# Patient Record
Sex: Male | Born: 1965 | Race: White | Hispanic: No | Marital: Single | Smoking: Current every day smoker
Health system: Southern US, Community
[De-identification: ages and names within clinical notes are randomized; demographics above are authoritative.]

## PROBLEM LIST (undated history)

## (undated) DIAGNOSIS — I6529 Occlusion and stenosis of unspecified carotid artery: Secondary | ICD-10-CM

## (undated) DIAGNOSIS — K219 Gastro-esophageal reflux disease without esophagitis: Secondary | ICD-10-CM

## (undated) DIAGNOSIS — I639 Cerebral infarction, unspecified: Secondary | ICD-10-CM

## (undated) DIAGNOSIS — N200 Calculus of kidney: Secondary | ICD-10-CM

## (undated) DIAGNOSIS — Z720 Tobacco use: Secondary | ICD-10-CM

## (undated) DIAGNOSIS — I10 Essential (primary) hypertension: Secondary | ICD-10-CM

## (undated) DIAGNOSIS — Z87442 Personal history of urinary calculi: Secondary | ICD-10-CM

## (undated) HISTORY — PX: UMBILICAL HERNIA REPAIR: SHX196

## (undated) HISTORY — DX: Cerebral infarction, unspecified: I63.9

## (undated) HISTORY — DX: Calculus of kidney: N20.0

## (undated) HISTORY — DX: Tobacco use: Z72.0

## (undated) HISTORY — DX: Occlusion and stenosis of unspecified carotid artery: I65.29

---

## 1998-09-14 ENCOUNTER — Observation Stay (HOSPITAL_COMMUNITY): Admission: RE | Admit: 1998-09-14 | Discharge: 1998-09-15 | Payer: Self-pay | Admitting: *Deleted

## 2013-07-06 ENCOUNTER — Other Ambulatory Visit: Payer: Self-pay | Admitting: Internal Medicine

## 2013-07-06 DIAGNOSIS — R1031 Right lower quadrant pain: Secondary | ICD-10-CM

## 2013-07-08 ENCOUNTER — Ambulatory Visit
Admission: RE | Admit: 2013-07-08 | Discharge: 2013-07-08 | Disposition: A | Discharge status: Home / Self Care | Payer: PRIVATE HEALTH INSURANCE | Source: Ambulatory Visit | Attending: Internal Medicine | Admitting: Internal Medicine

## 2013-07-08 DIAGNOSIS — R1031 Right lower quadrant pain: Secondary | ICD-10-CM

## 2013-07-08 MED ORDER — IOHEXOL 300 MG/ML  SOLN
100.0000 mL | Freq: Once | INTRAMUSCULAR | Status: AC | PRN
Start: 2013-07-08 — End: 2013-07-08
  Administered 2013-07-08: 100 mL via INTRAVENOUS

## 2013-07-10 ENCOUNTER — Encounter: Payer: Self-pay | Admitting: Internal Medicine

## 2013-07-28 ENCOUNTER — Other Ambulatory Visit: Payer: Self-pay | Admitting: Urology

## 2013-08-21 NOTE — Patient Instructions (Signed)
Landis Gandydward D Fedorko  08/21/2013   Your procedure is scheduled on: 09/07/2013  1200noon-328pm   [facility]  Follow the Signs to Short Stay Center at   1000     am  Call this number if you have problems the morning of surgery: [phone]   Remember:   Do not eat food or drink liquids after midnight.   Take these medicines the morning of surgery with A SIP OF WATER:    Do not wear jewelry,  Do not wear lotions, powders, or perfumes.  . Men may shave face and neck.  Do not bring valuables to the hospital.  Contacts, dentures or bridgework may not be worn into surgery.  Leave suitcase in the car. After surgery it may be brought to your room.  For patients admitted to the hospital, checkout time is 11:00 AM the day of  discharge.      [facility] - Preparing for Surgery Before surgery, you can play an important role.  Because skin is not sterile, your skin needs to be as free of germs as possible.  You can reduce the number of germs on your skin by washing with CHG (chlorahexidine gluconate) soap before surgery.  CHG is an antiseptic cleaner which kills germs and bonds with the skin to continue killing germs even after washing. Please DO NOT use if you have an allergy to CHG or antibacterial soaps.  If your skin becomes reddened/irritated stop using the CHG and inform your nurse when you arrive at Short Stay. Do not shave (including legs and underarms) for at least 48 hours prior to the first CHG shower.  You may shave your face/neck. Please follow these instructions carefully:  1.  Shower with CHG Soap the night before surgery and the  morning of Surgery.  2.  If you choose to wash your hair, wash your hair first as usual with your  normal  shampoo.  3.  After you shampoo, rinse your hair and body thoroughly to remove the  shampoo.                           4.  Use CHG as you would any other liquid soap.  You can apply chg directly  to the skin and wash   Gently with a scrungie or clean washcloth.  5.  Apply the CHG Soap to your body ONLY FROM THE NECK DOWN.   Do not use on face/ open                           Wound or open sores. Avoid contact with eyes, ears mouth and genitals (private parts).                       Wash face,  Genitals (private parts) with your normal soap.             6.  Wash thoroughly, paying special attention to the area where your surgery  will be performed.  7.  Thoroughly rinse your body with warm water from the neck down.  8.  DO NOT shower/wash with your normal soap after using and rinsing off  the CHG Soap.                9.  Pat yourself dry with a clean towel.  10.  Wear clean pajamas.            11.  Place clean sheets on your bed the night of your first shower and do not  sleep with pets. Day of Surgery : Do not apply any lotions/deodorants the morning of surgery.  Please wear clean clothes to the hospital/surgery center.  FAILURE TO FOLLOW THESE INSTRUCTIONS MAY RESULT IN THE CANCELLATION OF YOUR SURGERY PATIENT SIGNATURE_________________________________  NURSE SIGNATURE__________________________________  ________________________________________________________________________

## 2013-08-24 ENCOUNTER — Ambulatory Visit (INDEPENDENT_AMBULATORY_CARE_PROVIDER_SITE_OTHER): Payer: PRIVATE HEALTH INSURANCE | Admitting: Internal Medicine

## 2013-08-24 ENCOUNTER — Other Ambulatory Visit: Payer: Self-pay

## 2013-08-24 ENCOUNTER — Encounter (HOSPITAL_COMMUNITY)
Admission: RE | Admit: 2013-08-24 | Discharge: 2013-08-24 | Disposition: A | Discharge status: Home / Self Care | Payer: PRIVATE HEALTH INSURANCE | Source: Ambulatory Visit | Attending: Urology | Admitting: Urology

## 2013-08-24 ENCOUNTER — Encounter (HOSPITAL_COMMUNITY): Payer: Self-pay

## 2013-08-24 ENCOUNTER — Encounter (INDEPENDENT_AMBULATORY_CARE_PROVIDER_SITE_OTHER): Payer: Self-pay

## 2013-08-24 ENCOUNTER — Encounter: Payer: Self-pay | Admitting: Internal Medicine

## 2013-08-24 ENCOUNTER — Encounter (HOSPITAL_COMMUNITY): Payer: Self-pay | Admitting: Pharmacy Technician

## 2013-08-24 VITALS — BP 150/100 | HR 100 | Ht 64.0 in | Wt 159.1 lb

## 2013-08-24 DIAGNOSIS — K648 Other hemorrhoids: Secondary | ICD-10-CM

## 2013-08-24 DIAGNOSIS — R030 Elevated blood-pressure reading, without diagnosis of hypertension: Secondary | ICD-10-CM

## 2013-08-24 DIAGNOSIS — Z01818 Encounter for other preprocedural examination: Secondary | ICD-10-CM | POA: Insufficient documentation

## 2013-08-24 DIAGNOSIS — IMO0001 Reserved for inherently not codable concepts without codable children: Secondary | ICD-10-CM

## 2013-08-24 DIAGNOSIS — N200 Calculus of kidney: Secondary | ICD-10-CM

## 2013-08-24 DIAGNOSIS — R1031 Right lower quadrant pain: Secondary | ICD-10-CM

## 2013-08-24 DIAGNOSIS — Z01812 Encounter for preprocedural laboratory examination: Secondary | ICD-10-CM | POA: Insufficient documentation

## 2013-08-24 HISTORY — DX: Gastro-esophageal reflux disease without esophagitis: K21.9

## 2013-08-24 LAB — CBC
HCT: 49.8 % (ref 39.0–52.0)
Hemoglobin: 17.4 g/dL — ABNORMAL HIGH (ref 13.0–17.0)
MCH: 33.7 pg (ref 26.0–34.0)
MCHC: 34.9 g/dL (ref 30.0–36.0)
MCV: 96.5 fL (ref 78.0–100.0)
PLATELETS: 223 10*3/uL (ref 150–400)
RBC: 5.16 MIL/uL (ref 4.22–5.81)
RDW: 12.1 % (ref 11.5–15.5)
WBC: 12.1 10*3/uL — ABNORMAL HIGH (ref 4.0–10.5)

## 2013-08-24 LAB — BASIC METABOLIC PANEL
BUN: 13 mg/dL (ref 6–23)
CO2: 23 mEq/L (ref 19–32)
Calcium: 9.2 mg/dL (ref 8.4–10.5)
Chloride: 107 mEq/L (ref 96–112)
Creatinine, Ser: 0.93 mg/dL (ref 0.50–1.35)
Glucose, Bld: 121 mg/dL — ABNORMAL HIGH (ref 70–99)
POTASSIUM: 4.3 meq/L (ref 3.7–5.3)
Sodium: 143 mEq/L (ref 137–147)

## 2013-08-24 MED ORDER — HYDROCORTISONE ACETATE 25 MG RE SUPP: 25.0000 mg | Freq: Every day | RECTAL | Status: DC

## 2013-08-24 NOTE — Patient Instructions (Addendum)
You have been given a separate informational sheet regarding your tobacco use, the importance of quitting and local resources to help you quit.  Please pick up the hemorrhoid suppository prescription at your pharmacy.  Call to make an appointment to see me (I wold like to see you in August) after you have your kidney stone surgery.  I think you should have the hemorrhoids banded and you might need a colonoscopy if banding does not stop the bleeding.  Your blood pressure was high today - could be from pain. Please follow-up with your regular doctor about this.  I appreciate the opportunity to care for you. Iva Booparl E. Gessner, MD, [facility]  Hemorrhoids  Hemorrhoids are swollen veins around the rectum or anus. There are two types of hemorrhoids:  Internal hemorrhoids. These occur in the veins just inside the rectum. They may poke through to the outside and become irritated and painful.  External hemorrhoids. These occur in the veins outside the anus and can be felt as a painful swelling or hard lump near the anus. CAUSES  Pregnancy.  Obesity.  Constipation or diarrhea.  Straining to have a bowel movement.  Sitting for long periods on the toilet.  Heavy lifting or other activity that caused you to strain.  Anal intercourse. SYMPTOMS  Pain.  Anal itching or irritation.  Rectal bleeding.  Fecal leakage.  Anal swelling.  One or more lumps around the anus.  DIAGNOSIS  Your caregiver may be able to diagnose hemorrhoids by visual examination. Other examinations or tests that may be performed include:  Examination of the rectal area with a gloved hand (digital rectal exam).  Examination of anal canal using a small tube (scope).  A blood test if you have lost a significant amount of blood.  A test to look inside the colon (sigmoidoscopy or colonoscopy). TREATMENT  Most hemorrhoids can be treated at home. However, if symptoms do not seem to be getting better or if you have a lot of rectal  bleeding, your caregiver may perform a procedure to help make the hemorrhoids get smaller or remove them completely. Possible treatments include:  Placing a rubber band at the base of the hemorrhoid to cut off the circulation (rubber band ligation).  Injecting a chemical to shrink the hemorrhoid (sclerotherapy).  Using a tool to burn the hemorrhoid (infrared light therapy).  Surgically removing the hemorrhoid (hemorrhoidectomy).  Stapling the hemorrhoid to block blood flow to the tissue (hemorrhoid stapling).  HOME CARE INSTRUCTIONS  Eat foods with fiber, such as whole grains, beans, nuts, fruits, and vegetables. Ask your doctor about taking products with added fiber in them (fiber supplements).  Increase fluid intake. Drink enough water and fluids to keep your urine clear or pale yellow.  Exercise regularly.  Go to the bathroom when you have the urge to have a bowel movement. Do not wait.  Avoid straining to have bowel movements.  Keep the anal area dry and clean. Use wet toilet paper or moist towelettes after a bowel movement.  Medicated creams and suppositories may be used or applied as directed.  Only take over-the-counter or prescription medicines as directed by your caregiver.  Take warm sitz baths for 15-20 minutes, 3-4 times a day to ease pain and discomfort.  Place ice packs on the hemorrhoids if they are tender and swollen. Using ice packs between sitz baths may be helpful.  Put ice in a plastic bag.  Place a towel between your skin and the bag.  Leave the ice  on for 15-20 minutes, 3-4 times a day.  Do not use a donut-shaped pillow or sit on the toilet for long periods. This increases blood pooling and pain.  SEEK MEDICAL CARE IF:  You have increasing pain and swelling that is not controlled by treatment or medicine.  You have uncontrolled bleeding.  You have difficulty or you are unable to have a bowel movement.  You have pain or inflammation outside the area of the  hemorrhoids. MAKE SURE YOU:  Understand these instructions.  Will watch your condition.  Will get help right away if you are not doing well or get worse. Document Released: 02/17/2000 Document Revised: 02/06/2012 Document Reviewed: 12/25/2011  [facility] Patient Information 2015 [employer] [place]. This information is not intended to replace advice given to you by your health care Donavin Audino. Make sure you discuss any questions you have with your health care Jersee Winiarski.

## 2013-08-24 NOTE — Progress Notes (Signed)
CBC results faxed via EPIC to Dr Berneice HeinrichManny,

## 2013-08-24 NOTE — Progress Notes (Addendum)
At beginning of preop appointment blood pressure was 172/120.  At end of preop visit blood pressure was 170/105.  Patient states when he has been to doctors before blood pressure is elevated.  " No one has ever put me on any blood pressure medication. ".  Patient states blood pressure has been 180's /130s.   Patient does not have a PCP.   Blood pressure after venipuncture was 170/105.  After patient was placed in a room with the lights off and quiet- blood pressure was 181/108 in right arm and 174/112 in left arm.  Patient was instructed to let Dr Leone PayorGessner 's office be aware of elevated blood pressure readings and to go to the Emergency Room if develops severe headache or has any dizziness or lightheadedness or any change in present status.

## 2013-08-24 NOTE — Progress Notes (Signed)
Subjective:    Patient ID: [name] [name], male    DOB: 11/11/1965, 48 y.o.   MRN: [id number]  HPI The patient is a long history of right lower quadrant pain. It's been getting worse. He is a staghorn calculus of the right kidney. He is due for a procedure to treat that. He also has nausea vomiting and diarrhea with this at times. He has some chronic intermittent rectal bleeding and he is protruding hemorrhoids. 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  He saw his primary care physician in may, he was describing more diarrhea at that time and he is now. He will get the spells of febrile right lower quadrant pain, him times feverish and chills as well. I reviewed Dr. Alphonsus SiasHolwerda's note.  No Known Allergies Outpatient Prescriptions Prior to Visit  Medication Sig Dispense Refill  . ibuprofen (ADVIL,MOTRIN) 200 MG tablet Take 400 mg by mouth every 6 (six) hours as needed (Pain).      Marland Kitchen. oxyCODONE-acetaminophen (PERCOCET/ROXICET) 5-325 MG per tablet Take 1 tablet by mouth every 4 (four) hours as needed for severe pain (Pain).      Marland Kitchen. tetrahydrozoline 0.05 % ophthalmic solution Place 1 drop into both eyes as needed (Eye allergies).       No facility-administered medications prior to visit.   Past Medical History  Diagnosis Date  . Tobacco abuse   . Nephrolithiasis   . GERD (gastroesophageal reflux disease)    Past Surgical History  Procedure Laterality Date  . Umbilical hernia repair     History   Social History  . Marital Status: Single         Number of Children: 0   Occupational History  . rigging    Social History Main Topics  . Smoking status: Current Every Day Smoker -- 1.00 packs/day for 25 years    Types: Cigarettes  . Smokeless tobacco: Never Used  . Alcohol Use: Yes     Comment: 12 mixed drinks per week   . 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 Prostate cancer Father 372  . Lung cancer Father   . Nephrolithiasis Father   . Ovarian cancer Mother   . CVA Paternal Grandmother     in her 5950's  . Cancer Paternal Grandmother     eye  . Diabetes Maternal Grandmother   . Alcoholism Maternal Uncle     x 3        Review of Systems As per history of present illness. All other review of systems are negative.    Objective:   Physical Exam General:  Well-developed, well-nourished and in no acute distress ENT:   Mouth and posterior pharynx free of lesions.  Neck:   supple w/o thyromegaly or mass.  Lungs: Clear to auscultation bilaterally. Heart:  S1S2, no rubs, murmurs, gallops. Abdomen:  soft, non-tender, no hepatosplenomegaly, hernia, or mass and BS+.  Rectal: Normal anoderm, no mass, normal resting tone, voluntary squeeze. Simulate defecation appropriate. Lymph:  no cervical or supraclavicular adenopathy. Extremities:   no edema Skin   no rash. Neuro:  A&O x 3.  Psych:  appropriate  mood and  Affect.   Anoscopy was performed with the patient in the left lateral decubitus position and revealed Grade 2 internal hemorrhoids all 3 positions. Data Reviewed: PCP note, labs, CT in EMR    Assessment & Plan:   1. Hemorrhoids, internal, with bleeding and Grade 2 prolapse   I believe these are source of rectal bleeding. Byproduct of his heavy lifting on his job.  I have recommended Anusol-HC suppositories for the time being.  Subsequent hemorrhoidal ligation after he has to staghorn calculus procedure.  I've explained that if the bleeding does not resolve with treatment of the hemorrhoids and a colonoscopy will be needed. He understands the importance of followup I've asked him to followup in August. He wants to call to schedule because he travels for work recurrently.   2. RLQ abdominal  pain   3. Staghorn calculus   I think the right lower quadrant pain  may be related to his staghorn calculus. Await procedure for that and followup. May need colonoscopy depending upon response to Tx of stone.  4. Elevated blood pressure   Recheck was improved. He will follow with primary care.    I appreciate the opportunity to care for this patient.Cc:. Dr. Lianne Morisheo [name], Alysia PennaHOLWERDA, SCOTT, MD

## 2013-09-06 MED ORDER — GENTAMICIN SULFATE 40 MG/ML IJ SOLN
360.0000 mg | INTRAVENOUS | Status: AC
  Administered 2013-09-07: 360 mg via INTRAVENOUS
  Filled 2013-09-06: qty 9

## 2013-09-07 ENCOUNTER — Inpatient Hospital Stay (HOSPITAL_COMMUNITY)
Admission: RE | Admit: 2013-09-07 | Discharge: 2013-09-10 | DRG: 661 | Disposition: A | Discharge status: Home / Self Care | Payer: PRIVATE HEALTH INSURANCE | Source: Ambulatory Visit | Attending: Urology | Admitting: Urology

## 2013-09-07 ENCOUNTER — Encounter (HOSPITAL_COMMUNITY): Payer: PRIVATE HEALTH INSURANCE | Admitting: Anesthesiology

## 2013-09-07 ENCOUNTER — Inpatient Hospital Stay (HOSPITAL_COMMUNITY): Payer: PRIVATE HEALTH INSURANCE

## 2013-09-07 ENCOUNTER — Inpatient Hospital Stay (HOSPITAL_COMMUNITY): Payer: PRIVATE HEALTH INSURANCE | Admitting: Anesthesiology

## 2013-09-07 ENCOUNTER — Encounter (HOSPITAL_COMMUNITY)
Admission: RE | Disposition: A | Discharge status: Home / Self Care | Payer: Self-pay | Source: Ambulatory Visit | Attending: Urology

## 2013-09-07 ENCOUNTER — Encounter (HOSPITAL_COMMUNITY): Payer: Self-pay | Admitting: *Deleted

## 2013-09-07 DIAGNOSIS — Z87442 Personal history of urinary calculi: Secondary | ICD-10-CM

## 2013-09-07 DIAGNOSIS — Z833 Family history of diabetes mellitus: Secondary | ICD-10-CM

## 2013-09-07 DIAGNOSIS — Z823 Family history of stroke: Secondary | ICD-10-CM

## 2013-09-07 DIAGNOSIS — Z01812 Encounter for preprocedural laboratory examination: Secondary | ICD-10-CM

## 2013-09-07 DIAGNOSIS — F17200 Nicotine dependence, unspecified, uncomplicated: Secondary | ICD-10-CM | POA: Diagnosis present

## 2013-09-07 DIAGNOSIS — I10 Essential (primary) hypertension: Secondary | ICD-10-CM | POA: Diagnosis present

## 2013-09-07 DIAGNOSIS — Z8042 Family history of malignant neoplasm of prostate: Secondary | ICD-10-CM

## 2013-09-07 DIAGNOSIS — N200 Calculus of kidney: Principal | ICD-10-CM | POA: Diagnosis present

## 2013-09-07 DIAGNOSIS — K219 Gastro-esophageal reflux disease without esophagitis: Secondary | ICD-10-CM | POA: Diagnosis present

## 2013-09-07 DIAGNOSIS — Z801 Family history of malignant neoplasm of trachea, bronchus and lung: Secondary | ICD-10-CM

## 2013-09-07 HISTORY — PX: HOLMIUM LASER APPLICATION: SHX5852

## 2013-09-07 HISTORY — PX: CYSTOSCOPY W/ URETERAL STENT PLACEMENT: SHX1429

## 2013-09-07 HISTORY — PX: NEPHROLITHOTOMY: SHX5134

## 2013-09-07 LAB — TYPE AND SCREEN
ABO/RH(D): O POS
ANTIBODY SCREEN: NEGATIVE

## 2013-09-07 LAB — ABO/RH: ABO/RH(D): O POS

## 2013-09-07 LAB — HEMOGLOBIN AND HEMATOCRIT, BLOOD
HCT: 46.2 % (ref 39.0–52.0)
Hemoglobin: 15.9 g/dL (ref 13.0–17.0)

## 2013-09-07 SURGERY — NEPHROLITHOTOMY PERCUTANEOUS
Anesthesia: General | Laterality: Right

## 2013-09-07 MED ORDER — ACETAMINOPHEN 500 MG PO TABS
1000.0000 mg | ORAL_TABLET | Freq: Three times a day (TID) | ORAL | Status: DC
  Administered 2013-09-07: 1000 mg via ORAL
  Filled 2013-09-07: qty 2

## 2013-09-07 MED ORDER — LORAZEPAM 2 MG/ML IJ SOLN
1.0000 mg | Freq: Four times a day (QID) | INTRAMUSCULAR | Status: DC | PRN
  Administered 2013-09-08: 1 mg via INTRAVENOUS
  Filled 2013-09-07: qty 1

## 2013-09-07 MED ORDER — MIDAZOLAM HCL 5 MG/5ML IJ SOLN
INTRAMUSCULAR | Status: DC | PRN
  Administered 2013-09-07: 2 mg via INTRAVENOUS

## 2013-09-07 MED ORDER — METOPROLOL TARTRATE 1 MG/ML IV SOLN
INTRAVENOUS | Status: AC
  Filled 2013-09-07: qty 5

## 2013-09-07 MED ORDER — KCL IN DEXTROSE-NACL 20-5-0.45 MEQ/L-%-% IV SOLN
INTRAVENOUS | Status: AC
  Filled 2013-09-07: qty 1000

## 2013-09-07 MED ORDER — PROPOFOL 10 MG/ML IV BOLUS
INTRAVENOUS | Status: DC | PRN
  Administered 2013-09-07: 200 mg via INTRAVENOUS

## 2013-09-07 MED ORDER — HYDROMORPHONE HCL PF 1 MG/ML IJ SOLN
INTRAMUSCULAR | Status: AC
  Administered 2013-09-08: 1 mg via INTRAVENOUS
  Filled 2013-09-07: qty 1

## 2013-09-07 MED ORDER — PNEUMOCOCCAL VAC POLYVALENT 25 MCG/0.5ML IJ INJ
0.5000 mL | INJECTION | INTRAMUSCULAR | Status: DC
  Filled 2013-09-07 (×2): qty 0.5

## 2013-09-07 MED ORDER — NEOSTIGMINE METHYLSULFATE 10 MG/10ML IV SOLN
INTRAVENOUS | Status: DC | PRN
  Administered 2013-09-07: 4 mg via INTRAVENOUS

## 2013-09-07 MED ORDER — GLYCOPYRROLATE 0.2 MG/ML IJ SOLN
INTRAMUSCULAR | Status: DC | PRN
  Administered 2013-09-07: 0.6 mg via INTRAVENOUS

## 2013-09-07 MED ORDER — FENTANYL CITRATE 0.05 MG/ML IJ SOLN
INTRAMUSCULAR | Status: AC
  Filled 2013-09-07: qty 2

## 2013-09-07 MED ORDER — GLYCOPYRROLATE 0.2 MG/ML IJ SOLN
INTRAMUSCULAR | Status: AC
  Filled 2013-09-07: qty 3

## 2013-09-07 MED ORDER — IOHEXOL 300 MG/ML  SOLN
INTRAMUSCULAR | Status: DC | PRN
  Administered 2013-09-07: 16 mL
  Administered 2013-09-07: 28 mL

## 2013-09-07 MED ORDER — PROMETHAZINE HCL 25 MG/ML IJ SOLN: 6.2500 mg | INTRAMUSCULAR | Status: DC | PRN

## 2013-09-07 MED ORDER — SODIUM CHLORIDE 0.9 % IJ SOLN
INTRAMUSCULAR | Status: AC
  Filled 2013-09-07: qty 10

## 2013-09-07 MED ORDER — KCL IN DEXTROSE-NACL 20-5-0.45 MEQ/L-%-% IV SOLN
INTRAVENOUS | Status: DC
  Administered 2013-09-07 – 2013-09-08 (×3): via INTRAVENOUS
  Administered 2013-09-09: 75 mL/h via INTRAVENOUS
  Administered 2013-09-09 – 2013-09-10 (×2): via INTRAVENOUS
  Filled 2013-09-07 (×7): qty 1000

## 2013-09-07 MED ORDER — LABETALOL HCL 5 MG/ML IV SOLN
INTRAVENOUS | Status: DC | PRN
  Administered 2013-09-07 (×2): 5 mg via INTRAVENOUS

## 2013-09-07 MED ORDER — NEOSTIGMINE METHYLSULFATE 10 MG/10ML IV SOLN
INTRAVENOUS | Status: AC
  Filled 2013-09-07: qty 1

## 2013-09-07 MED ORDER — LABETALOL HCL 5 MG/ML IV SOLN
INTRAVENOUS | Status: AC
  Filled 2013-09-07: qty 4

## 2013-09-07 MED ORDER — FENTANYL CITRATE 0.05 MG/ML IJ SOLN
INTRAMUSCULAR | Status: DC | PRN
  Administered 2013-09-07: 100 ug via INTRAVENOUS
  Administered 2013-09-07 (×5): 50 ug via INTRAVENOUS

## 2013-09-07 MED ORDER — DOCUSATE SODIUM 100 MG PO CAPS
100.0000 mg | ORAL_CAPSULE | Freq: Two times a day (BID) | ORAL | Status: DC
Start: 2013-09-07 — End: 2013-09-10
  Administered 2013-09-07 – 2013-09-10 (×6): 100 mg via ORAL
  Filled 2013-09-07 (×7): qty 1

## 2013-09-07 MED ORDER — LIDOCAINE HCL (CARDIAC) 20 MG/ML IV SOLN
INTRAVENOUS | Status: DC | PRN
  Administered 2013-09-07: 100 mg via INTRAVENOUS

## 2013-09-07 MED ORDER — HYDROMORPHONE HCL PF 2 MG/ML IJ SOLN
INTRAMUSCULAR | Status: AC
  Filled 2013-09-07: qty 1

## 2013-09-07 MED ORDER — EPHEDRINE SULFATE 50 MG/ML IJ SOLN
INTRAMUSCULAR | Status: AC
  Filled 2013-09-07: qty 1

## 2013-09-07 MED ORDER — HYDROMORPHONE HCL PF 1 MG/ML IJ SOLN
INTRAMUSCULAR | Status: DC | PRN
  Administered 2013-09-07 (×2): 1 mg via INTRAVENOUS

## 2013-09-07 MED ORDER — HYDROMORPHONE HCL PF 1 MG/ML IJ SOLN
0.2500 mg | INTRAMUSCULAR | Status: DC | PRN
  Administered 2013-09-07 (×4): 0.5 mg via INTRAVENOUS

## 2013-09-07 MED ORDER — ONDANSETRON HCL 4 MG/2ML IJ SOLN: 4.0000 mg | INTRAMUSCULAR | Status: DC | PRN

## 2013-09-07 MED ORDER — HYDROMORPHONE HCL PF 1 MG/ML IJ SOLN
0.5000 mg | INTRAMUSCULAR | Status: DC | PRN
  Administered 2013-09-07 – 2013-09-10 (×20): 1 mg via INTRAVENOUS
  Filled 2013-09-07 (×21): qty 1

## 2013-09-07 MED ORDER — PHENYLEPHRINE 40 MCG/ML (10ML) SYRINGE FOR IV PUSH (FOR BLOOD PRESSURE SUPPORT)
PREFILLED_SYRINGE | INTRAVENOUS | Status: AC
  Filled 2013-09-07: qty 10

## 2013-09-07 MED ORDER — ONDANSETRON HCL 4 MG/2ML IJ SOLN
INTRAMUSCULAR | Status: DC | PRN
  Administered 2013-09-07: 4 mg via INTRAVENOUS

## 2013-09-07 MED ORDER — OXYCODONE HCL 5 MG PO TABS
5.0000 mg | ORAL_TABLET | ORAL | Status: DC | PRN
Start: 2013-09-07 — End: 2013-09-08
  Administered 2013-09-07: 5 mg via ORAL
  Filled 2013-09-07: qty 1

## 2013-09-07 MED ORDER — DEXAMETHASONE SODIUM PHOSPHATE 10 MG/ML IJ SOLN
INTRAMUSCULAR | Status: AC
  Filled 2013-09-07: qty 1

## 2013-09-07 MED ORDER — ADULT MULTIVITAMIN W/MINERALS CH
1.0000 | ORAL_TABLET | Freq: Every day | ORAL | Status: DC
  Administered 2013-09-07 – 2013-09-10 (×4): 1 via ORAL
  Filled 2013-09-07 (×5): qty 1

## 2013-09-07 MED ORDER — SODIUM CHLORIDE 0.9 % IR SOLN
Status: DC | PRN
  Administered 2013-09-07: 6000 mL
  Administered 2013-09-07 (×2): 3000 mL
  Administered 2013-09-07: 6000 mL
  Administered 2013-09-07: 3000 mL

## 2013-09-07 MED ORDER — VITAMIN B-1 100 MG PO TABS
100.0000 mg | ORAL_TABLET | Freq: Every day | ORAL | Status: DC
  Administered 2013-09-07 – 2013-09-10 (×4): 100 mg via ORAL
  Filled 2013-09-07 (×5): qty 1

## 2013-09-07 MED ORDER — DEXAMETHASONE SODIUM PHOSPHATE 10 MG/ML IJ SOLN
INTRAMUSCULAR | Status: DC | PRN
  Administered 2013-09-07: 10 mg via INTRAVENOUS

## 2013-09-07 MED ORDER — FOLIC ACID 1 MG PO TABS
1.0000 mg | ORAL_TABLET | Freq: Every day | ORAL | Status: DC
  Administered 2013-09-07 – 2013-09-10 (×4): 1 mg via ORAL
  Filled 2013-09-07 (×5): qty 1

## 2013-09-07 MED ORDER — THIAMINE HCL 100 MG/ML IJ SOLN
100.0000 mg | Freq: Every day | INTRAMUSCULAR | Status: DC
  Filled 2013-09-07 (×4): qty 1

## 2013-09-07 MED ORDER — METOPROLOL TARTRATE 25 MG PO TABS
25.0000 mg | ORAL_TABLET | Freq: Two times a day (BID) | ORAL | Status: DC
  Administered 2013-09-07 – 2013-09-10 (×6): 25 mg via ORAL
  Filled 2013-09-07 (×9): qty 1

## 2013-09-07 MED ORDER — LORAZEPAM 1 MG PO TABS: 1.0000 mg | ORAL_TABLET | Freq: Four times a day (QID) | ORAL | Status: DC | PRN

## 2013-09-07 MED ORDER — PHENYLEPHRINE HCL 10 MG/ML IJ SOLN
INTRAMUSCULAR | Status: DC | PRN
  Administered 2013-09-07 (×2): 40 ug via INTRAVENOUS

## 2013-09-07 MED ORDER — MIDAZOLAM HCL 2 MG/2ML IJ SOLN
INTRAMUSCULAR | Status: AC
  Filled 2013-09-07: qty 2

## 2013-09-07 MED ORDER — METOPROLOL TARTRATE 1 MG/ML IV SOLN
2.5000 mg | INTRAVENOUS | Status: AC | PRN
  Administered 2013-09-07 (×2): 2.5 mg via INTRAVENOUS

## 2013-09-07 MED ORDER — ROCURONIUM BROMIDE 100 MG/10ML IV SOLN
INTRAVENOUS | Status: DC | PRN
  Administered 2013-09-07 (×2): 10 mg via INTRAVENOUS
  Administered 2013-09-07: 50 mg via INTRAVENOUS
  Administered 2013-09-07 (×2): 10 mg via INTRAVENOUS

## 2013-09-07 MED ORDER — SENNA 8.6 MG PO TABS
1.0000 | ORAL_TABLET | Freq: Two times a day (BID) | ORAL | Status: DC
  Administered 2013-09-07 – 2013-09-10 (×6): 8.6 mg via ORAL
  Filled 2013-09-07 (×6): qty 1

## 2013-09-07 MED ORDER — FENTANYL CITRATE 0.05 MG/ML IJ SOLN
INTRAMUSCULAR | Status: AC
  Filled 2013-09-07: qty 5

## 2013-09-07 MED ORDER — HYDROMORPHONE HCL PF 1 MG/ML IJ SOLN
INTRAMUSCULAR | Status: AC
  Filled 2013-09-07: qty 1

## 2013-09-07 MED ORDER — ATROPINE SULFATE 0.4 MG/ML IJ SOLN
INTRAMUSCULAR | Status: AC
  Filled 2013-09-07: qty 1

## 2013-09-07 MED ORDER — PROPOFOL 10 MG/ML IV BOLUS
INTRAVENOUS | Status: AC
  Filled 2013-09-07: qty 20

## 2013-09-07 MED ORDER — LACTATED RINGERS IV SOLN
INTRAVENOUS | Status: DC
  Administered 2013-09-07: 13:00:00 via INTRAVENOUS
  Administered 2013-09-07: 1000 mL via INTRAVENOUS

## 2013-09-07 SURGICAL SUPPLY — 66 items
APL SKNCLS STERI-STRIP NONHPOA (GAUZE/BANDAGES/DRESSINGS) ×4
BAG URINE DRAINAGE (UROLOGICAL SUPPLIES) ×8 IMPLANT
BASKET ZERO TIP NITINOL 2.4FR (BASKET) ×4 IMPLANT
BENZOIN TINCTURE PRP APPL 2/3 (GAUZE/BANDAGES/DRESSINGS) ×8 IMPLANT
BLADE SURG 15 STRL LF DISP TIS (BLADE) ×2 IMPLANT
BLADE SURG 15 STRL SS (BLADE) ×4
BSKT STON RTRVL ZERO TP 2.4FR (BASKET) ×2
CATCHER STONE W/TUBE ADAPTER (UROLOGICAL SUPPLIES) ×2 IMPLANT
CATH BEACON 5.038 65CM KMP-01 (CATHETERS) ×4 IMPLANT
CATH FOLEY 2W COUNCIL 20FR 5CC (CATHETERS) IMPLANT
CATH FOLEY 2WAY SLVR  5CC 16FR (CATHETERS) ×2
CATH FOLEY 2WAY SLVR 5CC 16FR (CATHETERS) ×2 IMPLANT
CATH INTERMIT  6FR 70CM (CATHETERS) ×4 IMPLANT
CATH MULTI PURPOSE 16FR DRAIN (STENTS) ×2 IMPLANT
CATH ROBINSON RED A/P 20FR (CATHETERS) IMPLANT
CATH X-FORCE N30 NEPHROSTOMY (TUBING) ×4 IMPLANT
CHLORAPREP W/TINT 26ML (MISCELLANEOUS) ×6 IMPLANT
COVER SURGICAL LIGHT HANDLE (MISCELLANEOUS) ×4 IMPLANT
DRAPE C-ARM 42X120 X-RAY (DRAPES) ×4 IMPLANT
DRAPE CAMERA CLOSED 9X96 (DRAPES) ×8 IMPLANT
DRAPE LG THREE QUARTER DISP (DRAPES) ×6 IMPLANT
DRAPE LINGEMAN PERC (DRAPES) ×4 IMPLANT
DRAPE SURG IRRIG POUCH 19X23 (DRAPES) ×4 IMPLANT
DRSG PAD ABDOMINAL 8X10 ST (GAUZE/BANDAGES/DRESSINGS) ×8 IMPLANT
DRSG TEGADERM 8X12 (GAUZE/BANDAGES/DRESSINGS) ×6 IMPLANT
FIBER LASER FLEXIVA 1000 (UROLOGICAL SUPPLIES) IMPLANT
FIBER LASER FLEXIVA 365 (UROLOGICAL SUPPLIES) ×2 IMPLANT
FIBER LASER FLEXIVA 550 (UROLOGICAL SUPPLIES) IMPLANT
GAUZE SPONGE 4X4 12PLY STRL (GAUZE/BANDAGES/DRESSINGS) ×3 IMPLANT
GLOVE BIOGEL M STRL SZ7.5 (GLOVE) ×12 IMPLANT
GOWN STRL REUS W/TWL XL LVL3 (GOWN DISPOSABLE) ×14 IMPLANT
GUIDEWIRE AMPLAZ .035X145 (WIRE) ×8 IMPLANT
GUIDEWIRE ANG ZIPWIRE 038X150 (WIRE) ×6 IMPLANT
GUIDEWIRE STR DUAL SENSOR (WIRE) ×4 IMPLANT
IV NS IRRIG 3000ML ARTHROMATIC (IV SOLUTION) ×8 IMPLANT
KIT BASIN OR (CUSTOM PROCEDURE TRAY) ×4 IMPLANT
MANIFOLD NEPTUNE II (INSTRUMENTS) ×4 IMPLANT
NDL TROCAR 18X15 ECHO (NEEDLE) IMPLANT
NDL TROCAR 18X20 (NEEDLE) IMPLANT
NEEDLE TROCAR 18X15 ECHO (NEEDLE) ×4 IMPLANT
NEEDLE TROCAR 18X20 (NEEDLE) ×4 IMPLANT
NS IRRIG 1000ML POUR BTL (IV SOLUTION) ×4 IMPLANT
PACK BASIC VI WITH GOWN DISP (CUSTOM PROCEDURE TRAY) ×4 IMPLANT
PACK CYSTO (CUSTOM PROCEDURE TRAY) ×4 IMPLANT
PAD ABD 8X10 STRL (GAUZE/BANDAGES/DRESSINGS) ×2 IMPLANT
PROBE LITHOCLAST ULTRA 3.8X403 (UROLOGICAL SUPPLIES) ×2 IMPLANT
PROBE PNEUMATIC 1.0MMX570MM (UROLOGICAL SUPPLIES) ×4 IMPLANT
SET IRRIG Y TYPE TUR BLADDER L (SET/KITS/TRAYS/PACK) ×4 IMPLANT
SET WARMING FLUID IRRIGATION (MISCELLANEOUS) IMPLANT
SHEATH PEELAWAY SET 9 (SHEATH) ×4 IMPLANT
SPONGE GAUZE 4X4 12PLY (GAUZE/BANDAGES/DRESSINGS) ×2 IMPLANT
SPONGE LAP 4X18 X RAY DECT (DISPOSABLE) ×4 IMPLANT
STENT CONTOUR 6FRX24X.038 (STENTS) ×2 IMPLANT
STONE CATCHER W/TUBE ADAPTER (UROLOGICAL SUPPLIES) ×4 IMPLANT
STOPCOCK 4 WAY LG BORE MALE ST (IV SETS) ×2 IMPLANT
SUT SILK 1 TIES 10/18 (SUTURE) ×2 IMPLANT
SUT SILK 2 0 30  PSL (SUTURE) ×4
SUT SILK 2 0 30 PSL (SUTURE) ×2 IMPLANT
SYRINGE 12CC LL (MISCELLANEOUS) ×4 IMPLANT
SYRINGE 20CC LL (MISCELLANEOUS) ×8 IMPLANT
SYRINGE 60CC LL (MISCELLANEOUS) ×2 IMPLANT
TOWEL OR 17X26 10 PK STRL BLUE (TOWEL DISPOSABLE) ×4 IMPLANT
TUBE CONNECTING VINYL 14FR 30C (MISCELLANEOUS) ×2 IMPLANT
TUBING CONNECTING 10 (TUBING) ×9 IMPLANT
TUBING CONNECTING 10' (TUBING) ×3
WATER STERILE IRR 1500ML POUR (IV SOLUTION) ×4 IMPLANT

## 2013-09-07 NOTE — Transfer of Care (Signed)
Immediate Anesthesia Transfer of Care Note  Patient: [name] [name]  Procedure(s) Performed: Procedure(s) (LRB): FIRST STAGE RIGHT PERCUTANEOUS NEPHROLITHOTOMY  WITH ACCESS,  (Right) HOLMIUM LASER APPLICATION (Right) CYSTOSCOPY WITH RETROGRADE PYELOGRAM/URETERAL STENT PLACEMENT (Bilateral)  Patient Location: PACU  Anesthesia Type: General  Level of Consciousness: sedated, patient cooperative and responds to stimulation  Airway & Oxygen Therapy: Patient Spontanous Breathing and Patient connected to face mask oxgen  Post-op Assessment: Report given to PACU RN and Post -op Vital signs reviewed and stable  Post vital signs: Reviewed and stable  Complications: No apparent anesthesia complications

## 2013-09-07 NOTE — OR Nursing (Signed)
Sacral dressing not applicable to this case. It was removed before start of surgery.

## 2013-09-07 NOTE — Progress Notes (Signed)
Dr Denenny notified of patient's elevated BP 

## 2013-09-07 NOTE — Brief Op Note (Signed)
09/07/2013  3:42 PM  PATIENT:  Landis GandyEdward D Edds  48 y.o. male  PRE-OPERATIVE DIAGNOSIS:  RIGHT GREATER THAN LEFT KIDNEY STONES  POST-OPERATIVE DIAGNOSIS:  bilateral kidney stones  PROCEDURE:  Procedure(s): FIRST STAGE RIGHT PERCUTANEOUS NEPHROLITHOTOMY  WITH ACCESS,  (Right) HOLMIUM LASER APPLICATION (Right) CYSTOSCOPY WITH RETROGRADE PYELOGRAM/URETERAL STENT PLACEMENT (Bilateral)  SURGEON:  Surgeon(s) and Role:    * Sebastian Acheheodore Angelette Ganus, MD - Primary  PHYSICIAN ASSISTANT:   ASSISTANTS: none   ANESTHESIA:   general  EBL:  Total I/O In: 1000 [I.V.:1000] Out: 500 [Blood:500]  BLOOD ADMINISTERED:none  DRAINS: Rt nephrostomy to drain, Rt nephroureteral stent (capped), Foley to drain   LOCAL MEDICATIONS USED:  NONE  SPECIMEN:  Source of Specimen:  Rt Renal Stones  DISPOSITION OF SPECIMEN:  [facility] for compositional Analysis  COUNTS:  YES  TOURNIQUET:  * No tourniquets in log *  DICTATION: .Other Dictation: Dictation Number [id number]  PLAN OF CARE: Admit to inpatient   PATIENT DISPOSITION:  PACU - hemodynamically stable.   Delay start of Pharmacological VTE agent (>24hrs) due to surgical blood loss or risk of bleeding: yes

## 2013-09-07 NOTE — H&P (Signed)
[name] [name] is an 48 y.o. male.    Chief Complaint: Pre-Op Right First Stage Percutaneous Nephrostolithotomy, Bilateral Stent Placement  HPI:   1 - Recurrent Nephrolithiasis -  Pre 2015 ureteroscopy x 2 06/2013 - CT Rt 2.5cm lower pole parital staghorn stone with some mild intrarenal hydro and 6mm left sided renal stone.  2 - Medical Stone Disease -  Eval 2015: BMP, PTH, Urate - normal; Composition - pending; 24 Hr Urines - pending  PMH sig for severe HTN (SBP 200's no adrenal masses on imaging, now established with PCP). No CV disease. No strong blood thinners. Most recent UCX negative.   Past Medical History  Diagnosis Date  . Tobacco abuse   . Nephrolithiasis   . GERD (gastroesophageal reflux disease)     Past Surgical History  Procedure Laterality Date  . Umbilical hernia repair      Family History  Problem Relation Age of Onset  . Prostate cancer Father 2072  . Lung cancer Father   . Nephrolithiasis Father   . Ovarian cancer Mother   . CVA Paternal Grandmother     in her 2250's  . Cancer Paternal Grandmother     eye  . Diabetes Maternal Grandmother   . Alcoholism Maternal Uncle     x 3   Social History:  reports that he has been smoking Cigarettes.  He has a 25 pack-year smoking history. He has never used smokeless tobacco. He reports that he drinks alcohol. He reports that he uses illicit drugs (Marijuana).  Allergies: No Known Allergies  No prescriptions prior to admission    No results found for this or any previous visit (from the past 48 hour(s)). No results found.  Review of Systems  Constitutional: Negative.  Negative for fever and chills.  HENT: Negative.   Eyes: Negative.   Respiratory: Negative.   Cardiovascular: Negative.   Gastrointestinal: Negative.   Genitourinary: Positive for flank pain.  Musculoskeletal: Negative.   Skin: Negative.   Neurological: Negative.   Endo/Heme/Allergies: Negative.   Psychiatric/Behavioral: Negative.      There were no vitals taken for this visit. Physical Exam  Constitutional: He is oriented to person, place, and time. He appears well-developed and well-nourished.  HENT:  Head: Normocephalic and atraumatic.  Eyes: EOM are normal. Pupils are equal, round, and reactive to light.  Neck: Normal range of motion.  Cardiovascular: Normal rate.   Respiratory: Effort normal.  GI: Soft. Bowel sounds are normal.  Genitourinary: Penis normal.  Musculoskeletal: Normal range of motion.  Neurological: He is alert and oriented to person, place, and time.  Skin: Skin is warm.  Psychiatric: He has a normal mood and affect. His behavior is normal. Judgment and thought content normal.     Assessment/Plan  1 - Recurrent Nephrolithiasis -  Pt wants to have bilateral "clean out" beginning today. I feel this is certainly reasonable. Will  proceed with staged left ureteroscopy / right percutaneous nephrolithotomy as 2 surgeries. Will focus o right 1st stage PCNL, left stent today, then right 2nd stage PCNL / left URS Wednesday.    We rediscussed percutaneous nephrostolithotomy (PCNL) in detail including need for percutaneous access which is sometimes gained by the surgeon, and other times by radiology or through existing nephrostomy tubes if present. We specifically rediscussed that often times tubes remain in after surgery until we are confident all stone has been treated. We rementioned that staged surgery is needed in over 50% of cases of very large or complex  stone. We then rediscussed general risks including bleeding, infection, damage to kidney / ureter / bladder, loss of kidney, as well as anesthetic risks and rare but serious surgical complications including DVT, PE, MI, and mortality.   2 - Medical Stone Disease -composition and 24 hr urines to follow   Pahola Dimmitt 09/07/2013, 6:14 AM

## 2013-09-07 NOTE — Anesthesia Preprocedure Evaluation (Signed)
Anesthesia Evaluation  Patient identified by MRN, date of birth, ID band Patient awake    Reviewed: Allergy & Precautions, H&P , NPO status , Patient's Chart, lab work & pertinent test results  Airway Mallampati: II TM Distance: >3 FB Neck ROM: Full    Dental no notable dental hx.    Pulmonary Current Smoker,  breath sounds clear to auscultation  Pulmonary exam normal       Cardiovascular negative cardio ROS  Rhythm:Regular Rate:Normal     Neuro/Psych negative neurological ROS  negative psych ROS   GI/Hepatic negative GI ROS, Neg liver ROS,   Endo/Other  negative endocrine ROS  Renal/GU negative Renal ROS  negative genitourinary   Musculoskeletal negative musculoskeletal ROS (+)   Abdominal   Peds negative pediatric ROS (+)  Hematology negative hematology ROS (+)   Anesthesia Other Findings   Reproductive/Obstetrics negative OB ROS                          Anesthesia Physical Anesthesia Plan  ASA: II  Anesthesia Plan: General   Post-op Pain Management:    Induction: Intravenous  Airway Management Planned: Oral ETT  Additional Equipment:   Intra-op Plan:   Post-operative Plan: Extubation in OR  Informed Consent: I have reviewed the patients History and Physical, chart, labs and discussed the procedure including the risks, benefits and alternatives for the proposed anesthesia with the patient or authorized representative who has indicated his/her understanding and acceptance.   Dental advisory given  Plan Discussed with: CRNA and Surgeon  Anesthesia Plan Comments:         Anesthesia Quick Evaluation  

## 2013-09-08 ENCOUNTER — Encounter (HOSPITAL_COMMUNITY): Payer: Self-pay | Admitting: Urology

## 2013-09-08 LAB — BASIC METABOLIC PANEL
ANION GAP: 14 (ref 5–15)
BUN: 14 mg/dL (ref 6–23)
CALCIUM: 9.2 mg/dL (ref 8.4–10.5)
CO2: 26 mEq/L (ref 19–32)
CREATININE: 1.05 mg/dL (ref 0.50–1.35)
Chloride: 94 mEq/L — ABNORMAL LOW (ref 96–112)
GFR calc non Af Amer: 82 mL/min — ABNORMAL LOW (ref >90)
Glucose, Bld: 151 mg/dL — ABNORMAL HIGH (ref 70–99)
Potassium: 5 mEq/L (ref 3.7–5.3)
Sodium: 134 mEq/L — ABNORMAL LOW (ref 137–147)

## 2013-09-08 LAB — HEMOGLOBIN AND HEMATOCRIT, BLOOD
HCT: 45.3 % (ref 39.0–52.0)
HEMOGLOBIN: 15.6 g/dL (ref 13.0–17.0)

## 2013-09-08 MED ORDER — ACETAMINOPHEN 10 MG/ML IV SOLN
1000.0000 mg | Freq: Four times a day (QID) | INTRAVENOUS | Status: AC | PRN
  Administered 2013-09-08: 1000 mg via INTRAVENOUS
  Filled 2013-09-08: qty 100

## 2013-09-08 MED ORDER — GENTAMICIN SULFATE 40 MG/ML IJ SOLN
360.0000 mg | INTRAVENOUS | Status: AC
  Administered 2013-09-09: 360 mg via INTRAVENOUS
  Filled 2013-09-08: qty 9

## 2013-09-08 MED ORDER — OXYCODONE HCL 5 MG PO TABS
5.0000 mg | ORAL_TABLET | ORAL | Status: DC | PRN
  Administered 2013-09-08 – 2013-09-09 (×4): 10 mg via ORAL
  Administered 2013-09-10: 5 mg via ORAL
  Filled 2013-09-08 (×6): qty 2

## 2013-09-08 NOTE — Progress Notes (Signed)
ANTIBIOTIC CONSULT NOTE - INITIAL  Pharmacy Consult for Gentamicin Indication: Pre-op dosing  No Known Allergies  Patient Measurements: Height: 5\' 5"  (165.1 cm) Weight: 159 lb 2 oz (72.179 kg) IBW/kg (Calculated) : 61.5 Adjusted weight:   Vital Signs: Temp: 98 F (36.7 C) (07/07 1331) Temp src: Oral (07/07 1331) BP: 141/99 mmHg (07/07 1331) Pulse Rate: 91 (07/07 1331) Intake/Output from previous day: 07/06 0701 - 07/07 0700 In: 3343.8 [P.O.:240; I.V.:3003.8; IV Piggyback:100] Out: 2510 [Urine:2010; Blood:500]  Labs:  Recent Labs  09/07/13 1557 09/08/13 0428  HGB 15.9 15.6  CREATININE  --  1.05   Estimated Creatinine Clearance: 74.8 ml/min (by C-G formula based on Cr of 1.05).   Assessment: 89 yoM admitted 7/6 with recurrent nephrolithiasis s/p first stage of stone removal procedure on 7/6.  Noted plans for second stage procedure on 7/8 and pharmacy is consulted to dose gentamicin for pre-op antibiotic.  Tmax: afebrile  WBCs: (none recent)  Renal: SCr 1.05, CrCl ~ 75 ml/min  Goal of Therapy:  Gentamicin trough level <2 mcg/ml  Plan:   Gentamicin 360mg  IV once, prior to procedure on 7/8.  Lynann Beaverhristine Bellagrace Sylvan PharmD, BCPS Pager [phone] 09/08/2013 3:26 PM

## 2013-09-08 NOTE — Progress Notes (Signed)
1 Day Post-Op  Subjective:  1 - Recurrent Nephrolithiasis - s/p 1st stage stone procedure 7/6 with left ureteral stent, right 1st stage percuteneous nephrostolithtoomy at which time approx 70% right sided stone addressed.   Today [name] [name]'s most bothered by sensation of bladder fullness / foley discomfort. Hgb and Cr stable. No fevers. NO nausea / emesis.    Objective: Vital signs in last 24 hours: Temp:  [97.6 F (36.4 C)-98.4 F (36.9 C)] 98 F (36.7 C) (07/07 0539) Pulse Rate:  [69-101] 77 (07/07 0539) Resp:  [10-24] 18 (07/07 0539) BP: (145-178)/(95-115) 177/106 mmHg (07/07 0539) SpO2:  [98 %-100 %] 100 % (07/07 0539) Weight:  [72.179 kg (159 lb 2 oz)] 72.179 kg (159 lb 2 oz) (07/06 1023) Last BM Date: 09/07/13  Intake/Output from previous day: 07/06 0701 - 07/07 0700 In: 3343.8 [P.O.:240; I.V.:3003.8; IV Piggyback:100] Out: 2510 [Urine:2010; Blood:500] Intake/Output this shift: Total I/O In: -  Out: 500 [Urine:500]  General appearance: alert, cooperative and appears stated age Head: Normocephalic, without obvious abnormality, atraumatic Nose: Nares normal. Septum midline. Mucosa normal. No drainage or sinus tenderness. Throat: lips, mucosa, and tongue normal; teeth and gums normal Neck: supple, symmetrical, trachea midline Back: symmetric, no curvature. ROM normal. No CVA tenderness. Resp: non-labored Chest wall: no tenderness Cardio: Nl rate GI: soft, non-tender; bowel sounds normal; no masses,  no organomegaly Male genitalia: normal, foley removed Extremities: extremities normal, atraumatic, no cyanosis or edema Pulses: 2+ and symmetric Skin: Skin color, texture, turgor normal. No rashes or lesions Lymph nodes: Cervical, supraclavicular, and axillary nodes normal. Neurologic: Grossly normal Incision/Wound: Rt flank with nephrostomy tueb with light pink drainage. No large hematoma / ecchymoses.   Lab Results:   Recent Labs  09/07/13 1557 09/08/13 0428  HGB  15.9 15.6  HCT 46.2 45.3   BMET  Recent Labs  09/08/13 0428  NA 134*  K 5.0  CL 94*  CO2 26  GLUCOSE 151*  BUN 14  CREATININE 1.05  CALCIUM 9.2   PT/INR No results found for this basename: LABPROT, INR,  in the last 72 hours ABG No results found for this basename: PHART, PCO2, PO2, HCO3,  in the last 72 hours  Studies/Results: Dg Abd 1 View  09/07/2013   CLINICAL DATA:  Kidney stone.  EXAM: INTRAOPERATIVE RIGHT PERCUTANEOUS NEPHROLITHOTOMY  FLUOROSCOPY TIME:  12 min and 19 seconds.  FINDINGS: C-ARM FILMS DOCUMENT PLACEMENT OF A PIGTAIL CATHETER IN A LOWER POLE COLLECTING SYSTEM ON THE RIGHT. A DOUBLE-J STENT WAS PLACED FROM THE COLLECTING SYSTEM INTO THE BLADDER.  IMPRESSION: Right percutaneous nephrolithotomy with ureteral stent placement.   Electronically Signed   By: [name] [name] M.D.   On: 09/07/2013 16:05   Dg Retrograde Pyelogram  09/07/2013   CLINICAL DATA:  Kidney stone  EXAM: INTRAOPERATIVE BILATERAL RETROGRADE UROGRAPHY  TECHNIQUE: Images were obtained with the C-arm fluoroscopic device intraoperatively and submitted for interpretation post-operatively. Please see the procedural report for the amount of contrast and the fluoroscopy time utilized.  COMPARISON:  None.  FINDINGS: Images demonstrate cannulation of the ureteral orifices and contrast for pyelography bilaterally. A left double-J ureteral stent has been placed. There is narrowing at the right ureteropelvic junction.  IMPRESSION: See above.   Electronically Signed   By: [name] [name] M.D.   On: 09/07/2013 12:58   Dg C-arm 1-60 Min-no Report  09/07/2013   CLINICAL DATA:  Kidney stone.  EXAM: INTRAOPERATIVE RIGHT PERCUTANEOUS NEPHROLITHOTOMY  FLUOROSCOPY TIME:  12 min and 19 seconds.  FINDINGS:  C-ARM FILMS DOCUMENT PLACEMENT OF A PIGTAIL CATHETER IN A LOWER POLE COLLECTING SYSTEM ON THE RIGHT. A DOUBLE-J STENT WAS PLACED FROM THE COLLECTING SYSTEM INTO THE BLADDER.  IMPRESSION: Right percutaneous nephrolithotomy with ureteral  stent placement.   Electronically Signed   By: [name] [name] M.D.   On: 09/07/2013 16:05    Anti-infectives: Anti-infectives   Start     Dose/Rate Route Frequency Ordered Stop   09/07/13 0600  gentamicin (GARAMYCIN) 360 mg in dextrose 5 % 100 mL IVPB     360 mg 109 mL/hr over 60 Minutes Intravenous 30 min pre-op 09/06/13 1459 09/07/13 1225      Assessment/Plan:  1 - Recurrent Nephrolithiasis - s/p 1st stage procedure yesterday. Plan for 2nd stage procedure tomorrow with 2nd stage right PCNL and left ureteroscopy / stone manipulation. NPO p MN. Consent.  [name] [name] LLCMANNY, Brad [name] 09/08/2013

## 2013-09-08 NOTE — Progress Notes (Addendum)
Pt c/o extreme pain 10/10 despite 1mg  Dilaudid q2 hours. Pt hypertensive at this time. This RN paged on call urologist. Dr. Margarita GrizzleWoodruff gave verbal orders, RN entered into computer. Will continue to monitor pt closely. [name] [name], Anesha Hackert I

## 2013-09-08 NOTE — Op Note (Signed)
[name]:  [name] [name], [name] [name]                ACCOUNT NO.:  [id number]  MEDICAL RECORD NO.:  [id number]  LOCATION:  1404                         [facility]  PHYSICIAN:  [name] Acheheodore Levi Klaiber, MD     DATE OF BIRTH:  [birth year]  DATE OF PROCEDURE:  09/07/2013 DATE OF DISCHARGE:                              OPERATIVE REPORT   PREOPERATIVE DIAGNOSIS:  Right partial staghorn kidney stone, left lower pole kidney stone.  PROCEDURE: 1. Cystoscopy with bilateral pyelogram interpretation. 2. Insertion of bilateral ureteral stents. 3. Right first stage percutaneous nephrostolithotomy stone greater     than 2 cm. 4. Percutaneous access to the right kidney with dilation of tract. 5. Right antegrade nephrostogram interpretation. 6. Insertion of right nephrostomy tube, 16-French.  ESTIMATED BLOOD LOSS:  500 mL.  COMPLICATIONS:  None.  SPECIMEN:  Right renal stone fragments for compositional analysis.  FINDINGS: 1. Lower pole filling defect, relatively bifid kidney with minimal     renal pelvis on right retrograde pyelogram. 2. Unremarkable left retrograde pyelogram. 3. Placement of left 6 x 24 Contour stent. 4. Removal of approximately 75% of right renal stone burden. 5. Unremarkable final right antegrade nephrostogram with no     extravasation of contrast noted.  SURGEON:  [name] Acheheodore Junior Kenedy, MD.  INDICATION:  [name] [name] is a pleasant 48 year old gentleman with a history of nephrolithiasis, found on workup of abdominal pain to have right greater than left nephrolithiasis, the right partial staghorn kidney stone.  Options were discussed for management including unilateral versus bilateral definitive management, and he wished to proceed with bilateral staged intervention of the right percutaneous nephrostolithotomy, left ureteroscopic stone manipulation, with first staged repair today addressing this predominantly right-sided stone and presenting as left sided.  Informed consent was obtained and  placed in medical record.  PROCEDURE IN DETAIL:  Patient being [name] Hammandward [name] verified, procedure being right first stage left stent placement was confirmed.  Procedure was carried out.  Time-out was performed.  Intravenous antibiotics were administered.  General endotracheal anesthesia was introduced.  The patient was initially placed into a low lithotomy position.  Sterile field was created by prepping and draping the patient's penis, perineum, and proximal thighs using iodine x3.  Next, cystourethroscopy was performed using a 20-French rigid cystoscope with 12-degree offset lens. Inspection of the anterior and posterior urethra was unremarkable. Inspection of bladder revealed no diverticula, calcifications, papular lesions.  The left ureter was cannulated with a 6-French catheter and left retrograde pyelogram was obtained.  Left retrograde pyelogram demonstrates a single left ureter, single system left kidney.  No filling defects or narrowing were noted.  A 0.038 Glidewire was advanced at the level of the upper pole, over which a new 6 x 24 Contour stent was placed, good proximal and distal deployments were noted.  Attention was then directed to the right side.  The right ureteral orifice was cannulated with a 6-French end-hole catheter and right retrograde pyelogram was obtained.  Right retrograde pyelogram demonstrates a single right ureter, single system right kidney.  There was a large filling defect in the lower pole with contrast persistent, consistent with a known partial staghorn stone.  The kidney was somewhat bifid in appearance  with stone situated in the lower pole.  There was minimal hydronephrosis.  There was very minimal renal pelvis.  Sensor wire was advanced at the level of lower pole, and the open-ended catheter was advanced to the level of renal pelvis, adding as an externalized nephroureteral catheter.  A 16-French Foley catheter was placed per urethra, straight  drain in the nephroureteral catheter was fashioned to this and iodinated contrast primed, and extension tubing was connected to this.  Patient then was placed into complete prone position employing prone view, axillary rolls, chest rolls, padding of his knees and ankles, compression devices, approximately 10 degrees of table flexion to maximize space in his 12th rib and iliac crest. Sterile field was created by prepping and draping the patient's right flank using chlorhexidine gluconate.  Next, using simultaneous retrograde filling of the kidney via the externalized nephroureteral catheter and fluoroscopy, suitable lower pole calyx was identified for percutaneous access using bull's eye technique at 15 degrees of center, as well pole calyx was entered using 18-gauge Chiba needle, was verified by efflux of clear-appearing urine.  The 0.038 Glidewire was advanced and curled at the level of the lower pole and a KMP catheter was advanced in this location.  Multiple angulations of the KMP catheter and Glidewire were used to try to gain through-and-through access down the ureter, however, this was not successful and this was felt to be due to likely stone impaction in the lower pole.  As such, a Super Stiff wire was curled in the level of lower pole, and a decision made to dilate over this.  Percutaneous drape was applied.  The 30-French NephroMax balloon dilation apparatus was carefully placed across this lower pole calyx, inflated to pressure of 16 atmospheres, held for 90 seconds and the sheath was advanced to this location.  Next, rigid nephroscopy was performed.  Rigid nephroscopy revealed excellent sheath placement entering the posterior aspect of the inferior most bifid calyx.  There was some free-floating stone fragments in this location which were grasped and set aside.  However, tracking towards the lower pole infundibulum, the tip of very large stone was seen.  Next,  LithoClast ultrasound pneumatic energy was applied _ for approximately 2 hours ablating the vast majority of it.  Next, using a flexible nephroscopy, the area of the infundibulum and renal pelvis curvature was seen and a ZIPwire was advanced down the level of the ureter and exchanged for a Super Stiff wire via the KMP catheter, thus establishing through-and-through access from the access tract to the level of the urinary bladder.  Additional flexible nephroscopy was performed, and holmium laser energy was applied to the stone using settings of 0.5 joules and 10 Hz fragmenting additional stone.  At this point, the patient was then in prone position approximately 3 hours as stage approach was clearly planned.  I feel the safest way to proceed would be to conclude the procedure today, preparation for next stage day after tomorrow.  The KMP catheter was left in place acting as an externalized nephroureteral stent.  The 0.038 ZIPwire was curled in the level of lower pole over which a new 16-French nephrostomy tube was placed and coiled, and final antegrade nephrostogram was obtained.  Final right antegrade nephrostogram revealed excellent placement of the nephrostomy tube in lower pole calyx.  Free flow of contrast down the level of the ureter without excessive extravasation and absolutely no medial extravasation.  The tubes were fashioned at the level of the skin and mattress stitches applied  for hemostasis.  The nephroureteral stent was capped.  The nephrostomy tube was connected to straight drain.  Procedure was terminated.  The patient tolerated the procedure well.  There were no immediate periprocedural complications. The patient was taken to the postanesthesia care in stable condition.          ______________________________ [name] Ache, MD     TM/MEDQ  D:  09/07/2013  T:  09/08/2013  Job:  [id number]

## 2013-09-09 ENCOUNTER — Inpatient Hospital Stay (HOSPITAL_COMMUNITY): Admit: 2013-09-09 | Payer: PRIVATE HEALTH INSURANCE | Admitting: Urology

## 2013-09-09 ENCOUNTER — Encounter (HOSPITAL_COMMUNITY): Payer: PRIVATE HEALTH INSURANCE | Admitting: Registered Nurse

## 2013-09-09 ENCOUNTER — Inpatient Hospital Stay (HOSPITAL_COMMUNITY): Payer: PRIVATE HEALTH INSURANCE | Admitting: Registered Nurse

## 2013-09-09 ENCOUNTER — Encounter (HOSPITAL_COMMUNITY)
Admission: RE | Disposition: A | Discharge status: Home / Self Care | Payer: Self-pay | Source: Ambulatory Visit | Attending: Urology

## 2013-09-09 ENCOUNTER — Inpatient Hospital Stay (HOSPITAL_COMMUNITY): Payer: PRIVATE HEALTH INSURANCE

## 2013-09-09 ENCOUNTER — Encounter (HOSPITAL_COMMUNITY): Payer: Self-pay | Admitting: Certified Registered Nurse Anesthetist

## 2013-09-09 HISTORY — PX: HOLMIUM LASER APPLICATION: SHX5852

## 2013-09-09 HISTORY — PX: CYSTOSCOPY WITH RETROGRADE PYELOGRAM, URETEROSCOPY AND STENT PLACEMENT: SHX5789

## 2013-09-09 HISTORY — PX: NEPHROLITHOTOMY: SHX5134

## 2013-09-09 SURGERY — NEPHROLITHOTOMY PERCUTANEOUS SECOND LOOK
Anesthesia: General | Laterality: Right

## 2013-09-09 MED ORDER — HYDROMORPHONE HCL PF 1 MG/ML IJ SOLN
0.2500 mg | INTRAMUSCULAR | Status: DC | PRN
  Administered 2013-09-09: 0.5 mg via INTRAVENOUS

## 2013-09-09 MED ORDER — DEXAMETHASONE SODIUM PHOSPHATE 10 MG/ML IJ SOLN
INTRAMUSCULAR | Status: DC | PRN
  Administered 2013-09-09: 10 mg via INTRAVENOUS

## 2013-09-09 MED ORDER — HYDROMORPHONE HCL PF 1 MG/ML IJ SOLN
INTRAMUSCULAR | Status: AC
  Filled 2013-09-09: qty 1

## 2013-09-09 MED ORDER — PROPOFOL 10 MG/ML IV BOLUS
INTRAVENOUS | Status: AC
  Filled 2013-09-09: qty 20

## 2013-09-09 MED ORDER — 0.9 % SODIUM CHLORIDE (POUR BTL) OPTIME
TOPICAL | Status: DC | PRN
  Administered 2013-09-09: 1000 mL

## 2013-09-09 MED ORDER — SODIUM CHLORIDE 0.9 % IR SOLN
Status: DC | PRN
  Administered 2013-09-09: 6000 mL

## 2013-09-09 MED ORDER — LACTATED RINGERS IV SOLN: INTRAVENOUS | Status: DC

## 2013-09-09 MED ORDER — LIDOCAINE HCL (CARDIAC) 20 MG/ML IV SOLN
INTRAVENOUS | Status: DC | PRN
  Administered 2013-09-09: 80 mg via INTRAVENOUS

## 2013-09-09 MED ORDER — LIDOCAINE HCL (CARDIAC) 20 MG/ML IV SOLN
INTRAVENOUS | Status: AC
  Filled 2013-09-09: qty 5

## 2013-09-09 MED ORDER — FENTANYL CITRATE 0.05 MG/ML IJ SOLN
INTRAMUSCULAR | Status: AC
  Filled 2013-09-09: qty 2

## 2013-09-09 MED ORDER — ONDANSETRON HCL 4 MG/2ML IJ SOLN
INTRAMUSCULAR | Status: AC
  Filled 2013-09-09: qty 2

## 2013-09-09 MED ORDER — DEXAMETHASONE SODIUM PHOSPHATE 10 MG/ML IJ SOLN
INTRAMUSCULAR | Status: AC
  Filled 2013-09-09: qty 1

## 2013-09-09 MED ORDER — SODIUM CHLORIDE 0.9 % IV SOLN
INTRAVENOUS | Status: DC | PRN
  Administered 2013-09-09: 16:00:00 via INTRAVENOUS

## 2013-09-09 MED ORDER — PHENYLEPHRINE 40 MCG/ML (10ML) SYRINGE FOR IV PUSH (FOR BLOOD PRESSURE SUPPORT)
PREFILLED_SYRINGE | INTRAVENOUS | Status: AC
  Filled 2013-09-09: qty 20

## 2013-09-09 MED ORDER — SODIUM CHLORIDE 0.9 % IJ SOLN
INTRAMUSCULAR | Status: AC
  Filled 2013-09-09: qty 10

## 2013-09-09 MED ORDER — SODIUM CHLORIDE 0.9 % IR SOLN
Status: DC | PRN
  Administered 2013-09-09: 2000 mL

## 2013-09-09 MED ORDER — PROPOFOL 10 MG/ML IV BOLUS
INTRAVENOUS | Status: DC | PRN
  Administered 2013-09-09: 200 mg via INTRAVENOUS

## 2013-09-09 MED ORDER — FENTANYL CITRATE 0.05 MG/ML IJ SOLN
INTRAMUSCULAR | Status: DC | PRN
  Administered 2013-09-09 (×7): 50 ug via INTRAVENOUS

## 2013-09-09 MED ORDER — LACTATED RINGERS IV SOLN
INTRAVENOUS | Status: DC | PRN
  Administered 2013-09-09 (×2): via INTRAVENOUS

## 2013-09-09 MED ORDER — PHENYLEPHRINE 40 MCG/ML (10ML) SYRINGE FOR IV PUSH (FOR BLOOD PRESSURE SUPPORT)
PREFILLED_SYRINGE | INTRAVENOUS | Status: AC
  Filled 2013-09-09: qty 10

## 2013-09-09 MED ORDER — GENTAMICIN SULFATE 40 MG/ML IJ SOLN: 360.0000 mg | INTRAVENOUS | Status: DC

## 2013-09-09 MED ORDER — IOHEXOL 300 MG/ML  SOLN
INTRAMUSCULAR | Status: DC | PRN
  Administered 2013-09-09: 10 mL

## 2013-09-09 MED ORDER — PHENYLEPHRINE HCL 10 MG/ML IJ SOLN
INTRAMUSCULAR | Status: DC | PRN
  Administered 2013-09-09 (×3): 80 ug via INTRAVENOUS
  Administered 2013-09-09: 120 ug via INTRAVENOUS
  Administered 2013-09-09 (×2): 80 ug via INTRAVENOUS
  Administered 2013-09-09: 40 ug via INTRAVENOUS
  Administered 2013-09-09 (×2): 80 ug via INTRAVENOUS

## 2013-09-09 MED ORDER — MIDAZOLAM HCL 5 MG/5ML IJ SOLN
INTRAMUSCULAR | Status: DC | PRN
  Administered 2013-09-09: 2 mg via INTRAVENOUS

## 2013-09-09 MED ORDER — FENTANYL CITRATE 0.05 MG/ML IJ SOLN
INTRAMUSCULAR | Status: AC
  Filled 2013-09-09: qty 5

## 2013-09-09 MED ORDER — MIDAZOLAM HCL 2 MG/2ML IJ SOLN
INTRAMUSCULAR | Status: AC
  Filled 2013-09-09: qty 2

## 2013-09-09 MED ORDER — SUCCINYLCHOLINE CHLORIDE 20 MG/ML IJ SOLN
INTRAMUSCULAR | Status: DC | PRN
  Administered 2013-09-09: 100 mg via INTRAVENOUS

## 2013-09-09 SURGICAL SUPPLY — 52 items
APL SKNCLS STERI-STRIP NONHPOA (GAUZE/BANDAGES/DRESSINGS) ×2
BAG URINE DRAINAGE (UROLOGICAL SUPPLIES) ×4 IMPLANT
BASKET ZERO TIP NITINOL 2.4FR (BASKET) ×4 IMPLANT
BENZOIN TINCTURE PRP APPL 2/3 (GAUZE/BANDAGES/DRESSINGS) ×10 IMPLANT
BLADE SURG 15 STRL LF DISP TIS (BLADE) ×2 IMPLANT
BLADE SURG 15 STRL SS (BLADE)
BSKT STON RTRVL ZERO TP 2.4FR (BASKET) ×2
CATCHER STONE W/TUBE ADAPTER (UROLOGICAL SUPPLIES) ×2 IMPLANT
CATH FOLEY 2W COUNCIL 20FR 5CC (CATHETERS) IMPLANT
CATH FOLEY 2WAY SLVR  5CC 16FR (CATHETERS) ×2
CATH FOLEY 2WAY SLVR 5CC 16FR (CATHETERS) IMPLANT
CATH ROBINSON RED A/P 20FR (CATHETERS) IMPLANT
CATH X-FORCE N30 NEPHROSTOMY (TUBING) ×2 IMPLANT
COVER SURGICAL LIGHT HANDLE (MISCELLANEOUS) ×2 IMPLANT
DRAPE C-ARM 42X120 X-RAY (DRAPES) ×4 IMPLANT
DRAPE CAMERA CLOSED 9X96 (DRAPES) ×4 IMPLANT
DRAPE LINGEMAN PERC (DRAPES) ×4 IMPLANT
DRAPE SURG IRRIG POUCH 19X23 (DRAPES) ×2 IMPLANT
DRSG PAD ABDOMINAL 8X10 ST (GAUZE/BANDAGES/DRESSINGS) ×4 IMPLANT
DRSG TEGADERM 8X12 (GAUZE/BANDAGES/DRESSINGS) ×6 IMPLANT
FIBER LASER FLEXIVA 1000 (UROLOGICAL SUPPLIES) IMPLANT
FIBER LASER FLEXIVA 200 (UROLOGICAL SUPPLIES) ×2 IMPLANT
FIBER LASER FLEXIVA 550 (UROLOGICAL SUPPLIES) IMPLANT
GAUZE SPONGE 4X4 12PLY STRL (GAUZE/BANDAGES/DRESSINGS) ×4 IMPLANT
GOWN STRL REUS W/TWL XL LVL3 (GOWN DISPOSABLE) ×6 IMPLANT
GUIDEWIRE ANG ZIPWIRE 038X150 (WIRE) ×2 IMPLANT
GUIDEWIRE STR DUAL SENSOR (WIRE) ×2 IMPLANT
KIT BASIN OR (CUSTOM PROCEDURE TRAY) ×4 IMPLANT
MANIFOLD NEPTUNE II (INSTRUMENTS) ×4 IMPLANT
NS IRRIG 1000ML POUR BTL (IV SOLUTION) ×2 IMPLANT
PACK BASIC VI WITH GOWN DISP (CUSTOM PROCEDURE TRAY) ×2 IMPLANT
PACK CYSTO (CUSTOM PROCEDURE TRAY) ×2 IMPLANT
PROBE LITHOCLAST ULTRA 3.8X403 (UROLOGICAL SUPPLIES) ×2 IMPLANT
PROBE PNEUMATIC 1.0MMX570MM (UROLOGICAL SUPPLIES) ×2 IMPLANT
SET IRRIG Y TYPE TUR BLADDER L (SET/KITS/TRAYS/PACK) ×4 IMPLANT
SET WARMING FLUID IRRIGATION (MISCELLANEOUS) ×2 IMPLANT
SHEATH ACCESS URETERAL 38CM (SHEATH) ×2 IMPLANT
SPONGE GAUZE 4X4 12PLY (GAUZE/BANDAGES/DRESSINGS) ×1 IMPLANT
SPONGE LAP 4X18 X RAY DECT (DISPOSABLE) ×4 IMPLANT
STENT CONTOUR 6FRX24X.038 (STENTS) ×4 IMPLANT
STONE CATCHER W/TUBE ADAPTER (UROLOGICAL SUPPLIES) IMPLANT
SURGIFLO W/THROMBIN 8M KIT (HEMOSTASIS) ×2 IMPLANT
SUT SILK 2 0 30  PSL (SUTURE)
SUT SILK 2 0 30 PSL (SUTURE) ×2 IMPLANT
SUT VIC AB 3-0 SH 27 (SUTURE) ×4
SUT VIC AB 3-0 SH 27X BRD (SUTURE) IMPLANT
SYRINGE 12CC LL (MISCELLANEOUS) ×2 IMPLANT
SYRINGE 20CC LL (MISCELLANEOUS) ×6 IMPLANT
TOWEL OR NON WOVEN STRL DISP B (DISPOSABLE) ×4 IMPLANT
TRAY FOLEY CATH 14FRSI W/METER (CATHETERS) ×2 IMPLANT
TUBING CONNECTING 10 (TUBING) ×7 IMPLANT
TUBING CONNECTING 10' (TUBING) ×1

## 2013-09-09 NOTE — Progress Notes (Signed)
2 Days Post-Op   Subjective:  1 - Recurrent Nephrolithiasis - s/p 1st stage stone procedure 7/6 with left ureteral stent, right 1st stage percuteneous nephrostolithtoomy at which time approx 70% right sided stone addressed.   Today [name] [name]'s most bothered by sensation of bladder fullness / foley discomfort. Hgb and Cr stable. No fevers. NO nausea / emesis.   Objective: Vital signs in last 24 hours: Temp:  [97.8 F (36.6 C)-98.4 F (36.9 C)] 97.9 F (36.6 C) (07/08 0555) Pulse Rate:  [84-91] 90 (07/08 0555) Resp:  [16-18] 18 (07/08 0555) BP: (123-160)/(90-104) 160/90 mmHg (07/08 0555) SpO2:  [94 %-97 %] 94 % (07/08 0555) Last BM Date: 09/07/13  Intake/Output from previous day: 07/07 0701 - 07/08 0700 In: 2595 [P.O.:1020; I.V.:1575] Out: 4350 [Urine:4350] Intake/Output this shift: Total I/O In: -  Out: 175 [Urine:175]  General appearance: alert, cooperative and appears stated age Head: Normocephalic, without obvious abnormality, atraumatic Nose: Nares normal. Septum midline. Mucosa normal. No drainage or sinus tenderness. Throat: lips, mucosa, and tongue normal; teeth and gums normal Neck: supple, symmetrical, trachea midline Back: symmetric, no curvature. ROM normal. No CVA tenderness. Resp: non-labored Chest wall: no tenderness Cardio: regular rate and rhythm, S1, S2 normal, no murmur, click, rub or gallop GI: soft, non-tender; bowel sounds normal; no masses,  no organomegaly Male genitalia: normal Extremities: extremities normal, atraumatic, no cyanosis or edema Pulses: 2+ and symmetric Skin: Skin color, texture, turgor normal. No rashes or lesions Neurologic: Grossly normal Incision/Wound: Rt neph tube c/d/i with light pink urine. No large flank ecchymoses.  Lab Results:   Recent Labs  09/07/13 1557 09/08/13 0428  HGB 15.9 15.6  HCT 46.2 45.3   BMET  Recent Labs  09/08/13 0428  NA 134*  K 5.0  CL 94*  CO2 26  GLUCOSE 151*  BUN 14  CREATININE 1.05   CALCIUM 9.2   PT/INR No results found for this basename: LABPROT, INR,  in the last 72 hours ABG No results found for this basename: PHART, PCO2, PO2, HCO3,  in the last 72 hours  Studies/Results: Dg Abd 1 View  09/07/2013   CLINICAL DATA:  Kidney stone.  EXAM: INTRAOPERATIVE RIGHT PERCUTANEOUS NEPHROLITHOTOMY  FLUOROSCOPY TIME:  12 min and 19 seconds.  FINDINGS: C-ARM FILMS DOCUMENT PLACEMENT OF A PIGTAIL CATHETER IN A LOWER POLE COLLECTING SYSTEM ON THE RIGHT. A DOUBLE-J STENT WAS PLACED FROM THE COLLECTING SYSTEM INTO THE BLADDER.  IMPRESSION: Right percutaneous nephrolithotomy with ureteral stent placement.   Electronically Signed   By: Davonna BellingJohn  Curnes M.D.   On: 09/07/2013 16:05   Dg Retrograde Pyelogram  09/07/2013   CLINICAL DATA:  Kidney stone  EXAM: INTRAOPERATIVE BILATERAL RETROGRADE UROGRAPHY  TECHNIQUE: Images were obtained with the C-arm fluoroscopic device intraoperatively and submitted for interpretation post-operatively. Please see the procedural report for the amount of contrast and the fluoroscopy time utilized.  COMPARISON:  None.  FINDINGS: Images demonstrate cannulation of the ureteral orifices and contrast for pyelography bilaterally. A left double-J ureteral stent has been placed. There is narrowing at the right ureteropelvic junction.  IMPRESSION: See above.   Electronically Signed   By: Maryclare BeanArt  Hoss M.D.   On: 09/07/2013 12:58   Dg C-arm 1-60 Min-no Report  09/07/2013   CLINICAL DATA:  Kidney stone.  EXAM: INTRAOPERATIVE RIGHT PERCUTANEOUS NEPHROLITHOTOMY  FLUOROSCOPY TIME:  12 min and 19 seconds.  FINDINGS: C-ARM FILMS DOCUMENT PLACEMENT OF A PIGTAIL CATHETER IN A LOWER POLE COLLECTING SYSTEM ON THE RIGHT. A DOUBLE-J STENT WAS  PLACED FROM THE COLLECTING SYSTEM INTO THE BLADDER.  IMPRESSION: Right percutaneous nephrolithotomy with ureteral stent placement.   Electronically Signed   By: Davonna BellingJohn  Curnes M.D.   On: 09/07/2013 16:05    Anti-infectives: Anti-infectives   Start      Dose/Rate Route Frequency Ordered Stop   09/09/13 0600  gentamicin (GARAMYCIN) 360 mg in dextrose 5 % 100 mL IVPB     360 mg 218 mL/hr over 30 Minutes Intravenous 60 min pre-op 09/08/13 1528     09/07/13 0600  gentamicin (GARAMYCIN) 360 mg in dextrose 5 % 100 mL IVPB     360 mg 109 mL/hr over 60 Minutes Intravenous 30 min pre-op 09/06/13 1459 09/07/13 1225      Assessment/Plan:  1 - Recurrent Nephrolithiasis - s/p 1st stage procedure 7/6. Plan for 2nd stage procedure today as planned with 2nd stage right PCNL and left ureteroscopy / stone manipulation. Peri-op ABX ordered. Risks / benefits as before and outlined again with patient today.  [facility] Spive[facility] StationMANNY, [name] [name] 09/09/2013

## 2013-09-09 NOTE — Progress Notes (Signed)
Patient back from OR-PACU, alert/awake and oriented foley inplace draining pinkish clear yellow urine. Patient walked to the bathroom per patient request for bowel movement but with no result. No distress noted. Will continue to assess patient.

## 2013-09-09 NOTE — Brief Op Note (Signed)
09/07/2013 - 09/09/2013  5:47 PM  PATIENT:  [name] [name]  48 y.o. male  PRE-OPERATIVE DIAGNOSIS:  RIGHT GREATER THAN LEFT KIDNEY STONES  POST-OPERATIVE DIAGNOSIS:  RIGHT GREATER THAN LEFT KIDNEY STONES  PROCEDURE:  Procedure(s): RIGHT 2ND STAGE NEPHROLITHOTOMY PERCUTANEOUS (Right) HOLMIUM LASER APPLICATION (Right) CYSTOSCOPY WITH RETROGRADE PYELOGRAM, DIAGNOSTIC URETEROSCOPY AND STENT CHANGE (Bilateral)  SURGEON:  Surgeon(s) and Role:    * Sebastian Acheheodore Zakery Normington, MD - Primary  PHYSICIAN ASSISTANT:   ASSISTANTS: none   ANESTHESIA:   general  EBL:  Total I/O In: 2600 [I.V.:2600] Out: 695 [Urine:695]  BLOOD ADMINISTERED:none  DRAINS: foley catheter to straight drain   LOCAL MEDICATIONS USED:  NONE  SPECIMEN:  Source of Specimen:  residual Rt renal stones  DISPOSITION OF SPECIMEN:  [facility] for compositional analysis  COUNTS:  YES  TOURNIQUET:  * No tourniquets in log *  DICTATION: .Other Dictation: Dictation Number [id number]  PLAN OF CARE: Admit to inpatient   PATIENT DISPOSITION:  PACU - hemodynamically stable.   Delay start of Pharmacological VTE agent (>24hrs) due to surgical blood loss or risk of bleeding: yes

## 2013-09-09 NOTE — Progress Notes (Signed)
Gentamycin is ordered 60 min prior to procedure.  It is on my MAR for 0600 but pt not having sx until noon and [facility]  I have talked to Alvino Chapelllen in pharmacy who states she will change it so that it will not be on my MAR and so it will be communicated to Pre-op that it is their responsibility to give.

## 2013-09-09 NOTE — Anesthesia Postprocedure Evaluation (Signed)
  Anesthesia Post-op Note  Patient: [name] [name]  Procedure(s) Performed: Procedure(s) (LRB): RIGHT 2ND STAGE NEPHROLITHOTOMY PERCUTANEOUS (Right) HOLMIUM LASER APPLICATION (Right) CYSTOSCOPY WITH RETROGRADE PYELOGRAM, DIAGNOSTIC URETEROSCOPY AND STENT CHANGE (Bilateral)  Patient Location: PACU  Anesthesia Type: General  Level of Consciousness: awake and alert   Airway and Oxygen Therapy: Patient Spontanous Breathing  Post-op Pain: mild  Post-op Assessment: Post-op Vital signs reviewed, Patient's Cardiovascular Status Stable, Respiratory Function Stable, Patent Airway and No signs of Nausea or vomiting  Last Vitals:  Filed Vitals:   09/09/13 1828  BP: 141/91  Pulse: 98  Temp: 36.4 C  Resp:     Post-op Vital Signs: stable   Complications: No apparent anesthesia complications

## 2013-09-09 NOTE — Transfer of Care (Signed)
Immediate Anesthesia Transfer of Care Note  Patient: [name] [name]  Procedure(s) Performed: Procedure(s): RIGHT 2ND STAGE NEPHROLITHOTOMY PERCUTANEOUS (Right) HOLMIUM LASER APPLICATION (Right) CYSTOSCOPY WITH RETROGRADE PYELOGRAM, DIAGNOSTIC URETEROSCOPY AND STENT CHANGE (Bilateral)  Patient Location: PACU  Anesthesia Type:General  Level of Consciousness: Patient easily awoken, sedated, comfortable, cooperative, following commands, responds to stimulation.   Airway & Oxygen Therapy: Patient spontaneously breathing, ventilating well, oxygen via simple oxygen mask.  Post-op Assessment: Report given to PACU RN, vital signs reviewed and stable, moving all extremities.   Post vital signs: Reviewed and stable.  Complications: No apparent anesthesia complications

## 2013-09-09 NOTE — Progress Notes (Signed)
PCN Dressing is saturated and is now leaking so it has been reinforced.   Has had 100cc's of lighter red urine into PCN in the last 3 hours and alomost no clots.  VSS and pain remains 6/10 before pain meds and falls asleep after pain meds.

## 2013-09-09 NOTE — Anesthesia Preprocedure Evaluation (Addendum)
Anesthesia Evaluation  Patient identified by MRN, date of birth, ID band Patient awake    Reviewed: Allergy & Precautions, H&P , NPO status , Patient's Chart, lab work & pertinent test results  Airway Mallampati: II  TM Distance: >3 FB Neck ROM: full    Dental no notable dental hx. (+) Teeth Intact, Dental Advisory Given   Pulmonary neg pulmonary ROS, Current Smoker,  breath sounds clear to auscultation  Pulmonary exam normal       Cardiovascular Exercise Tolerance: Good negative cardio ROS  Rhythm:regular Rate:Normal     Neuro/Psych negative neurological ROS  negative psych ROS   GI/Hepatic negative GI ROS, Neg liver ROS,   Endo/Other  negative endocrine ROS  Renal/GU negative Renal ROS  negative genitourinary   Musculoskeletal   Abdominal   Peds  Hematology negative hematology ROS (+)   Anesthesia Other Findings   Reproductive/Obstetrics negative OB ROS                            Anesthesia Physical Anesthesia Plan  ASA: II  Anesthesia Plan: General   Post-op Pain Management:    Induction: Intravenous  Airway Management Planned: Oral ETT  Additional Equipment:   Intra-op Plan:   Post-operative Plan: Extubation in OR  Informed Consent: I have reviewed the patients History and Physical, chart, labs and discussed the procedure including the risks, benefits and alternatives for the proposed anesthesia with the patient or authorized representative who has indicated his/her understanding and acceptance.   Dental Advisory Given  Plan Discussed with: CRNA and Surgeon  Anesthesia Plan Comments:         Anesthesia Quick Evaluation  

## 2013-09-09 NOTE — Progress Notes (Addendum)
Large, wormlike, thick clots noted in PCN tubing, clogging up tube.   Has had 250cc drainage from R PCN in past 4 hours and regular urine of 475cc's.  Dressing over RPCN at 1900 mostly saturated.  Is completely saturated now but not leaking or requiring reinforcement.   Changed just the distal  Tubing/bag DT long clot blocking drainage.  Now draining again into bag.  Unable to drain original bag DT clots and have left the bag in BR for MD to see.  Pain for the pt has been 6/10 consistently since 1900 and will go down to 3/10 (or pt falls asleep) after pain meds.  No change in pain at this time is still 6/10 and I have re-medicated him at this time.  I have spoken to Dr. Marlou PorchHerrick to ask if I need to flush PCN tubing or get a CBC in AM.  He states no need for either at this time.  Will continue to monitor. VSS

## 2013-09-10 ENCOUNTER — Encounter (HOSPITAL_COMMUNITY): Payer: Self-pay | Admitting: Urology

## 2013-09-10 LAB — BASIC METABOLIC PANEL
ANION GAP: 12 (ref 5–15)
BUN: 14 mg/dL (ref 6–23)
CALCIUM: 8.9 mg/dL (ref 8.4–10.5)
CHLORIDE: 97 meq/L (ref 96–112)
CO2: 26 mEq/L (ref 19–32)
Creatinine, Ser: 1.32 mg/dL (ref 0.50–1.35)
GFR calc non Af Amer: 62 mL/min — ABNORMAL LOW (ref >90)
GFR, EST AFRICAN AMERICAN: 72 mL/min — AB (ref >90)
Glucose, Bld: 153 mg/dL — ABNORMAL HIGH (ref 70–99)
Potassium: 4.6 mEq/L (ref 3.7–5.3)
SODIUM: 135 meq/L — AB (ref 137–147)

## 2013-09-10 LAB — HEMOGLOBIN AND HEMATOCRIT, BLOOD
HEMATOCRIT: 39.5 % (ref 39.0–52.0)
Hemoglobin: 13.6 g/dL (ref 13.0–17.0)

## 2013-09-10 MED ORDER — SULFAMETHOXAZOLE-TMP DS 800-160 MG PO TABS: 1.0000 | ORAL_TABLET | Freq: Two times a day (BID) | ORAL | Status: DC

## 2013-09-10 MED ORDER — HYDROMORPHONE HCL 2 MG PO TABS: 2.0000 mg | ORAL_TABLET | ORAL | Status: DC | PRN

## 2013-09-10 MED ORDER — SENNOSIDES-DOCUSATE SODIUM 8.6-50 MG PO TABS: 1.0000 | ORAL_TABLET | Freq: Two times a day (BID) | ORAL | Status: DC

## 2013-09-10 NOTE — Care Management Note (Signed)
    Page 1 of 1   09/10/2013     11:07:00 AM CARE MANAGEMENT NOTE 09/10/2013  Patient:  [name] [name],[name] [name]   Account Number:  [id number]  Date Initiated:  09/08/2013  Documentation initiated by:  [name] ClamMAHABIR,[name] [name]  Subjective/Objective Assessment:   48 Y/O M ADMITTED W/RECURRENT NEPHROLITHIASIS.     Action/Plan:   FROM HOME.   Anticipated DC Date:  09/10/2013   Anticipated DC Plan:  HOME/SELF CARE      DC Planning Services  CM consult      Choice offered to / List presented to:             Status of service:  Completed, signed off Medicare Important Message given?   (If response is "NO", the following Medicare IM given date fields will be blank) Date Medicare IM given:   Medicare IM given by:   Date Additional Medicare IM given:   Additional Medicare IM given by:    Discharge Disposition:  HOME/SELF CARE  Per UR Regulation:  Reviewed for med. necessity/level of care/duration of stay  If discussed at Long Length of Stay Meetings, dates discussed:    Comments:  09/09/13 [name] Chirico RN,[name]  09/08/13 [name] Wadhwa RN,[name] [name]/C NEEDS.

## 2013-09-10 NOTE — Anesthesia Postprocedure Evaluation (Signed)
  Anesthesia Post-op Note  Patient: Landis Gandydward D Schwager  Procedure(s) Performed: Procedure(s) (LRB): FIRST STAGE RIGHT PERCUTANEOUS NEPHROLITHOTOMY  WITH ACCESS,  (Right) HOLMIUM LASER APPLICATION (Right) CYSTOSCOPY WITH RETROGRADE PYELOGRAM/URETERAL STENT PLACEMENT (Bilateral)  Patient Location: PACU  Anesthesia Type: General  Level of Consciousness: awake and alert   Airway and Oxygen Therapy: Patient Spontanous Breathing  Post-op Pain: mild  Post-op Assessment: Post-op Vital signs reviewed, Patient's Cardiovascular Status Stable, Respiratory Function Stable, Patent Airway and No signs of Nausea or vomiting  Last Vitals:  Filed Vitals:   09/10/13 0522  BP: 146/108  Pulse: 84  Temp: 37 C  Resp: 20    Post-op Vital Signs: stable   Complications: No apparent anesthesia complications

## 2013-09-10 NOTE — Progress Notes (Signed)
Patient discharged home, discharge instructions given and explained to patient and he verbalized understanding, denies any distress. Incision site clean/dry/intact, dry dressing applied, no sign of infection noted. Accompanied home by family.

## 2013-09-10 NOTE — Discharge Summary (Signed)
Physician Discharge Summary  Patient ID: [name] [name] MRN: [id number] DOB/AGE: [birth year] 48 y.o.  Admit date: 09/07/2013 Discharge date: 09/10/2013  Admission Diagnoses: Rt Staghorn, Lt Renal Stones  Discharge Diagnoses:  Active Problems:   Staghorn calculus   Discharged Condition: good  Hospital Course:   1 - Recurrent Nephrolithiasis - s/p 1st stage stone procedure 7/6 with left ureteral stent, right 1st stage percuteneous nephrostolithtoomy at which time approx 70% right sided stone addressed.  2nd stage procedure 7/8 with right second stage percutaneous nephrostolithtomy, bilateral diagnostic ureteroscopy and stent change to stone free. Small left lower pole stone seen on CT not intraluminal and likely parenchymal.  By 7/9, the day of discharge, pt ambulatory, tolerating PO, voiding well, afebrile, pain controlled, and felt to be adequate for discharge without any externalized tubes.    Consults: None  Significant Diagnostic Studies: labs: Hgb >13, Cr <1.5 at discharge.  Treatments: surgery:  1st stage stone procedure 7/6 with left ureteral stent, right 1st stage percuteneous nephrostolithtoomy at which time approx 70% right sided stone addressed.  2nd stage procedure 7/8 with right second stage percutaneous nephrostolithtomy, bilateral diagnostic ureteroscopy and stent change to stone free  Discharge Exam: Blood pressure 146/108, pulse 84, temperature 98.6 F (37 C), temperature source Oral, resp. rate 20, height 5\' 5"  (1.651 m), weight 72.179 kg (159 lb 2 oz), SpO2 99.00%. General appearance: alert, cooperative and appears stated age Head: Normocephalic, without obvious abnormality, atraumatic Nose: Nares normal. Septum midline. Mucosa normal. No drainage or sinus tenderness. Throat: lips, mucosa, and tongue normal; teeth and gums normal Neck: supple, symmetrical, trachea midline Back: symmetric, no curvature. ROM normal. No CVA tenderness. Resp: non-labored on room air Chest  wall: no tenderness Cardio: Nl rate GI: soft, non-tender; bowel sounds normal; no masses,  no organomegaly Male genitalia: normal Extremities: extremities normal, atraumatic, no cyanosis or edema Pulses: 2+ and symmetric Skin: Skin color, texture, turgor normal. No rashes or lesions Lymph nodes: Cervical, supraclavicular, and axillary nodes normal. Neurologic: Grossly normal Incision/Wound: recent Rt PCNL site with dry dressing in place. No large ecchymoses / hematoma.   Disposition:      Medication List    STOP taking these medications       oxyCODONE-acetaminophen 5-325 MG per tablet  Commonly known as:  PERCOCET/ROXICET      TAKE these medications       hydrocortisone 25 MG suppository  Commonly known as:  ANUSOL-HC  Place 1 suppository (25 mg total) rectally at bedtime. For 5 nights then as needed for hemorrhoids     HYDROmorphone 2 MG tablet  Commonly known as:  DILAUDID  Take 1 tablet (2 mg total) by mouth every 4 (four) hours as needed for moderate pain or severe pain. Post-operatively     ibuprofen 200 MG tablet  Commonly known as:  ADVIL,MOTRIN  Take 400 mg by mouth every 6 (six) hours as needed (Pain).     senna-docusate 8.6-50 MG per tablet  Commonly known as:  Senokot-S  Take 1 tablet by mouth 2 (two) times daily. While taking pain meds to prevent constipation     sulfamethoxazole-trimethoprim 800-160 MG per tablet  Commonly known as:  BACTRIM DS  Take 1 tablet by mouth 2 (two) times daily. X 3 days. Begin day prior to next Urology appointment     tetrahydrozoline-zinc 0.05-0.25 % ophthalmic solution  Commonly known as:  VISINE-AC  Place 2 drops into both eyes as needed.  Follow-up Information   Follow up with Sebastian AcheMANNY, Adalyna Godbee, MD On 09/24/2013. (at 9:15 AM for MD visit and office stent removal)    Specialty:  Urology   Contact information:   [address] [place] [address] [phone]       Signed: Sebastian AcheMANNY, Lorenzo Arscott 09/10/2013,  7:46 AM

## 2013-09-10 NOTE — Discharge Instructions (Signed)
1 - You may have urinary urgency (bladder spasms) and bloody urine on / off with stent in place. This is normal. ° °2 - Call MD or go to ER for fever >102, severe pain / nausea / vomiting not relieved by medications, or acute change in medical status ° °

## 2013-09-10 NOTE — Op Note (Signed)
[name], [name]                ACCOUNT NO.:  [id number]  MEDICAL RECORD NO.:  [id number]  LOCATION:  1404                         FACILITY:  [facility]  PHYSICIAN:  Sebastian Ache, MD     DATE OF BIRTH:  [birth year]  DATE OF PROCEDURE: 09/09/2013  DATE OF DISCHARGE:                              OPERATIVE REPORT   DIAGNOSIS:  Residual right renal stone, left renal calcification.  PROCEDURE: 1. Cystoscopy, bilateral retrograde pyelogram interpretation. 2. Insertion of bilateral ureteral stents, 6 x 24, no tether. 3. Bilateral diagnostic ureteroscopy. 4. Right second-stage percutaneous nephrostolithotomy stone less than     2 cm.  SPECIMEN:  Right residual renal stone for compositional analysis.  FINDINGS: 1. No evidence of intraluminal left-sided stones likely calcification     on x-ray therefore parenchymal. 2. Residual right renal pelvis stone, total volume approximately 1.5     cm2. 3. No ureteral stones on the right or left. 4. Unremarkable bilateral retrograde pyelograms.  No excessive     extravasation of contrast on the right side following second-stage     percutaneous nephrostolithotomy.  DRAINS:  Foley catheter straight drain.  INDICATION:  [name] [name] is a very pleasant 48 year old gentleman with history of recurrent nephrolithiasis.  He was found on workup of discomfort and hematuria to have partial right staghorn kidney stone as well as a left renal lower pole calcification.  Options were discussed for management.  He wished to proceed with bilateral stage percutaneous ureteroscopic approach.  Informed consent was obtained and placed in medical record.  He underwent his first stage procedure on September 07, 2013, presents for second-stage procedure today focusing on his right residual stone, and his left-sided stones.  PROCEDURE IN DETAIL:  The patient being [name] [name] verified. Procedure being right second-stage percutaneous nephrostolithotomy, left ureteroscopic  stone manipulation was confirmed.  Procedure was carried out.  Time-out was performed.  Intravenous antibiotics administered. General endotracheal anesthesia was introduced.  The patient was placed into a modified low lithotomy position employing a bump under his right flank elevating his percutaneous site above this, placing his right leg higher than his left bringing the right arm across the left, padding with pillows and taping in place.  This allowed access to his percutaneous site as well as his penis for simultaneous antegrade and retrograde approach.  Sterile field was created by prepping and draping the patient's penis, perineum, proximal thighs using iodine x3, and his right flank was prepped using chlorhexidine gluconate.  Prepping the nephrostomy tube and KMP catheter in place, percutaneous drape was applied to right flank.  Cysto drapes applied to the genitalia. Attention was directed to the left side.  Cystoscopy was performed using a 22-French rigid cystoscope with 12-degree offset lens.  Inspection of the anterior and posterior urethra unremarkable.  Inspection of urinary bladder revealed a distal end of right nephroureteral stent in place, distal end of left ureteral stent in place.  The left distal aspect was grasped proximal at the urethral meatus.  A 0.038 Glidewire was advanced to the upper pole, and exchanged for a 6-French end-hole catheter and left retrograde pyelogram was obtained.  Left retrograde pyelogram demonstrated a single left ureter, single  system left kidney.  No filling defects or narrowing noted.  A 0.038 Glidewire was once again advanced to the level of the lower pole, set aside as a safety wire.  Next, semi-rigid ureteroscopy was performed in the entire length of left ureter using a 4.5 semi-rigid ureteroscope alongside a separate Sensor working wire.  An 8-French feeding tube in urinary bladder for pressure release.  No mucosal abnormalities  or calcifications were seen in the entire length of left ureter.  Next, the semi-rigid ureteroscope was exchanged for a 12/14 38-cm ureteral access sheath was placed under continuous fluoroscopic vision to the level of proximal ureter.  Next, flexible digital ureteroscopy was performed using 6-French digital ureteroscope.  Systematic inspection was performed of each calyx x2.  There were no intraluminal calcifications seen whatsoever.  Corroboration with CT scan was performed and I saw that the lower pole calcification in question was likely parenchymal, this having performed a sufficient left diagnostic ureteroscopy, and as such, the sheath was used.  It was felt that replacement of the stent was performed on the left.  The sheath was removed under continuous ureteroscopic vision.  No mucosal abnormalities were found.  Finally, a new 6 x 24 Contour-type stent was placed with remaining safety wire using cystoscopic and fluoroscopic guidance.  Good proximal and distal curl were noted.  Attention was then directed at the right side. Initial right antegrade nephrostogram was performed.  Right antegrade nephrostogram revealed excellent placement of nephrostomy tube in lower pole calyx with contrast freely flowing down the right ureter with no extravasation.  The nephrostomy tube was removed.  Foley catheter was placed per urethra to straight drain. Next, flexible nephroscopy was performed using a 16-French flexible cystoscope.  Systematic inspection was performed of each calyx x2. There were several small residual stone fragments less than 3-4 mm in the lower pole.  These were grasped with a ZeroTip basket and brought out.  In the area of the renal pelvis, there was a dominant larger calcification approximately 1.5 cm in diameter, that rigid technique did not allow to address last stage.  Holmium laser energy was used to fragment the stone into 2 pieces using settings of 0.5 joules and  10 hertz.  These pieces were then grasped with a ZeroTip basket and brought out in their entirety, set aside for compositional analysis.  Due to angulation entering from the lower pole, the area of the UPJ and ureter could not easily be accessed in antegrade fashion.  As such, to clear the ureter and UPJ, retrograde technique was employed.  The KMP safety catheter was exchanged for a ZIPwire acting as nephroureteral safety wire.  Foley catheter was removed.  Semi-rigid ureteroscopy was performed in the entire length of the right ureter alongside a separate Sensor working wire.  No mucosal abnormalities were encountered.  To better evaluate the UPJ, the semi-rigid ureteroscope was exchanged for a 12/14 ureteral access sheath at the level of proximal ureter.  Flexible digital ureteroscopy was performed using a 6-French digital ureteroscope.  Inspection of the UPJ revealed that is completely intact. There were no residual stone fragments.  The kidney was once again inspected using ureteroscopic technique and no perforation, residual fragments were seen.  The sheath was removed under continuous ureteroscopic vision.  Finally, a new 6 x 24 Contour-type stent was placed over the nephroureteral safety wire using antegrade approach. Good proximal and distal deployment noted.  Distal curl in urinary bladder and proximal curl in renal pelvis.  The percutaneous tract  was sealed with 5 mL SurgiFlo, closed the level of skin using interrupted Vicryl followed by dry dressing.  Foley catheter was replaced to straight drain and procedure was then terminated.  The patient tolerated the procedure well.  There were no immediate periprocedural complications.  The patient was taken to postanesthesia care unit in stable condition.         ______________________________ Sebastian Ache, MD    TM/MEDQ  D:  09/09/2013  T:  09/10/2013  Job:  [id number]

## 2014-12-18 ENCOUNTER — Other Ambulatory Visit (HOSPITAL_COMMUNITY)
Admission: AD | Admit: 2014-12-18 | Discharge: 2014-12-18 | Disposition: A | Discharge status: Home / Self Care | Payer: PRIVATE HEALTH INSURANCE | Source: Ambulatory Visit | Attending: Emergency Medicine | Admitting: Emergency Medicine

## 2014-12-18 ENCOUNTER — Ambulatory Visit (HOSPITAL_COMMUNITY)
Admission: EM | Admit: 2014-12-18 | Discharge: 2014-12-18 | Discharge status: Absent without leave | Payer: PRIVATE HEALTH INSURANCE | Attending: Emergency Medicine | Admitting: Emergency Medicine

## 2014-12-18 DIAGNOSIS — Z029 Encounter for administrative examinations, unspecified: Secondary | ICD-10-CM | POA: Insufficient documentation

## 2014-12-18 LAB — CBC
HCT: 49.1 % (ref 39.0–52.0)
Hemoglobin: 17.5 g/dL — ABNORMAL HIGH (ref 13.0–17.0)
MCH: 35 pg — ABNORMAL HIGH (ref 26.0–34.0)
MCHC: 35.6 g/dL (ref 30.0–36.0)
MCV: 98.2 fL (ref 78.0–100.0)
Platelets: 205 10*3/uL (ref 150–400)
RBC: 5 MIL/uL (ref 4.22–5.81)
RDW: 11.8 % (ref 11.5–15.5)
WBC: 9 10*3/uL (ref 4.0–10.5)

## 2014-12-18 LAB — SEDIMENTATION RATE: Sed Rate: 11 mm/hr (ref 0–16)

## 2014-12-19 LAB — RPR: RPR Ser Ql: NONREACTIVE

## 2014-12-20 LAB — ANA W/REFLEX: Anti Nuclear Antibody(ANA): NEGATIVE

## 2014-12-20 LAB — ROCKY MTN SPOTTED FVR ABS PNL(IGG+IGM)
RMSF IgG: NEGATIVE
RMSF IgM: 0.21 index (ref 0.00–0.89)

## 2015-03-06 IMAGING — RF DG C-ARM 1-60 MIN-NO REPORT
1 series · 12 of 12 positions shown · non-contrast
Comparison: none

CLINICAL DATA: Kidney stone.

EXAM:
INTRAOPERATIVE RIGHT PERCUTANEOUS NEPHROLITHOTOMY
FLUOROSCOPY TIME:  12 min and 19 seconds.

[Series 1: run · 12 of 12 slices shown]
[im 1/12]
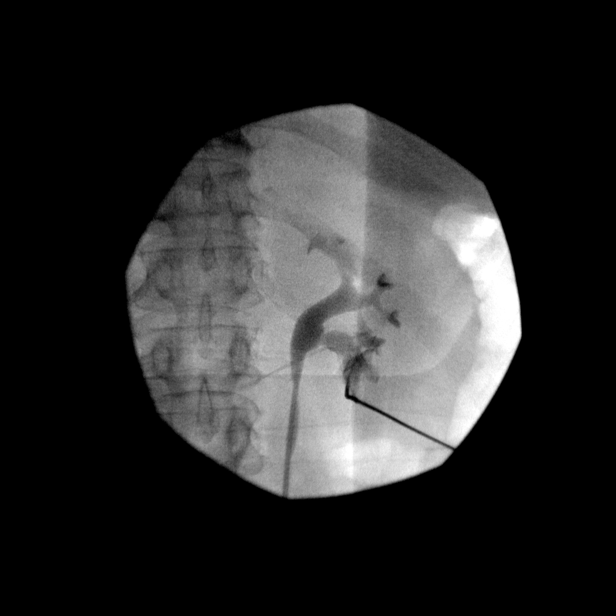
[im 2/12]
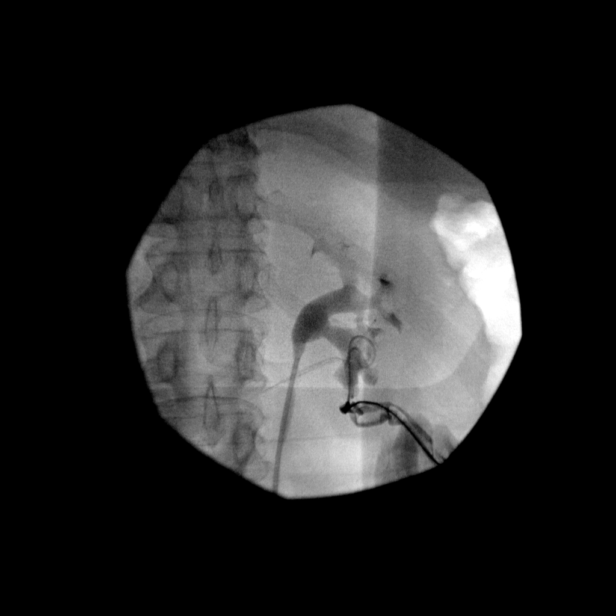
[im 3/12]
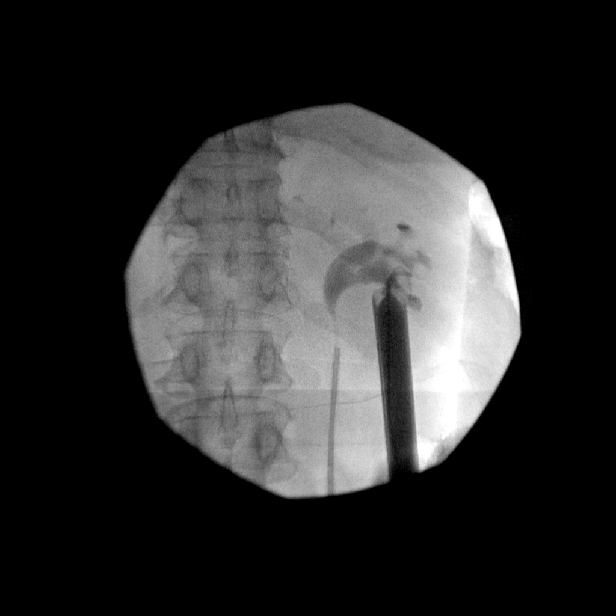
[im 4/12]
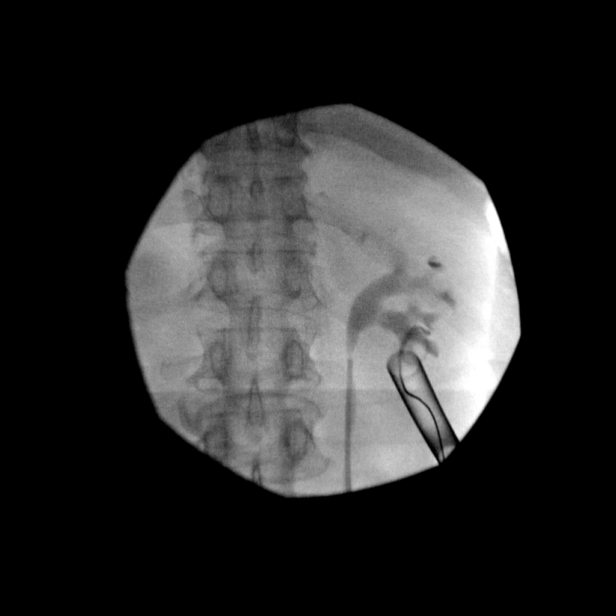
[im 5/12]
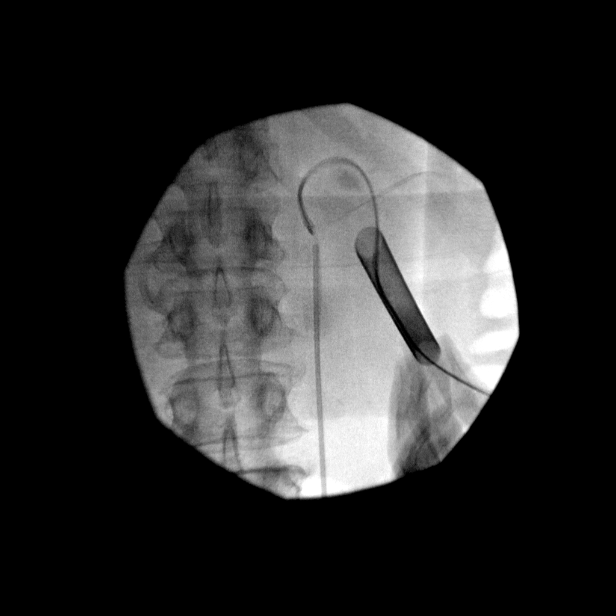
[im 6/12]
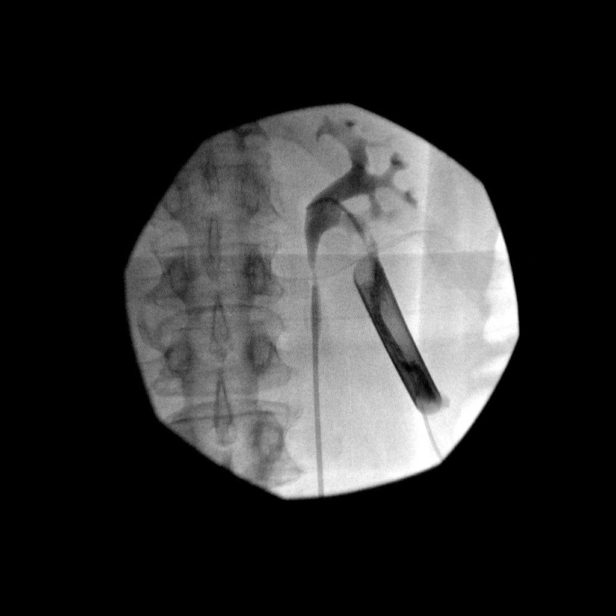
[im 7/12]
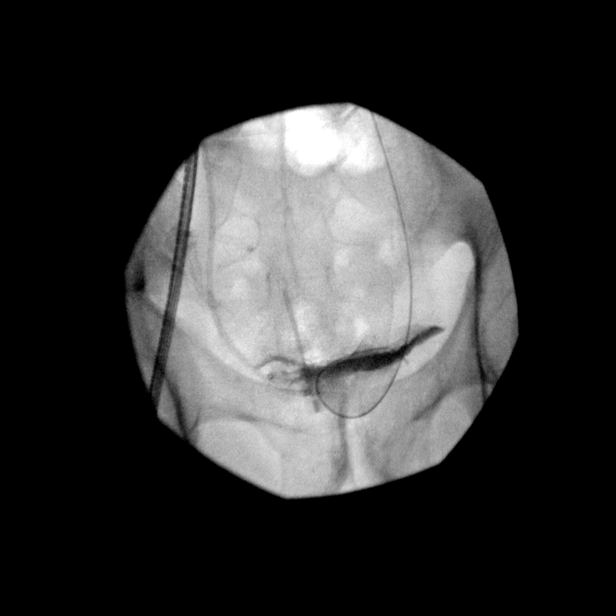
[im 8/12]
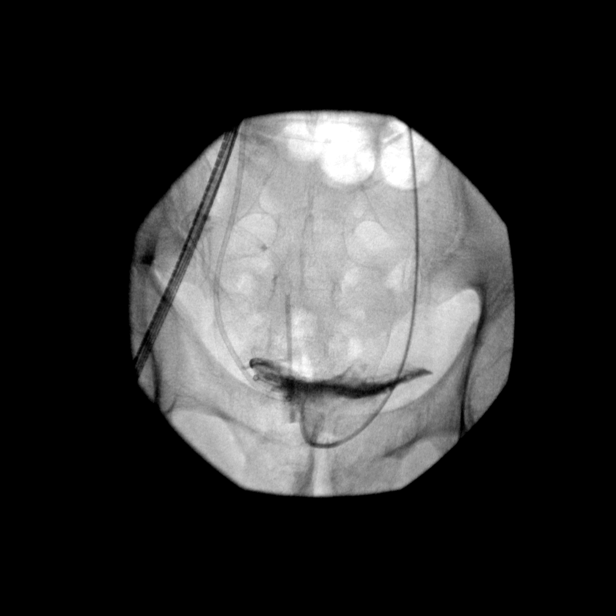
[im 9/12]
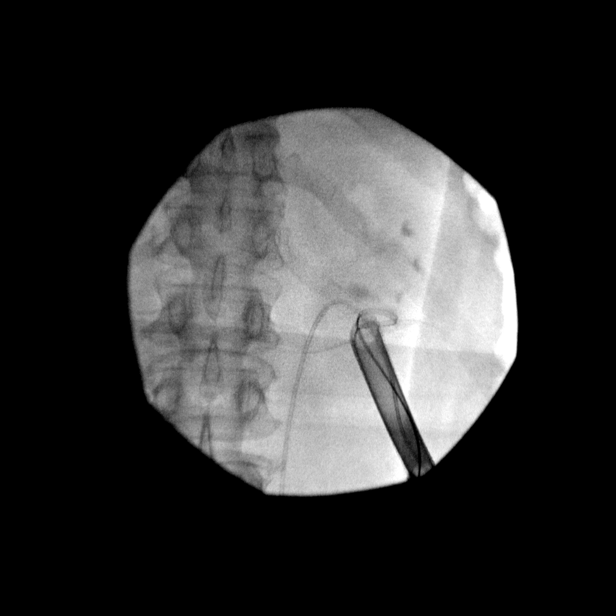
[im 10/12]
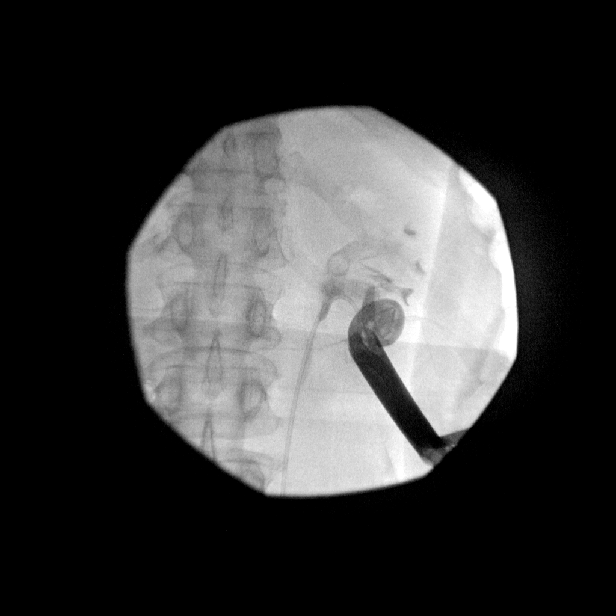
[im 11/12]
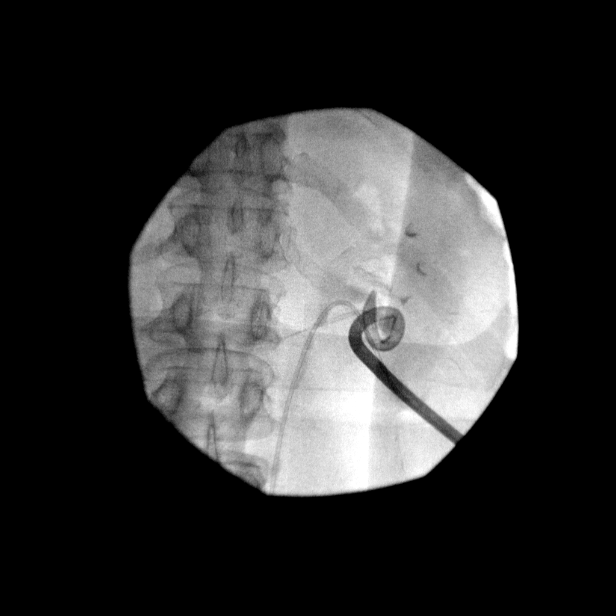
[im 12/12]
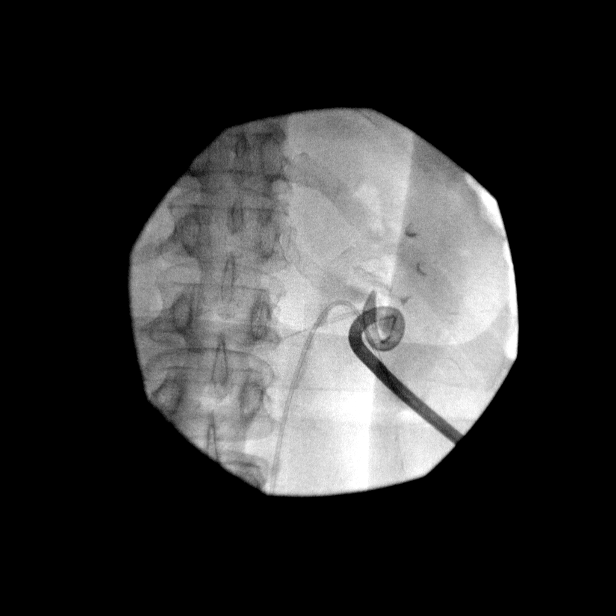

[12 of 12 positions shown; findings below may reference images not displayed]

FINDINGS: C-ARM FILMS DOCUMENT PLACEMENT OF A PIGTAIL CATHETER IN A LOWER POLE
COLLECTING SYSTEM ON THE RIGHT. A DOUBLE-J STENT WAS PLACED FROM THE
COLLECTING SYSTEM INTO THE BLADDER.
IMPRESSION: Right percutaneous nephrolithotomy with ureteral stent placement.

## 2015-03-06 IMAGING — RF DG RETROGRADE PYELOGRAM
1 series · 7 of 7 positions shown · non-contrast
Comparison: None.

CLINICAL DATA: Kidney stone

EXAM:
INTRAOPERATIVE BILATERAL RETROGRADE UROGRAPHY
TECHNIQUE: Images were obtained with the C-arm fluoroscopic device
intraoperatively and submitted for interpretation post-operatively.
Please see the procedural report for the amount of contrast and the
fluoroscopy time utilized.

[Series 1: run · 7 of 7 slices shown]
[im 1/7]
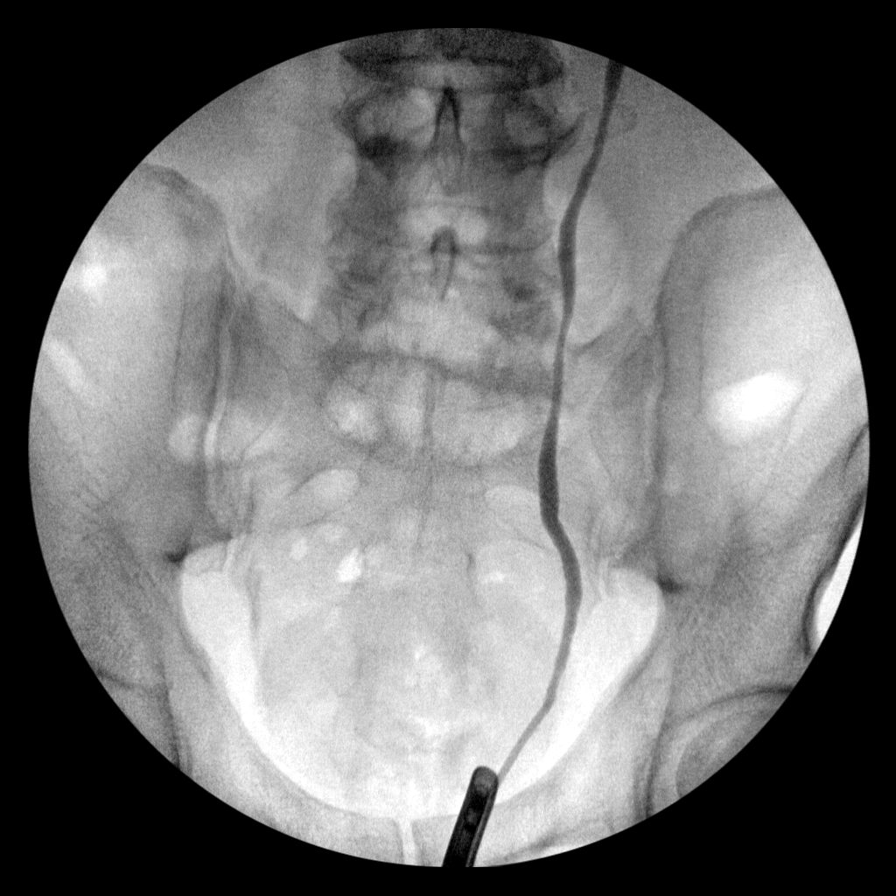
[im 2/7]
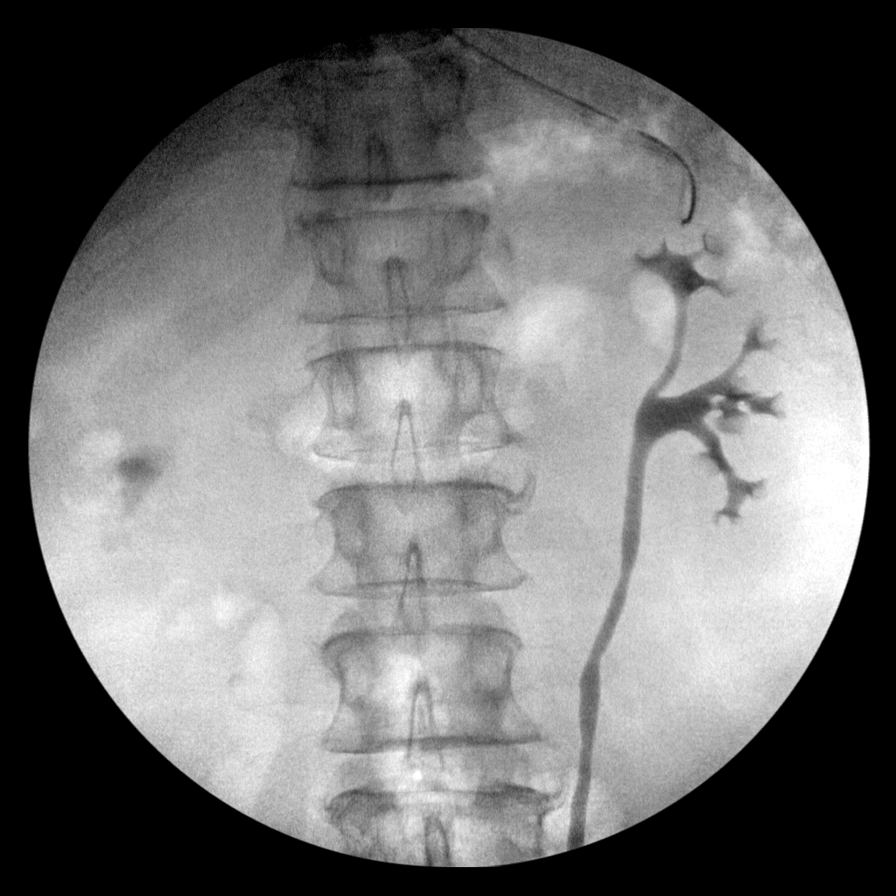
[im 3/7]
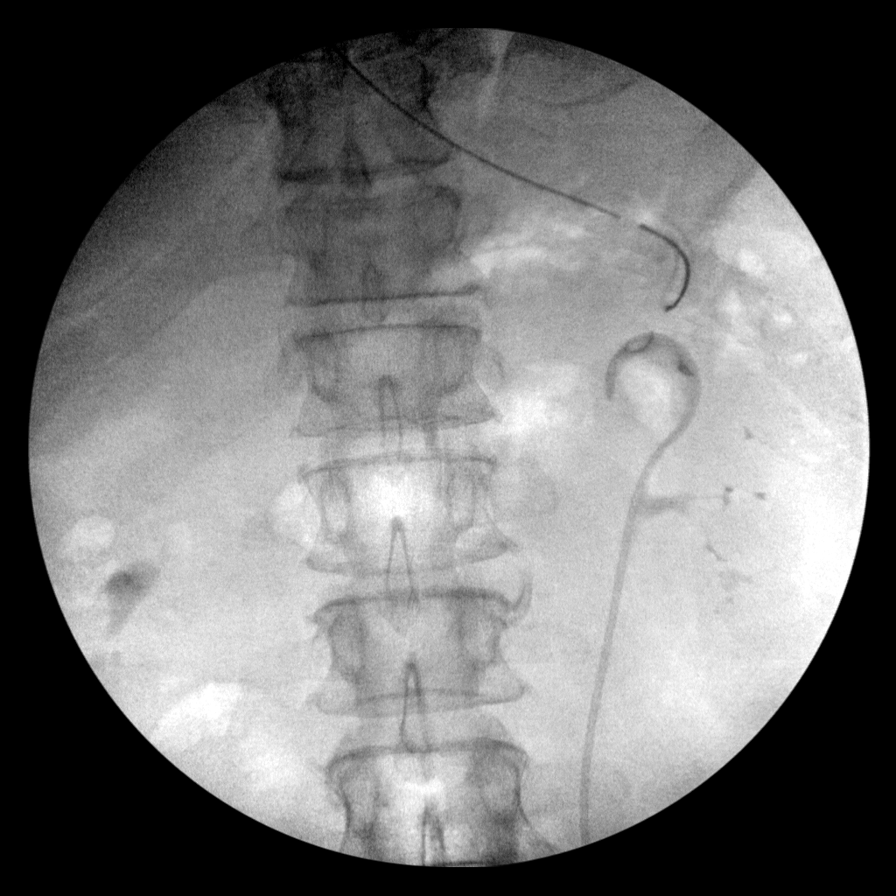
[im 4/7]
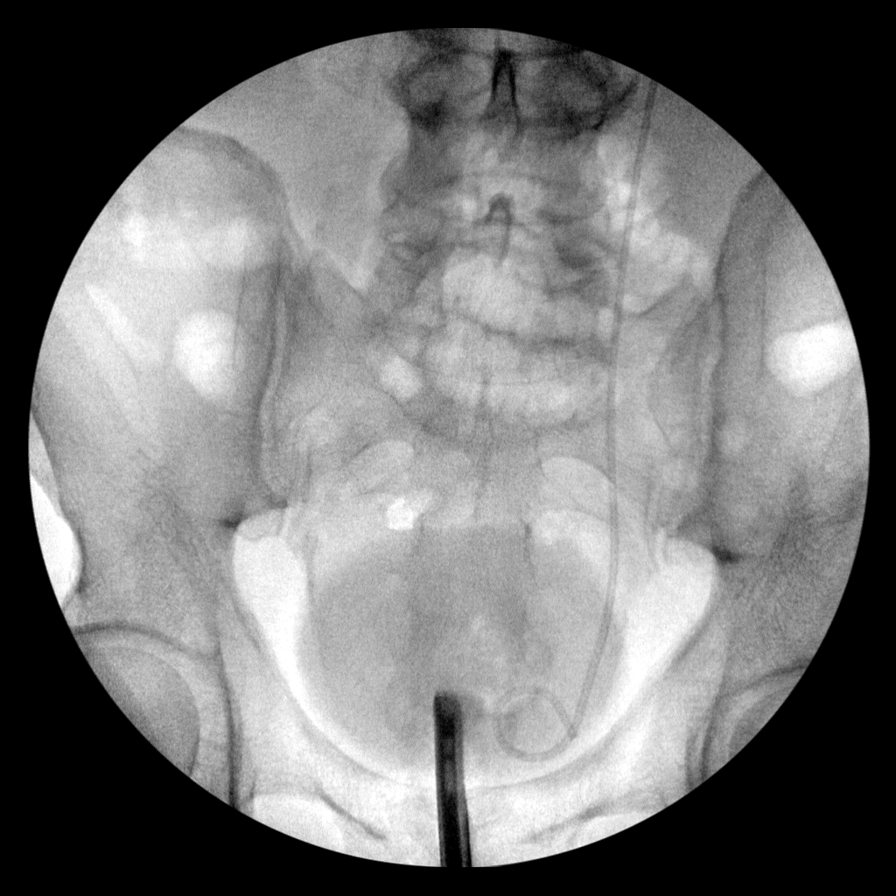
[im 5/7]
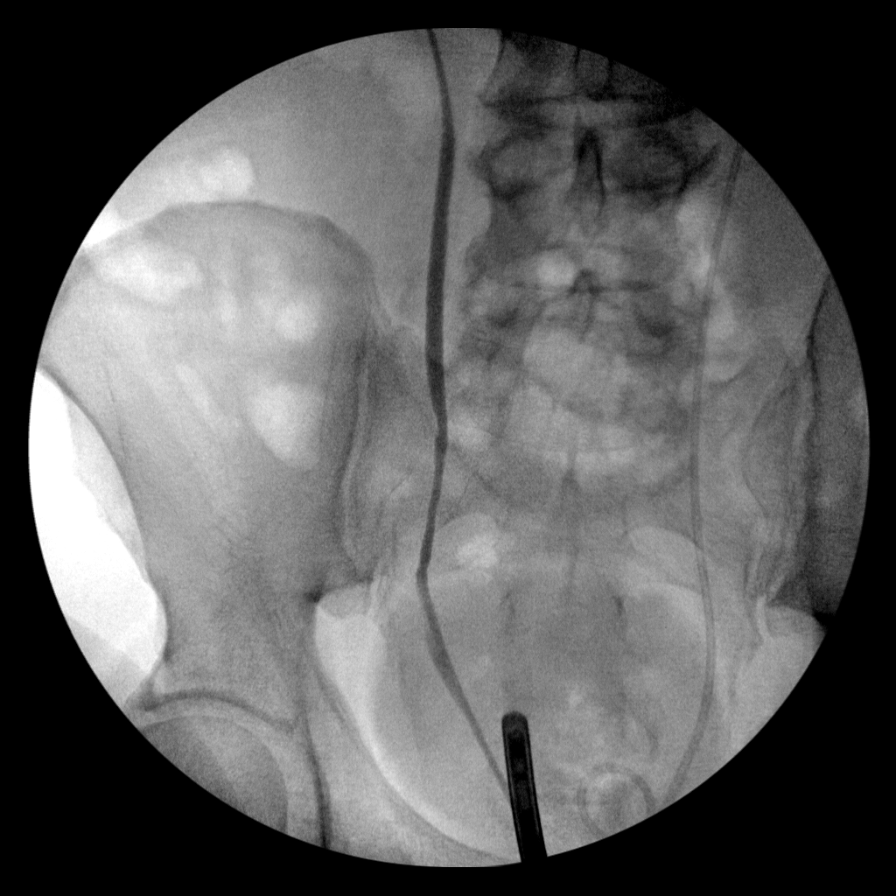
[im 6/7]
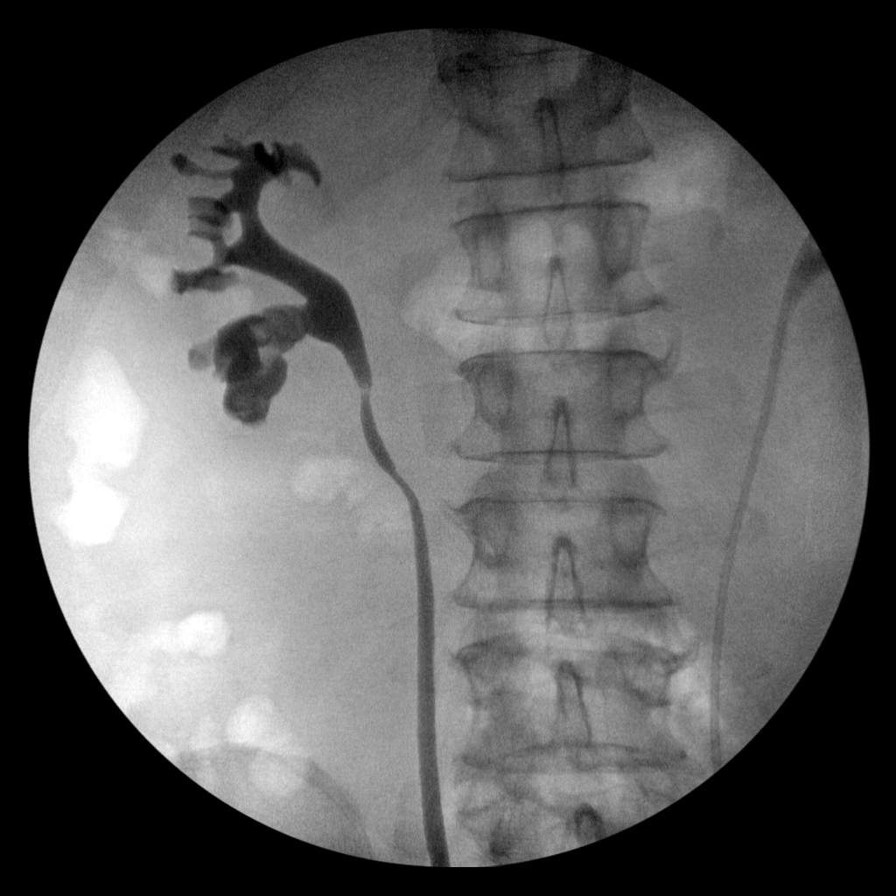
[im 7/7]
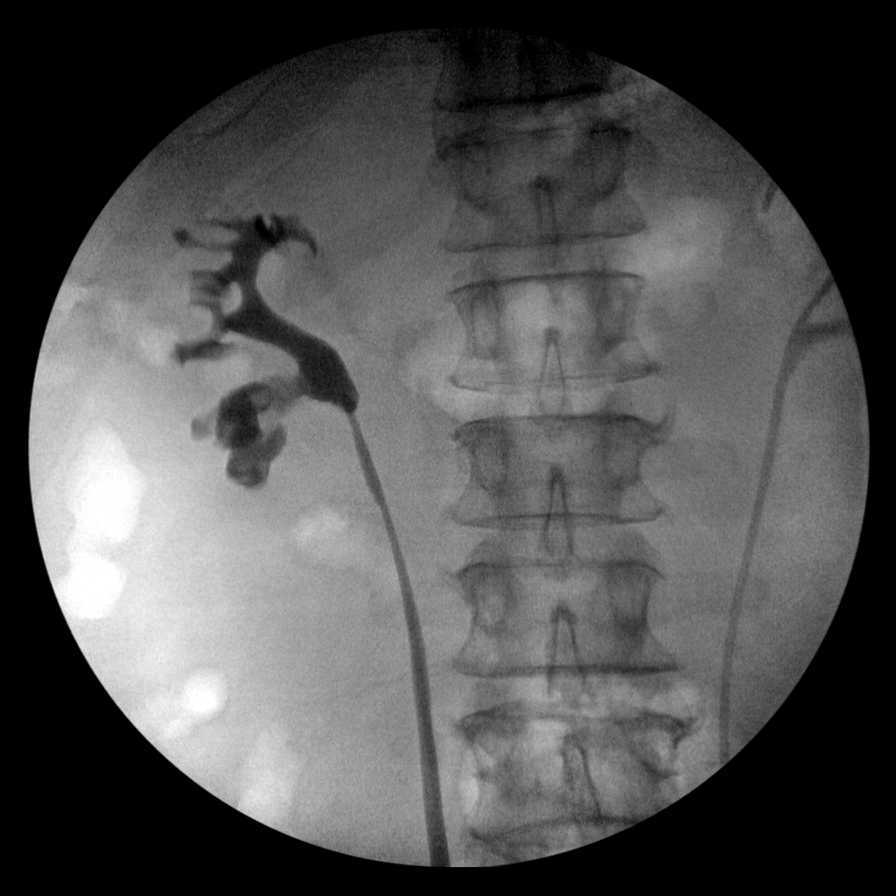

[7 of 7 positions shown; findings below may reference images not displayed]

FINDINGS: Images demonstrate cannulation of the ureteral orifices and contrast
for pyelography bilaterally. A left double-J ureteral stent has been
placed. There is narrowing at the right ureteropelvic junction.
IMPRESSION: See above.

## 2016-11-26 ENCOUNTER — Other Ambulatory Visit: Payer: Self-pay | Admitting: Internal Medicine

## 2016-11-26 ENCOUNTER — Ambulatory Visit
Admission: RE | Admit: 2016-11-26 | Discharge: 2016-11-26 | Disposition: A | Discharge status: Home / Self Care | Payer: Managed Care, Other (non HMO) | Source: Ambulatory Visit | Attending: Internal Medicine | Admitting: Internal Medicine

## 2016-11-26 DIAGNOSIS — R945 Abnormal results of liver function studies: Secondary | ICD-10-CM

## 2016-11-26 DIAGNOSIS — R7989 Other specified abnormal findings of blood chemistry: Secondary | ICD-10-CM

## 2016-11-26 DIAGNOSIS — R1011 Right upper quadrant pain: Secondary | ICD-10-CM

## 2016-11-26 DIAGNOSIS — R188 Other ascites: Secondary | ICD-10-CM

## 2016-11-26 DIAGNOSIS — I10 Essential (primary) hypertension: Secondary | ICD-10-CM

## 2016-11-26 MED ORDER — IOPAMIDOL (ISOVUE-300) INJECTION 61%
125.0000 mL | Freq: Once | INTRAVENOUS | Status: AC | PRN
  Administered 2016-11-26: 125 mL via INTRAVENOUS

## 2017-01-21 ENCOUNTER — Encounter: Payer: PRIVATE HEALTH INSURANCE | Admitting: Internal Medicine

## 2017-03-12 ENCOUNTER — Encounter: Payer: Self-pay | Admitting: Internal Medicine

## 2017-03-25 ENCOUNTER — Encounter: Payer: Self-pay | Admitting: Internal Medicine

## 2017-07-27 ENCOUNTER — Other Ambulatory Visit: Payer: Self-pay

## 2017-07-27 ENCOUNTER — Emergency Department (HOSPITAL_COMMUNITY): Payer: Managed Care, Other (non HMO)

## 2017-07-27 ENCOUNTER — Inpatient Hospital Stay (HOSPITAL_COMMUNITY)
Admission: EM | Admit: 2017-07-27 | Discharge: 2017-08-05 | DRG: 064 | Disposition: A | Discharge status: Rehabilitation Facility | Payer: Managed Care, Other (non HMO) | Attending: Neurology | Admitting: Neurology

## 2017-07-27 ENCOUNTER — Encounter (HOSPITAL_COMMUNITY): Payer: Self-pay | Admitting: Emergency Medicine

## 2017-07-27 DIAGNOSIS — R0989 Other specified symptoms and signs involving the circulatory and respiratory systems: Secondary | ICD-10-CM | POA: Diagnosis not present

## 2017-07-27 DIAGNOSIS — H02402 Unspecified ptosis of left eyelid: Secondary | ICD-10-CM | POA: Diagnosis present

## 2017-07-27 DIAGNOSIS — E663 Overweight: Secondary | ICD-10-CM | POA: Diagnosis present

## 2017-07-27 DIAGNOSIS — K5901 Slow transit constipation: Secondary | ICD-10-CM

## 2017-07-27 DIAGNOSIS — R29713 NIHSS score 13: Secondary | ICD-10-CM | POA: Diagnosis present

## 2017-07-27 DIAGNOSIS — G479 Sleep disorder, unspecified: Secondary | ICD-10-CM | POA: Diagnosis not present

## 2017-07-27 DIAGNOSIS — R402252 Coma scale, best verbal response, oriented, at arrival to emergency department: Secondary | ICD-10-CM | POA: Diagnosis present

## 2017-07-27 DIAGNOSIS — K219 Gastro-esophageal reflux disease without esophagitis: Secondary | ICD-10-CM | POA: Diagnosis not present

## 2017-07-27 DIAGNOSIS — R531 Weakness: Secondary | ICD-10-CM | POA: Diagnosis present

## 2017-07-27 DIAGNOSIS — Z823 Family history of stroke: Secondary | ICD-10-CM

## 2017-07-27 DIAGNOSIS — D72829 Elevated white blood cell count, unspecified: Secondary | ICD-10-CM | POA: Diagnosis present

## 2017-07-27 DIAGNOSIS — Z7289 Other problems related to lifestyle: Secondary | ICD-10-CM

## 2017-07-27 DIAGNOSIS — I63411 Cerebral infarction due to embolism of right middle cerebral artery: Secondary | ICD-10-CM | POA: Diagnosis not present

## 2017-07-27 DIAGNOSIS — I6529 Occlusion and stenosis of unspecified carotid artery: Secondary | ICD-10-CM | POA: Diagnosis present

## 2017-07-27 DIAGNOSIS — Z6827 Body mass index (BMI) 27.0-27.9, adult: Secondary | ICD-10-CM | POA: Diagnosis not present

## 2017-07-27 DIAGNOSIS — R29810 Facial weakness: Secondary | ICD-10-CM | POA: Diagnosis present

## 2017-07-27 DIAGNOSIS — G936 Cerebral edema: Secondary | ICD-10-CM | POA: Diagnosis present

## 2017-07-27 DIAGNOSIS — R402142 Coma scale, eyes open, spontaneous, at arrival to emergency department: Secondary | ICD-10-CM | POA: Diagnosis present

## 2017-07-27 DIAGNOSIS — G9340 Encephalopathy, unspecified: Secondary | ICD-10-CM | POA: Diagnosis not present

## 2017-07-27 DIAGNOSIS — I10 Essential (primary) hypertension: Secondary | ICD-10-CM | POA: Diagnosis present

## 2017-07-27 DIAGNOSIS — G459 Transient cerebral ischemic attack, unspecified: Secondary | ICD-10-CM

## 2017-07-27 DIAGNOSIS — F17210 Nicotine dependence, cigarettes, uncomplicated: Secondary | ICD-10-CM | POA: Diagnosis present

## 2017-07-27 DIAGNOSIS — R402362 Coma scale, best motor response, obeys commands, at arrival to emergency department: Secondary | ICD-10-CM | POA: Diagnosis present

## 2017-07-27 DIAGNOSIS — M79672 Pain in left foot: Secondary | ICD-10-CM | POA: Diagnosis present

## 2017-07-27 DIAGNOSIS — F1410 Cocaine abuse, uncomplicated: Secondary | ICD-10-CM | POA: Diagnosis present

## 2017-07-27 DIAGNOSIS — M25512 Pain in left shoulder: Secondary | ICD-10-CM | POA: Diagnosis not present

## 2017-07-27 DIAGNOSIS — I6521 Occlusion and stenosis of right carotid artery: Secondary | ICD-10-CM | POA: Diagnosis not present

## 2017-07-27 DIAGNOSIS — G8194 Hemiplegia, unspecified affecting left nondominant side: Secondary | ICD-10-CM | POA: Diagnosis present

## 2017-07-27 DIAGNOSIS — I63131 Cerebral infarction due to embolism of right carotid artery: Secondary | ICD-10-CM | POA: Diagnosis present

## 2017-07-27 DIAGNOSIS — E785 Hyperlipidemia, unspecified: Secondary | ICD-10-CM | POA: Diagnosis present

## 2017-07-27 DIAGNOSIS — D72823 Leukemoid reaction: Secondary | ICD-10-CM | POA: Diagnosis not present

## 2017-07-27 DIAGNOSIS — H5000 Unspecified esotropia: Secondary | ICD-10-CM | POA: Diagnosis present

## 2017-07-27 DIAGNOSIS — I5030 Unspecified diastolic (congestive) heart failure: Secondary | ICD-10-CM | POA: Diagnosis not present

## 2017-07-27 DIAGNOSIS — R471 Dysarthria and anarthria: Secondary | ICD-10-CM | POA: Diagnosis present

## 2017-07-27 DIAGNOSIS — F17200 Nicotine dependence, unspecified, uncomplicated: Secondary | ICD-10-CM

## 2017-07-27 DIAGNOSIS — I69354 Hemiplegia and hemiparesis following cerebral infarction affecting left non-dominant side: Secondary | ICD-10-CM | POA: Diagnosis not present

## 2017-07-27 DIAGNOSIS — G9349 Other encephalopathy: Secondary | ICD-10-CM | POA: Diagnosis present

## 2017-07-27 DIAGNOSIS — H04123 Dry eye syndrome of bilateral lacrimal glands: Secondary | ICD-10-CM | POA: Diagnosis not present

## 2017-07-27 DIAGNOSIS — G8114 Spastic hemiplegia affecting left nondominant side: Secondary | ICD-10-CM | POA: Diagnosis not present

## 2017-07-27 DIAGNOSIS — I639 Cerebral infarction, unspecified: Secondary | ICD-10-CM | POA: Diagnosis not present

## 2017-07-27 DIAGNOSIS — I63511 Cerebral infarction due to unspecified occlusion or stenosis of right middle cerebral artery: Secondary | ICD-10-CM | POA: Diagnosis not present

## 2017-07-27 HISTORY — DX: Essential (primary) hypertension: I10

## 2017-07-27 LAB — I-STAT CHEM 8, ED
BUN: 12 mg/dL (ref 6–20)
CHLORIDE: 103 mmol/L (ref 101–111)
Calcium, Ion: 1.12 mmol/L — ABNORMAL LOW (ref 1.15–1.40)
Creatinine, Ser: 1 mg/dL (ref 0.61–1.24)
Glucose, Bld: 118 mg/dL — ABNORMAL HIGH (ref 65–99)
HCT: 53 % — ABNORMAL HIGH (ref 39.0–52.0)
Hemoglobin: 18 g/dL — ABNORMAL HIGH (ref 13.0–17.0)
Potassium: 3.7 mmol/L (ref 3.5–5.1)
SODIUM: 141 mmol/L (ref 135–145)
TCO2: 25 mmol/L (ref 22–32)

## 2017-07-27 LAB — DIFFERENTIAL
Abs Immature Granulocytes: 0.1 10*3/uL (ref 0.0–0.1)
BASOS ABS: 0.1 10*3/uL (ref 0.0–0.1)
BASOS PCT: 0 %
EOS ABS: 0 10*3/uL (ref 0.0–0.7)
Eosinophils Relative: 0 %
IMMATURE GRANULOCYTES: 0 %
Lymphocytes Relative: 16 %
Lymphs Abs: 2.5 10*3/uL (ref 0.7–4.0)
MONOS PCT: 9 %
Monocytes Absolute: 1.5 10*3/uL — ABNORMAL HIGH (ref 0.1–1.0)
NEUTROS PCT: 75 %
Neutro Abs: 11.9 10*3/uL — ABNORMAL HIGH (ref 1.7–7.7)

## 2017-07-27 LAB — CBG MONITORING, ED: Glucose-Capillary: 94 mg/dL (ref 65–99)

## 2017-07-27 LAB — CBC
HCT: 52.2 % — ABNORMAL HIGH (ref 39.0–52.0)
Hemoglobin: 18 g/dL — ABNORMAL HIGH (ref 13.0–17.0)
MCH: 33.8 pg (ref 26.0–34.0)
MCHC: 34.5 g/dL (ref 30.0–36.0)
MCV: 97.9 fL (ref 78.0–100.0)
PLATELETS: 254 10*3/uL (ref 150–400)
RBC: 5.33 MIL/uL (ref 4.22–5.81)
RDW: 12.2 % (ref 11.5–15.5)
WBC: 16.1 10*3/uL — AB (ref 4.0–10.5)

## 2017-07-27 LAB — COMPREHENSIVE METABOLIC PANEL
ALT: 37 U/L (ref 17–63)
AST: 51 U/L — ABNORMAL HIGH (ref 15–41)
Albumin: 4.1 g/dL (ref 3.5–5.0)
Alkaline Phosphatase: 71 U/L (ref 38–126)
Anion gap: 10 (ref 5–15)
BUN: 11 mg/dL (ref 6–20)
CHLORIDE: 102 mmol/L (ref 101–111)
CO2: 25 mmol/L (ref 22–32)
Calcium: 9.6 mg/dL (ref 8.9–10.3)
Creatinine, Ser: 1.22 mg/dL (ref 0.61–1.24)
GFR calc Af Amer: >60 mL/min (ref >60)
Glucose, Bld: 120 mg/dL — ABNORMAL HIGH (ref 65–99)
POTASSIUM: 3.7 mmol/L (ref 3.5–5.1)
SODIUM: 137 mmol/L (ref 135–145)
Total Bilirubin: 1.1 mg/dL (ref 0.3–1.2)
Total Protein: 7.5 g/dL (ref 6.5–8.1)

## 2017-07-27 LAB — PROTIME-INR
INR: 1.05
Prothrombin Time: 13.6 seconds (ref 11.4–15.2)

## 2017-07-27 LAB — APTT: APTT: 26 s (ref 24–36)

## 2017-07-27 LAB — I-STAT TROPONIN, ED: TROPONIN I, POC: 0.02 ng/mL (ref 0.00–0.08)

## 2017-07-27 MED ORDER — IOPAMIDOL (ISOVUE-370) INJECTION 76%
100.0000 mL | Freq: Once | INTRAVENOUS | Status: AC | PRN
  Administered 2017-07-27: 100 mL via INTRAVENOUS

## 2017-07-27 NOTE — Consult Note (Signed)
Reason for Consult: Code Stroke Referring Physician: ER  [name] [name] is an 52 y.o. male.  HPI: Woke up and noted left sided hemiplegia acutely at 7 am.  Not found until 8:30 am.  Refused to come to the ER until 8:30 pm.  CT Brain no blood, but hypodensity right MCA with ASPECTS 3-4.  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  RAPID shows core infarct 69 ml and penumbra 133 ml with ratio 1.9.  NIHSS 13.  He has a history of hypertension but not fully compliant.  No antiplatelet therapy at home.  Past Medical History:  Diagnosis Date  . GERD (gastroesophageal reflux disease)   . Hypertension   . Nephrolithiasis   . Tobacco abuse     Past Surgical History:  Procedure Laterality Date  . CYSTOSCOPY W/ URETERAL STENT PLACEMENT Bilateral 09/07/2013   Procedure: CYSTOSCOPY WITH RETROGRADE PYELOGRAM/URETERAL STENT PLACEMENT;  Surgeon: Alexis Frock, MD;  Location: [facility];  Service: Urology;  Laterality: Bilateral;  . CYSTOSCOPY WITH RETROGRADE PYELOGRAM, URETEROSCOPY AND STENT PLACEMENT Bilateral 09/09/2013   Procedure: CYSTOSCOPY WITH RETROGRADE PYELOGRAM, DIAGNOSTIC URETEROSCOPY AND STENT CHANGE;  Surgeon: Alexis Frock, MD;  Location: [facility];  Service: Urology;  Laterality: Bilateral;  . HOLMIUM LASER APPLICATION Right 11/11/2639   Procedure: HOLMIUM LASER APPLICATION;  Surgeon: Alexis Frock, MD;  Location: [facility];  Service: Urology;  Laterality: Right;  . HOLMIUM LASER APPLICATION Right 07/10/3092   Procedure: HOLMIUM LASER APPLICATION;  Surgeon: Alexis Frock, MD;  Location: [facility];  Service: Urology;  Laterality: Right;  . NEPHROLITHOTOMY Right 09/07/2013   Procedure: FIRST STAGE RIGHT PERCUTANEOUS NEPHROLITHOTOMY  WITH ACCESS, ;  Surgeon: Alexis Frock, MD;  Location: [facility];  Service: Urology;  Laterality: Right;  . NEPHROLITHOTOMY Right 09/09/2013   Procedure: RIGHT 2ND STAGE NEPHROLITHOTOMY PERCUTANEOUS;  Surgeon: Alexis Frock, MD;  Location: [facility];  Service: Urology;   Laterality: Right;  . UMBILICAL HERNIA REPAIR      Family History  Problem Relation Age of Onset  . Prostate cancer Father 24  . Lung cancer Father   . Nephrolithiasis Father   . Ovarian cancer Mother   . CVA Paternal Grandmother        in her 89's  . Cancer Paternal Grandmother        eye  . Diabetes Maternal Grandmother   . Alcoholism Maternal Uncle        x 3    Social History:  reports that he has been smoking cigarettes.  He has a 25.00 pack-year smoking history. He has never used smokeless tobacco. He reports that he drinks alcohol. He reports that he has current or past drug history. Drug: Marijuana.  Allergies: No Known Allergies  Prior to Admission medications   Medication Sig Start Date End Date Taking? Authorizing Provider  hydrocortisone (ANUSOL-HC) 25 MG suppository Place 1 suppository (25 mg total) rectally at bedtime. For 5 nights then as needed for hemorrhoids 08/24/13   Gatha Mayer, MD  HYDROmorphone (DILAUDID) 2 MG tablet Take 1 tablet (2 mg total) by mouth every 4 (four) hours as needed for moderate pain or severe pain. Post-operatively 09/10/13   Alexis Frock, MD  ibuprofen (ADVIL,MOTRIN) 200 MG tablet Take 400 mg by mouth every 6 (six) hours as needed (Pain).    [provider]  senna-docusate (SENOKOT-S) 8.6-50 MG per tablet Take 1 tablet by mouth 2 (two) times daily. While taking pain meds to prevent constipation 09/10/13   Alexis Frock, MD  sulfamethoxazole-trimethoprim (BACTRIM DS) 800-160 MG per tablet Take 1 tablet by mouth 2 (two) times daily. X 3 days. Begin day prior to next Urology appointment 09/10/13   Alexis Frock, MD  tetrahydrozoline-zinc (VISINE-AC) 0.05-0.25 % ophthalmic solution Place 2 drops into both eyes as needed.    [provider]    Medications: Prior to Admission:  (Not in a hospital admission)  Results for orders placed or performed during the hospital encounter of 07/27/17 (from the past 48 hour(s))   Protime-INR     Status: None   Collection Time: 07/27/17  8:25 PM  Result Value Ref Range   Prothrombin Time 13.6 11.4 - 15.2 seconds   INR 1.05     Comment: Performed at [facility], [address], [place], [place] [address]  APTT     Status: None   Collection Time: 07/27/17  8:25 PM  Result Value Ref Range   aPTT 26 24 - 36 seconds    Comment: Performed at [facility] [address] [place], [place] [address]  CBC     Status: Abnormal   Collection Time: 07/27/17  8:25 PM  Result Value Ref Range   WBC 16.1 (H) 4.0 - 10.5 K/uL   RBC 5.33 4.22 - 5.81 MIL/uL   Hemoglobin 18.0 (H) 13.0 - 17.0 g/dL   HCT 52.2 (H) 39.0 - 52.0 %   MCV 97.9 78.0 - 100.0 fL   MCH 33.8 26.0 - 34.0 pg   MCHC 34.5 30.0 - 36.0 g/dL   RDW 12.2 11.5 - 15.5 %   Platelets 254 150 - 400 K/uL    Comment: Performed at [facility] [address], [place], [place] [address]  Differential     Status: Abnormal   Collection Time: 07/27/17  8:25 PM  Result Value Ref Range   Neutrophils Relative % 75 %   Neutro Abs 11.9 (H) 1.7 - 7.7 K/uL   Lymphocytes Relative 16 %   Lymphs Abs 2.5 0.7 - 4.0 K/uL   Monocytes Relative 9 %   Monocytes Absolute 1.5 (H) 0.1 - 1.0 K/uL   Eosinophils Relative 0 %   Eosinophils Absolute 0.0 0.0 - 0.7 K/uL   Basophils Relative 0 %   Basophils Absolute 0.1 0.0 - 0.1 K/uL   Immature Granulocytes 0 %   Abs Immature Granulocytes 0.1 0.0 - 0.1 K/uL    Comment: Performed at [facility] [address], [place], [place] [address]  Comprehensive metabolic panel     Status: Abnormal   Collection Time: 07/27/17  8:25 PM  Result Value Ref Range   Sodium 137 135 - 145 mmol/L   Potassium 3.7 3.5 - 5.1 mmol/L   Chloride 102 101 - 111 mmol/L   CO2 25 22 - 32 mmol/L   Glucose, Bld 120 (H) 65 - 99 mg/dL   BUN 11 6 - 20 mg/dL   Creatinine, Ser 1.22 0.61 - 1.24 mg/dL   Calcium 9.6 8.9 - 10.3 mg/dL   Total Protein 7.5 6.5 - 8.1 g/dL   Albumin 4.1 3.5 - 5.0 g/dL    AST 51 (H) 15 - 41 U/L   ALT 37 17 - 63 U/L   Alkaline Phosphatase 71 38 - 126 U/L   Total Bilirubin 1.1 0.3 - 1.2 mg/dL   GFR calc non Af Amer >60 >60 mL/min   GFR calc Af Amer >60 >60 mL/min    Comment: (NOTE) The eGFR has been calculated  using the CKD EPI equation. This calculation has not been validated in all clinical situations. eGFR's persistently <60 mL/min signify possible Chronic Kidney Disease.    Anion gap 10 5 - 15    Comment: Performed at [facility] [address], [place], [place] [address]  I-Stat Chem 8, ED     Status: Abnormal   Collection Time: 07/27/17  8:36 PM  Result Value Ref Range   Sodium 141 135 - 145 mmol/L   Potassium 3.7 3.5 - 5.1 mmol/L   Chloride 103 101 - 111 mmol/L   BUN 12 6 - 20 mg/dL   Creatinine, Ser 1.00 0.61 - 1.24 mg/dL   Glucose, Bld 118 (H) 65 - 99 mg/dL   Calcium, Ion 1.12 (L) 1.15 - 1.40 mmol/L   TCO2 25 22 - 32 mmol/L   Hemoglobin 18.0 (H) 13.0 - 17.0 g/dL   HCT 53.0 (H) 39.0 - 52.0 %  I-stat troponin, ED     Status: None   Collection Time: 07/27/17  8:38 PM  Result Value Ref Range   Troponin i, poc 0.02 0.00 - 0.08 ng/mL   Comment 3            Comment: Due to the release kinetics of cTnI, a negative result within the first hours of the onset of symptoms does not rule out myocardial infarction with certainty. If myocardial infarction is still suspected, repeat the test at appropriate intervals.   CBG monitoring, ED     Status: None   Collection Time: 07/27/17  9:05 PM  Result Value Ref Range   Glucose-Capillary 94 65 - 99 mg/dL   Comment 1 Notify RN     Ct Angio Head W Or Wo Contrast  Result Date: 07/27/2017 CLINICAL DATA:  Code stroke. Left-sided weakness. Left facial droop. EXAM: CT ANGIOGRAPHY HEAD AND NECK CT PERFUSION BRAIN TECHNIQUE: Multidetector CT imaging of the head and neck was performed using the standard protocol during bolus administration of intravenous contrast. Multiplanar CT image reconstructions  and MIPs were obtained to evaluate the vascular anatomy. Carotid stenosis measurements (when applicable) are obtained utilizing NASCET criteria, using the distal internal carotid diameter as the denominator. Multiphase CT imaging of the brain was performed following IV bolus contrast injection. Subsequent parametric perfusion maps were calculated using RAPID software. CONTRAST:  184m ISOVUE-370 IOPAMIDOL (ISOVUE-370) INJECTION 76% COMPARISON:  CT head without contrast from the same day. FINDINGS: CTA NECK FINDINGS Aortic arch: A 3 vessel arch configuration is present. Atherosclerotic calcifications are present at the origin of the left subclavian artery without a significant stenosis. There is no aneurysm. Right carotid system: Right common carotid artery is within normal limits. There is a high-grade, near occlusive stenosis at the bifurcation. There is marked decrease in caliber of the right cervical ICA with near complete loss of contrast at the skull base. Left carotid system: Left common carotid artery demonstrates mural calcifications anteriorly. There is calcified and noncalcified plaque in the proximal left internal carotid artery. The lumen is narrowed to 2.7 mm approximately 1 cm from the bifurcation. This compares with a more normal distal measurement 4.8 mm. No tandem stenosis is present. Vertebral arteries: The right vertebral artery is the dominant vessel. Both vertebral arteries originate from the subclavian arteries. There is a moderate stenosis of the proximal right vertebral artery relative to the more distal vessel. There is no other significant stenosis of the vertebral arteries in the neck. Skeleton: Vertebral body heights and alignment are maintained. No focal  lytic or blastic lesions are present. Other neck: Soft tissues the neck are otherwise unremarkable. Thyroid is within normal limits. There is no significant adenopathy. Salivary glands are normal. Upper chest: Centrilobular emphysematous  changes are present. No focal nodule or mass lesion is present. There is no airspace disease. Review of the MIP images confirms the above findings CTA HEAD FINDINGS Anterior circulation: A posterior communicating artery is present. There is some reconstitution of the cavernous right internal carotid artery. The petrous segment is markedly narrowed. There is significant narrowing of the supraclinoid right ICA. The right M1 segment is occluded. The right A1 segment is not visualized. Atherosclerotic calcifications are present within the cavernous left internal carotid artery without a significant stenosis through the ICA terminus. The A1 and M1 segments are patent. Anterior communicating artery is patent. Both A2 segments fill from the left. The left MCA bifurcation is intact. Left MCA branch vessels are normal. There is no significant reconstitution of right MCA branch vessels. Very poor collaterals are present. Posterior circulation: The right vertebral artery is the dominant vessel. PICA origins are visualized and normal. The basilar artery is small. The left posterior cerebral artery originates from the basilar tip. The fetal type right posterior cerebral artery is present. Proximal scratched at there is mild narrowing of the left P2 segment prior to the bifurcation. There is moderate attenuation of PCA branch vessels bilaterally. Venous sinuses: The dural sinuses are patent. Straight sinus and deep cerebral veins are intact. Cortical veins are unremarkable. Anatomic variants: Fetal type right posterior cerebral artery. Review of the MIP images confirms the above findings CT Brain Perfusion Findings: CBF (<30%) Volume: 42m Perfusion (Tmax>6.0s) volume: 1332mMismatch Volume: 6412mnfarction Location:Right MCA territory CBF < 30% confirms ASPECTS score of at least 4 and possibly 3. The hypoperfusion index is 0.6 consistent with very poor collaterals and a rapidly progressive infarct. IMPRESSION: 1. Large right MCA  territory infarct.  CBF< 30% is 69 mL. 2. Hypoperfusion index of 0.6 consistent with very poor collaterals and rapidly progressive infarct. 3. 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 4. Marked decreased caliber of the cervical right internal carotid artery distal to the stenosis. 5. Reconstitution of the right internal carotid artery at the ophthalmic segment with some filling of the fetal type right posterior cerebral artery. 6. Occluded right M1 segment with poor collaterals. 7. Atherosclerotic changes at the left carotid bifurcation without significant stenosis. 8. Left MCA and ACA vessels are within normal limits. 9. Small basilar artery feeds the left posterior cerebral artery. The above was relayed via text pager to Dr. EshOrlena Sheldon 07/27/2017 at 21:08 . Electronically Signed   By: ChrSan MorelleD.   On: 07/27/2017 21:19   Ct Angio Neck W Or Wo Contrast  Result Date: 07/27/2017 CLINICAL DATA:  Code stroke. Left-sided weakness. Left facial droop. EXAM: CT ANGIOGRAPHY HEAD AND NECK CT PERFUSION BRAIN TECHNIQUE: Multidetector CT imaging of the head and neck was performed using the standard protocol during bolus administration of intravenous contrast. Multiplanar CT image reconstructions and MIPs were obtained to evaluate the vascular anatomy. Carotid stenosis measurements (when applicable) are obtained utilizing NASCET criteria, using the distal internal carotid diameter as the denominator. Multiphase CT imaging of the brain was performed following IV bolus contrast injection. Subsequent parametric perfusion maps were calculated using RAPID software. CONTRAST:  100m79mOVUE-370 IOPAMIDOL (ISOVUE-370) INJECTION 76% COMPARISON:  CT head without contrast from the same day. FINDINGS: CTA  NECK FINDINGS Aortic arch: A 3 vessel arch configuration is present. Atherosclerotic calcifications are present at the origin of the left subclavian  artery without a significant stenosis. There is no aneurysm. Right carotid system: Right common carotid artery is within normal limits. There is a high-grade, near occlusive stenosis at the bifurcation. There is marked decrease in caliber of the right cervical ICA with near complete loss of contrast at the skull base. Left carotid system: Left common carotid artery demonstrates mural calcifications anteriorly. There is calcified and noncalcified plaque in the proximal left internal carotid artery. The lumen is narrowed to 2.7 mm approximately 1 cm from the bifurcation. This compares with a more normal distal measurement 4.8 mm. No tandem stenosis is present. Vertebral arteries: The right vertebral artery is the dominant vessel. Both vertebral arteries originate from the subclavian arteries. There is a moderate stenosis of the proximal right vertebral artery relative to the more distal vessel. There is no other significant stenosis of the vertebral arteries in the neck. Skeleton: Vertebral body heights and alignment are maintained. No focal lytic or blastic lesions are present. Other neck: Soft tissues the neck are otherwise unremarkable. Thyroid is within normal limits. There is no significant adenopathy. Salivary glands are normal. Upper chest: Centrilobular emphysematous changes are present. No focal nodule or mass lesion is present. There is no airspace disease. Review of the MIP images confirms the above findings CTA HEAD FINDINGS Anterior circulation: A posterior communicating artery is present. There is some reconstitution of the cavernous right internal carotid artery. The petrous segment is markedly narrowed. There is significant narrowing of the supraclinoid right ICA. The right M1 segment is occluded. The right A1 segment is not visualized. Atherosclerotic calcifications are present within the cavernous left internal carotid artery without a significant stenosis through the ICA terminus. The A1 and M1  segments are patent. Anterior communicating artery is patent. Both A2 segments fill from the left. The left MCA bifurcation is intact. Left MCA branch vessels are normal. There is no significant reconstitution of right MCA branch vessels. Very poor collaterals are present. Posterior circulation: The right vertebral artery is the dominant vessel. PICA origins are visualized and normal. The basilar artery is small. The left posterior cerebral artery originates from the basilar tip. The fetal type right posterior cerebral artery is present. Proximal scratched at there is mild narrowing of the left P2 segment prior to the bifurcation. There is moderate attenuation of PCA branch vessels bilaterally. Venous sinuses: The dural sinuses are patent. Straight sinus and deep cerebral veins are intact. Cortical veins are unremarkable. Anatomic variants: Fetal type right posterior cerebral artery. Review of the MIP images confirms the above findings CT Brain Perfusion Findings: CBF (<30%) Volume: 50m Perfusion (Tmax>6.0s) volume: 1346mMismatch Volume: 6415mnfarction Location:Right MCA territory CBF < 30% confirms ASPECTS score of at least 4 and possibly 3. The hypoperfusion index is 0.6 consistent with very poor collaterals and a rapidly progressive infarct. IMPRESSION: 1. Large right MCA territory infarct.  CBF< 30% is 69 mL. 2. Hypoperfusion index of 0.6 consistent with very poor collaterals and rapidly progressive infarct. 3. 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 4. Marked decreased caliber of the cervical right internal carotid artery distal to the stenosis. 5. Reconstitution of the right internal carotid artery at the ophthalmic segment with some filling of the fetal type right posterior cerebral artery. 6. Occluded right M1 segment with poor collaterals. 7. Atherosclerotic changes at  the left carotid bifurcation without significant stenosis. 8.  Left MCA and ACA vessels are within normal limits. 9. Small basilar artery feeds the left posterior cerebral artery. The above was relayed via text pager to Dr. Orlena Sheldon on 07/27/2017 at 21:08 . Electronically Signed   By: San Morelle M.D.   On: 07/27/2017 21:19   Ct Cerebral Perfusion W Contrast  Result Date: 07/27/2017 CLINICAL DATA:  Code stroke. Left-sided weakness. Left facial droop. EXAM: CT ANGIOGRAPHY HEAD AND NECK CT PERFUSION BRAIN TECHNIQUE: Multidetector CT imaging of the head and neck was performed using the standard protocol during bolus administration of intravenous contrast. Multiplanar CT image reconstructions and MIPs were obtained to evaluate the vascular anatomy. Carotid stenosis measurements (when applicable) are obtained utilizing NASCET criteria, using the distal internal carotid diameter as the denominator. Multiphase CT imaging of the brain was performed following IV bolus contrast injection. Subsequent parametric perfusion maps were calculated using RAPID software. CONTRAST:  166m ISOVUE-370 IOPAMIDOL (ISOVUE-370) INJECTION 76% COMPARISON:  CT head without contrast from the same day. FINDINGS: CTA NECK FINDINGS Aortic arch: A 3 vessel arch configuration is present. Atherosclerotic calcifications are present at the origin of the left subclavian artery without a significant stenosis. There is no aneurysm. Right carotid system: Right common carotid artery is within normal limits. There is a high-grade, near occlusive stenosis at the bifurcation. There is marked decrease in caliber of the right cervical ICA with near complete loss of contrast at the skull base. Left carotid system: Left common carotid artery demonstrates mural calcifications anteriorly. There is calcified and noncalcified plaque in the proximal left internal carotid artery. The lumen is narrowed to 2.7 mm approximately 1 cm from the bifurcation. This compares with a more normal distal measurement 4.8 mm. No tandem  stenosis is present. Vertebral arteries: The right vertebral artery is the dominant vessel. Both vertebral arteries originate from the subclavian arteries. There is a moderate stenosis of the proximal right vertebral artery relative to the more distal vessel. There is no other significant stenosis of the vertebral arteries in the neck. Skeleton: Vertebral body heights and alignment are maintained. No focal lytic or blastic lesions are present. Other neck: Soft tissues the neck are otherwise unremarkable. Thyroid is within normal limits. There is no significant adenopathy. Salivary glands are normal. Upper chest: Centrilobular emphysematous changes are present. No focal nodule or mass lesion is present. There is no airspace disease. Review of the MIP images confirms the above findings CTA HEAD FINDINGS Anterior circulation: A posterior communicating artery is present. There is some reconstitution of the cavernous right internal carotid artery. The petrous segment is markedly narrowed. There is significant narrowing of the supraclinoid right ICA. The right M1 segment is occluded. The right A1 segment is not visualized. Atherosclerotic calcifications are present within the cavernous left internal carotid artery without a significant stenosis through the ICA terminus. The A1 and M1 segments are patent. Anterior communicating artery is patent. Both A2 segments fill from the left. The left MCA bifurcation is intact. Left MCA branch vessels are normal. There is no significant reconstitution of right MCA branch vessels. Very poor collaterals are present. Posterior circulation: The right vertebral artery is the dominant vessel. PICA origins are visualized and normal. The basilar artery is small. The left posterior cerebral artery originates from the basilar tip. The fetal type right posterior cerebral artery is present. Proximal scratched at there is mild narrowing of the left P2 segment prior to the bifurcation. There is  moderate attenuation  of PCA branch vessels bilaterally. Venous sinuses: The dural sinuses are patent. Straight sinus and deep cerebral veins are intact. Cortical veins are unremarkable. Anatomic variants: Fetal type right posterior cerebral artery. Review of the MIP images confirms the above findings CT Brain Perfusion Findings: CBF (<30%) Volume: 68m Perfusion (Tmax>6.0s) volume: 1383mMismatch Volume: 6461mnfarction Location:Right MCA territory CBF < 30% confirms ASPECTS score of at least 4 and possibly 3. The hypoperfusion index is 0.6 consistent with very poor collaterals and a rapidly progressive infarct. IMPRESSION: 1. Large right MCA territory infarct.  CBF< 30% is 69 mL. 2. Hypoperfusion index of 0.6 consistent with very poor collaterals and rapidly progressive infarct. 3. 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 4. Marked decreased caliber of the cervical right internal carotid artery distal to the stenosis. 5. Reconstitution of the right internal carotid artery at the ophthalmic segment with some filling of the fetal type right posterior cerebral artery. 6. Occluded right M1 segment with poor collaterals. 7. Atherosclerotic changes at the left carotid bifurcation without significant stenosis. 8. Left MCA and ACA vessels are within normal limits. 9. Small basilar artery feeds the left posterior cerebral artery. The above was relayed via text pager to Dr. EshOrlena Sheldon 07/27/2017 at 21:08 . Electronically Signed   By: ChrSan MorelleD.   On: 07/27/2017 21:19   Ct Head Code Stroke Wo Contrast  Result Date: 07/27/2017 CLINICAL DATA:  Code stroke. Acute onset of left-sided weakness and facial droop beginning at 7 a.m. today, 13.5 hours ago. EXAM: CT HEAD WITHOUT CONTRAST TECHNIQUE: Contiguous axial images were obtained from the base of the skull through the vertex without intravenous contrast. COMPARISON:  None. FINDINGS: Brain:  There is diffuse loss of gray-white differentiation in the right temporal lobe. There is hypoattenuation along the right insular cortex and the right lentiform nucleus. Hypoattenuation involves the right caudate head and anterior limb internal capsule. Subcortical white matter hypoattenuation is present in the super ganglionic region without additional cortical lesions. There is partial effacement of the right lateral ventricle and sulci on the right. The brainstem and cerebellum are within normal limits. Vascular: Atherosclerotic changes are present within the cavernous internal carotid arteries bilaterally. A hyperdense right MCA. Hyperdense vessels are seen beyond bifurcation. Skull: Calvarium is intact. No focal lytic or blastic lesions are present. Sinuses/Orbits: Mild mucosal thickening is present in the left maxillary sinus. There is opacification of a single right ethmoid air cell. No fluid levels are present. Globes and orbits are within normal limits. ASPECTS ([facility]) - Ganglionic level infarction (caudate, lentiform nuclei, internal capsule, insula, M1-M3 cortex): 1/7 - Supraganglionic infarction (M4-M6 cortex): 3/3 Total score (0-10 with 10 being normal): 4/7 IMPRESSION: 1. Large right MCA territory infarct involving the insular cortex, lentiform nucleus, anterior limb internal capsule, caudate head, and right temporal lobe. This results in some mass effect with effacement of the sulci and right lateral ventricle but no hemorrhage. 2. Hyperdense right MCA extending beyond the bifurcation compatible with thrombus. 3. Mild diffuse subcortical white matter hypoattenuation bilaterally likely reflects microvascular ischemia. 4. ASPECTS is 4/10 The above was relayed via text pager to Dr. ESHOrlena Sheldon 07/27/2017 at 20:40 . Electronically Signed   By: ChrSan MorelleD.   On: 07/27/2017 20:42    ROS Blood pressure (!) 174/104, pulse (!) 109, temperature 100 F (37.8 C),  temperature source Temporal, resp. rate (!) 23, height '5\' 5"'  (1.651 m),  weight 76.3 kg (168 lb 3.2 oz). Neurologic Examination:  Awake, alert, oriented. Left face, arm, and leg weakness. Sensation - inconsistent. Coord- cannot use left side, but normal right. Left babinski.  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  He arrived outside the window for IV tpA.  I spoke to Dr. Estanislado Pandy for mechanical thrombectomy about 14 hours after onset.  Due to the low ASPECTS score, he felt the risk of hemorrhage would be too high and declined the procedure.  Patient will be admitted to the hospital.  Rectal ASA 300 mg until passes swallow study. Permissive hypertensive to SBP 220 and DBD 120.   Treat above former parameter with Labetalol IV q2hr prn. PT/OT/ST.  Will likely need inpatient rehab. He is at high risk of malignant right hemisphere edema which may require decompressive hemicraniectomy.   Risk factor modification over time.  TTE tomorrow.  Rogue Jury, MD 07/27/2017, 9:31 PM

## 2017-07-27 NOTE — ED Provider Notes (Signed)
Emergency Department Provider Note   I have reviewed the triage vital signs and the nursing notes.   HISTORY  Chief Complaint Code Stroke   HPI [name] [name] is a 52 y.o. male with PMH of GERD and tobacco use presents to the emergency department for evaluation of acute onset left-sided weakness.  Patient last normal at 7 AM today.  He awoke from sleep feeling normal and began walking to the bathroom when he suddenly fell to the ground.  He has been having left arm and leg weakness since that time.  Patient states that the event happened while walking.  He states he took several normal steps before having the weakness began.  He denies any vision symptoms or difficulty swallowing.  He delayed coming to the emergency department but when symptoms persisted EMS was called he was transported.  EMS activated a code stroke in route with concern that the patient was VAN positive.  Patient denies any headache symptoms. No radiation of symptoms or modifying factors.    Past Medical History:  Diagnosis Date  . GERD (gastroesophageal reflux disease)   . Hypertension   . Nephrolithiasis   . Tobacco abuse     Patient Active Problem List   Diagnosis Date Noted  . Staghorn calculus 08/24/2013  . Hemorrhoids, internal, with bleeding and Grade 2 prolapse 08/24/2013    Past Surgical History:  Procedure Laterality Date  . CYSTOSCOPY W/ URETERAL STENT PLACEMENT Bilateral 09/07/2013   Procedure: CYSTOSCOPY WITH RETROGRADE PYELOGRAM/URETERAL STENT PLACEMENT;  Surgeon: Sebastian Ache, MD;  Location: [facility];  Service: Urology;  Laterality: Bilateral;  . CYSTOSCOPY WITH RETROGRADE PYELOGRAM, URETEROSCOPY AND STENT PLACEMENT Bilateral 09/09/2013   Procedure: CYSTOSCOPY WITH RETROGRADE PYELOGRAM, DIAGNOSTIC URETEROSCOPY AND STENT CHANGE;  Surgeon: Sebastian Ache, MD;  Location: [facility];  Service: Urology;  Laterality: Bilateral;  . HOLMIUM LASER APPLICATION Right 09/07/2013   Procedure: HOLMIUM LASER  APPLICATION;  Surgeon: Sebastian Ache, MD;  Location: [facility];  Service: Urology;  Laterality: Right;  . HOLMIUM LASER APPLICATION Right 09/09/2013   Procedure: HOLMIUM LASER APPLICATION;  Surgeon: Sebastian Ache, MD;  Location: [facility];  Service: Urology;  Laterality: Right;  . NEPHROLITHOTOMY Right 09/07/2013   Procedure: FIRST STAGE RIGHT PERCUTANEOUS NEPHROLITHOTOMY  WITH ACCESS, ;  Surgeon: Sebastian Ache, MD;  Location: [facility];  Service: Urology;  Laterality: Right;  . NEPHROLITHOTOMY Right 09/09/2013   Procedure: RIGHT 2ND STAGE NEPHROLITHOTOMY PERCUTANEOUS;  Surgeon: Sebastian Ache, MD;  Location: [facility];  Service: Urology;  Laterality: Right;  . UMBILICAL HERNIA REPAIR      Current Outpatient Rx  . Order #: [id number] Class: Normal  . Order #: [id number] Class: Print  . Order #: [id number] Class: Historical Med  . Order #: [id number] Class: Print  . Order #: [id number] Class: Normal  . Order #: [id number] Class: Historical Med    Allergies Patient has no known allergies.  Family History  Problem Relation Age of Onset  . Prostate cancer Father 6  . Lung cancer Father   . Nephrolithiasis Father   . Ovarian cancer Mother   . CVA Paternal Grandmother        in her 13's  . Cancer Paternal Grandmother        eye  . Diabetes Maternal Grandmother   . Alcoholism Maternal Uncle        x 3    Social History Social History   Tobacco Use  . Smoking status: Current Every Day Smoker    Packs/day: 1.00    Years: 25.00  Pack years: 25.00    Types: Cigarettes  . Smokeless tobacco: Never Used  Substance Use Topics  . Alcohol use: Yes    Comment: 12 mixed drinks per week   . Drug use: Yes    Types: Marijuana    Review of Systems  Constitutional: No fever/chills Eyes: No visual changes. ENT: No sore throat. Cardiovascular: Denies chest pain. Respiratory: Denies shortness of breath. Gastrointestinal: No abdominal pain.  No nausea, no vomiting.  No diarrhea.  No  constipation. Genitourinary: Negative for dysuria. Musculoskeletal: Negative for back pain. Skin: Negative for rash. Neurological: Negative for headaches. Positive left sided weakness in arm/leg.   10-point ROS otherwise negative.  ____________________________________________   PHYSICAL EXAM:  VITAL SIGNS: Vitals:   07/27/17 2230 07/27/17 2300  BP: (!) 149/86 (!) 120/92  Pulse: (!) 102 93  Resp: (!) 32 (!) 25  Temp:    SpO2: 95% 95%    Constitutional: Alert and oriented. Well appearing and in no acute distress. Eyes: Conjunctivae are normal. PERRL. EOMI. Head: Atraumatic. Nose: No congestion/rhinnorhea. Mouth/Throat: Mucous membranes are moist.  Neck: No stridor.  Cardiovascular: Normal rate, regular rhythm. Good peripheral circulation. Grossly normal heart sounds.   Respiratory: Normal respiratory effort.  No retractions. Lungs CTAB. Gastrointestinal: Soft and nontender. No distention.  Musculoskeletal: No lower extremity tenderness nor edema. No gross deformities of extremities. Neurologic:  Normal speech and language. 2/5 strength in the left upper and lower extremity. Normal CN exam 2-12.  Skin:  Skin is warm, dry and intact. No rash noted.  ____________________________________________   LABS (all labs ordered are listed, but only abnormal results are displayed)  Labs Reviewed  CBC - Abnormal; Notable for the following components:      Result Value   WBC 16.1 (*)    Hemoglobin 18.0 (*)    HCT 52.2 (*)    All other components within normal limits  DIFFERENTIAL - Abnormal; Notable for the following components:   Neutro Abs 11.9 (*)    Monocytes Absolute 1.5 (*)    All other components within normal limits  COMPREHENSIVE METABOLIC PANEL - Abnormal; Notable for the following components:   Glucose, Bld 120 (*)    AST 51 (*)    All other components within normal limits  I-STAT CHEM 8, ED - Abnormal; Notable for the following components:   Glucose, Bld 118 (*)     Calcium, Ion 1.12 (*)    Hemoglobin 18.0 (*)    HCT 53.0 (*)    All other components within normal limits  PROTIME-INR  APTT  I-STAT TROPONIN, ED  CBG MONITORING, ED   ____________________________________________  EKG   EKG Interpretation  Date/Time:  Saturday Jul 27 2017 21:02:24 EDT Ventricular Rate:  100 PR Interval:    QRS Duration: 81 QT Interval:  339 QTC Calculation: 438 R Axis:   68 Text Interpretation:  Sinus tachycardia Borderline low voltage, extremity leads No STEMI.  Confirmed by Alona Bene [phone]) on 07/27/2017 9:11:55 PM       ____________________________________________  RADIOLOGY  Ct Angio Head W Or Wo Contrast  Result Date: 07/27/2017 CLINICAL DATA:  Code stroke. Left-sided weakness. Left facial droop. EXAM: CT ANGIOGRAPHY HEAD AND NECK CT PERFUSION BRAIN TECHNIQUE: Multidetector CT imaging of the head and neck was performed using the standard protocol during bolus administration of intravenous contrast. Multiplanar CT image reconstructions and MIPs were obtained to evaluate the vascular anatomy. Carotid stenosis measurements (when applicable) are obtained utilizing NASCET criteria, using the distal internal carotid  diameter as the denominator. Multiphase CT imaging of the brain was performed following IV bolus contrast injection. Subsequent parametric perfusion maps were calculated using RAPID software. CONTRAST:  ISOVUE-370 IOPAMIDOL (ISOVUE-370) INJECTION 76% COMPARISON:  CT head without contrast from the same day. FINDINGS: CTA NECK FINDINGS Aortic arch: A 3 vessel arch configuration is present. Atherosclerotic calcifications are present at the origin of the left subclavian artery without a significant stenosis. There is no aneurysm. Right carotid system: Right common carotid artery is within normal limits. There is a high-grade, near occlusive stenosis at the bifurcation. There is marked decrease in caliber of the right cervical ICA with near complete  loss of contrast at the skull base. Left carotid system: Left common carotid artery demonstrates mural calcifications anteriorly. There is calcified and noncalcified plaque in the proximal left internal carotid artery. The lumen is narrowed to 2.7 mm approximately 1 cm from the bifurcation. This compares with a more normal distal measurement 4.8 mm. No tandem stenosis is present. Vertebral arteries: The right vertebral artery is the dominant vessel. Both vertebral arteries originate from the subclavian arteries. There is a moderate stenosis of the proximal right vertebral artery relative to the more distal vessel. There is no other significant stenosis of the vertebral arteries in the neck. Skeleton: Vertebral body heights and alignment are maintained. No focal lytic or blastic lesions are present. Other neck: Soft tissues the neck are otherwise unremarkable. Thyroid is within normal limits. There is no significant adenopathy. Salivary glands are normal. Upper chest: Centrilobular emphysematous changes are present. No focal nodule or mass lesion is present. There is no airspace disease. Review of the MIP images confirms the above findings CTA HEAD FINDINGS Anterior circulation: A posterior communicating artery is present. There is some reconstitution of the cavernous right internal carotid artery. The petrous segment is markedly narrowed. There is significant narrowing of the supraclinoid right ICA. The right M1 segment is occluded. The right A1 segment is not visualized. Atherosclerotic calcifications are present within the cavernous left internal carotid artery without a significant stenosis through the ICA terminus. The A1 and M1 segments are patent. Anterior communicating artery is patent. Both A2 segments fill from the left. The left MCA bifurcation is intact. Left MCA branch vessels are normal. There is no significant reconstitution of right MCA branch vessels. Very poor collaterals are present. Posterior  circulation: The right vertebral artery is the dominant vessel. PICA origins are visualized and normal. The basilar artery is small. The left posterior cerebral artery originates from the basilar tip. The fetal type right posterior cerebral artery is present. Proximal scratched at there is mild narrowing of the left P2 segment prior to the bifurcation. There is moderate attenuation of PCA branch vessels bilaterally. Venous sinuses: The dural sinuses are patent. Straight sinus and deep cerebral veins are intact. Cortical veins are unremarkable. Anatomic variants: Fetal type right posterior cerebral artery. Review of the MIP images confirms the above findings CT Brain Perfusion Findings: CBF (<30%) Volume: 69mL Perfusion (Tmax>6.0s) volume: Mismatch Volume: 64mL Infarction Location:Right MCA territory CBF < 30% confirms ASPECTS score of at least 4 and possibly 3. The hypoperfusion index is 0.6 consistent with very poor collaterals and a rapidly progressive infarct. IMPRESSION: 1. Large right MCA territory infarct.  CBF< 30% is 69 mL. 2. Hypoperfusion index of 0.6 consistent with very poor collaterals and rapidly progressive infarct. 3. 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 4. Marked decreased caliber  of the cervical right internal carotid artery distal to the stenosis. 5. Reconstitution of the right internal carotid artery at the ophthalmic segment with some filling of the fetal type right posterior cerebral artery. 6. Occluded right M1 segment with poor collaterals. 7. Atherosclerotic changes at the left carotid bifurcation without significant stenosis. 8. Left MCA and ACA vessels are within normal limits. 9. Small basilar artery feeds the left posterior cerebral artery. The above was relayed via text pager to Dr. Nicholas Lose on 07/27/2017 at 21:08 . Electronically Signed   By: Marin Roberts M.D.   On: 07/27/2017 21:19   Ct Angio  Neck W Or Wo Contrast  Result Date: 07/27/2017 CLINICAL DATA:  Code stroke. Left-sided weakness. Left facial droop. EXAM: CT ANGIOGRAPHY HEAD AND NECK CT PERFUSION BRAIN TECHNIQUE: Multidetector CT imaging of the head and neck was performed using the standard protocol during bolus administration of intravenous contrast. Multiplanar CT image reconstructions and MIPs were obtained to evaluate the vascular anatomy. Carotid stenosis measurements (when applicable) are obtained utilizing NASCET criteria, using the distal internal carotid diameter as the denominator. Multiphase CT imaging of the brain was performed following IV bolus contrast injection. Subsequent parametric perfusion maps were calculated using RAPID software. CONTRAST:  ISOVUE-370 IOPAMIDOL (ISOVUE-370) INJECTION 76% COMPARISON:  CT head without contrast from the same day. FINDINGS: CTA NECK FINDINGS Aortic arch: A 3 vessel arch configuration is present. Atherosclerotic calcifications are present at the origin of the left subclavian artery without a significant stenosis. There is no aneurysm. Right carotid system: Right common carotid artery is within normal limits. There is a high-grade, near occlusive stenosis at the bifurcation. There is marked decrease in caliber of the right cervical ICA with near complete loss of contrast at the skull base. Left carotid system: Left common carotid artery demonstrates mural calcifications anteriorly. There is calcified and noncalcified plaque in the proximal left internal carotid artery. The lumen is narrowed to 2.7 mm approximately 1 cm from the bifurcation. This compares with a more normal distal measurement 4.8 mm. No tandem stenosis is present. Vertebral arteries: The right vertebral artery is the dominant vessel. Both vertebral arteries originate from the subclavian arteries. There is a moderate stenosis of the proximal right vertebral artery relative to the more distal vessel. There is no other  significant stenosis of the vertebral arteries in the neck. Skeleton: Vertebral body heights and alignment are maintained. No focal lytic or blastic lesions are present. Other neck: Soft tissues the neck are otherwise unremarkable. Thyroid is within normal limits. There is no significant adenopathy. Salivary glands are normal. Upper chest: Centrilobular emphysematous changes are present. No focal nodule or mass lesion is present. There is no airspace disease. Review of the MIP images confirms the above findings CTA HEAD FINDINGS Anterior circulation: A posterior communicating artery is present. There is some reconstitution of the cavernous right internal carotid artery. The petrous segment is markedly narrowed. There is significant narrowing of the supraclinoid right ICA. The right M1 segment is occluded. The right A1 segment is not visualized. Atherosclerotic calcifications are present within the cavernous left internal carotid artery without a significant stenosis through the ICA terminus. The A1 and M1 segments are patent. Anterior communicating artery is patent. Both A2 segments fill from the left. The left MCA bifurcation is intact. Left MCA branch vessels are normal. There is no significant reconstitution of right MCA branch vessels. Very poor collaterals are present. Posterior circulation: The right vertebral artery is the dominant vessel. PICA origins are  visualized and normal. The basilar artery is small. The left posterior cerebral artery originates from the basilar tip. The fetal type right posterior cerebral artery is present. Proximal scratched at there is mild narrowing of the left P2 segment prior to the bifurcation. There is moderate attenuation of PCA branch vessels bilaterally. Venous sinuses: The dural sinuses are patent. Straight sinus and deep cerebral veins are intact. Cortical veins are unremarkable. Anatomic variants: Fetal type right posterior cerebral artery. Review of the MIP images  confirms the above findings CT Brain Perfusion Findings: CBF (<30%) Volume: 69mL Perfusion (Tmax>6.0s) volume: Mismatch Volume: 64mL Infarction Location:Right MCA territory CBF < 30% confirms ASPECTS score of at least 4 and possibly 3. The hypoperfusion index is 0.6 consistent with very poor collaterals and a rapidly progressive infarct. IMPRESSION: 1. Large right MCA territory infarct.  CBF< 30% is 69 mL. 2. Hypoperfusion index of 0.6 consistent with very poor collaterals and rapidly progressive infarct. 3. 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 4. Marked decreased caliber of the cervical right internal carotid artery distal to the stenosis. 5. Reconstitution of the right internal carotid artery at the ophthalmic segment with some filling of the fetal type right posterior cerebral artery. 6. Occluded right M1 segment with poor collaterals. 7. Atherosclerotic changes at the left carotid bifurcation without significant stenosis. 8. Left MCA and ACA vessels are within normal limits. 9. Small basilar artery feeds the left posterior cerebral artery. The above was relayed via text pager to Dr. Nicholas Lose on 07/27/2017 at 21:08 . Electronically Signed   By: Marin Roberts M.D.   On: 07/27/2017 21:19   Ct Cerebral Perfusion W Contrast  Result Date: 07/27/2017 CLINICAL DATA:  Code stroke. Left-sided weakness. Left facial droop. EXAM: CT ANGIOGRAPHY HEAD AND NECK CT PERFUSION BRAIN TECHNIQUE: Multidetector CT imaging of the head and neck was performed using the standard protocol during bolus administration of intravenous contrast. Multiplanar CT image reconstructions and MIPs were obtained to evaluate the vascular anatomy. Carotid stenosis measurements (when applicable) are obtained utilizing NASCET criteria, using the distal internal carotid diameter as the denominator. Multiphase CT imaging of the brain was performed following IV bolus  contrast injection. Subsequent parametric perfusion maps were calculated using RAPID software. CONTRAST:  ISOVUE-370 IOPAMIDOL (ISOVUE-370) INJECTION 76% COMPARISON:  CT head without contrast from the same day. FINDINGS: CTA NECK FINDINGS Aortic arch: A 3 vessel arch configuration is present. Atherosclerotic calcifications are present at the origin of the left subclavian artery without a significant stenosis. There is no aneurysm. Right carotid system: Right common carotid artery is within normal limits. There is a high-grade, near occlusive stenosis at the bifurcation. There is marked decrease in caliber of the right cervical ICA with near complete loss of contrast at the skull base. Left carotid system: Left common carotid artery demonstrates mural calcifications anteriorly. There is calcified and noncalcified plaque in the proximal left internal carotid artery. The lumen is narrowed to 2.7 mm approximately 1 cm from the bifurcation. This compares with a more normal distal measurement 4.8 mm. No tandem stenosis is present. Vertebral arteries: The right vertebral artery is the dominant vessel. Both vertebral arteries originate from the subclavian arteries. There is a moderate stenosis of the proximal right vertebral artery relative to the more distal vessel. There is no other significant stenosis of the vertebral arteries in the neck. Skeleton: Vertebral body heights and alignment are maintained. No focal lytic or blastic lesions are  present. Other neck: Soft tissues the neck are otherwise unremarkable. Thyroid is within normal limits. There is no significant adenopathy. Salivary glands are normal. Upper chest: Centrilobular emphysematous changes are present. No focal nodule or mass lesion is present. There is no airspace disease. Review of the MIP images confirms the above findings CTA HEAD FINDINGS Anterior circulation: A posterior communicating artery is present. There is some reconstitution of the  cavernous right internal carotid artery. The petrous segment is markedly narrowed. There is significant narrowing of the supraclinoid right ICA. The right M1 segment is occluded. The right A1 segment is not visualized. Atherosclerotic calcifications are present within the cavernous left internal carotid artery without a significant stenosis through the ICA terminus. The A1 and M1 segments are patent. Anterior communicating artery is patent. Both A2 segments fill from the left. The left MCA bifurcation is intact. Left MCA branch vessels are normal. There is no significant reconstitution of right MCA branch vessels. Very poor collaterals are present. Posterior circulation: The right vertebral artery is the dominant vessel. PICA origins are visualized and normal. The basilar artery is small. The left posterior cerebral artery originates from the basilar tip. The fetal type right posterior cerebral artery is present. Proximal scratched at there is mild narrowing of the left P2 segment prior to the bifurcation. There is moderate attenuation of PCA branch vessels bilaterally. Venous sinuses: The dural sinuses are patent. Straight sinus and deep cerebral veins are intact. Cortical veins are unremarkable. Anatomic variants: Fetal type right posterior cerebral artery. Review of the MIP images confirms the above findings CT Brain Perfusion Findings: CBF (<30%) Volume: 69mL Perfusion (Tmax>6.0s) volume: Mismatch Volume: 64mL Infarction Location:Right MCA territory CBF < 30% confirms ASPECTS score of at least 4 and possibly 3. The hypoperfusion index is 0.6 consistent with very poor collaterals and a rapidly progressive infarct. IMPRESSION: 1. Large right MCA territory infarct.  CBF< 30% is 69 mL. 2. Hypoperfusion index of 0.6 consistent with very poor collaterals and rapidly progressive infarct. 3. 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 4. Marked  decreased caliber of the cervical right internal carotid artery distal to the stenosis. 5. Reconstitution of the right internal carotid artery at the ophthalmic segment with some filling of the fetal type right posterior cerebral artery. 6. Occluded right M1 segment with poor collaterals. 7. Atherosclerotic changes at the left carotid bifurcation without significant stenosis. 8. Left MCA and ACA vessels are within normal limits. 9. Small basilar artery feeds the left posterior cerebral artery. The above was relayed via text pager to Dr. Nicholas Lose on 07/27/2017 at 21:08 . Electronically Signed   By: Marin Roberts M.D.   On: 07/27/2017 21:19   Dg Foot 2 Views Left  Result Date: 07/27/2017 CLINICAL DATA:  Fall with foot pain EXAM: LEFT FOOT - 2 VIEW COMPARISON:  None. FINDINGS: There is no evidence of fracture or dislocation. There is no evidence of arthropathy or other focal bone abnormality. Soft tissues are unremarkable. IMPRESSION: Negative. Electronically Signed   By: Deatra Robinson M.D.   On: 07/27/2017 22:56   Ct Head Code Stroke Wo Contrast  Result Date: 07/27/2017 CLINICAL DATA:  Code stroke. Acute onset of left-sided weakness and facial droop beginning at 7 a.m. today, 13.5 hours ago. EXAM: CT HEAD WITHOUT CONTRAST TECHNIQUE: Contiguous axial images were obtained from the base of the skull through the vertex without intravenous contrast. COMPARISON:  None. FINDINGS: Brain: There is diffuse loss  of gray-white differentiation in the right temporal lobe. There is hypoattenuation along the right insular cortex and the right lentiform nucleus. Hypoattenuation involves the right caudate head and anterior limb internal capsule. Subcortical white matter hypoattenuation is present in the super ganglionic region without additional cortical lesions. There is partial effacement of the right lateral ventricle and sulci on the right. The brainstem and cerebellum are within normal limits. Vascular:  Atherosclerotic changes are present within the cavernous internal carotid arteries bilaterally. A hyperdense right MCA. Hyperdense vessels are seen beyond bifurcation. Skull: Calvarium is intact. No focal lytic or blastic lesions are present. Sinuses/Orbits: Mild mucosal thickening is present in the left maxillary sinus. There is opacification of a single right ethmoid air cell. No fluid levels are present. Globes and orbits are within normal limits. ASPECTS [facility] Stroke Program Early CT Score) - Ganglionic level infarction (caudate, lentiform nuclei, internal capsule, insula, M1-M3 cortex): 1/7 - Supraganglionic infarction (M4-M6 cortex): 3/3 Total score (0-10 with 10 being normal): 4/7 IMPRESSION: 1. Large right MCA territory infarct involving the insular cortex, lentiform nucleus, anterior limb internal capsule, caudate head, and right temporal lobe. This results in some mass effect with effacement of the sulci and right lateral ventricle but no hemorrhage. 2. Hyperdense right MCA extending beyond the bifurcation compatible with thrombus. 3. Mild diffuse subcortical white matter hypoattenuation bilaterally likely reflects microvascular ischemia. 4. ASPECTS is 4/10 The above was relayed via text pager to Dr. Nicholas Lose on 07/27/2017 at 20:40 . Electronically Signed   By: Marin Roberts M.D.   On: 07/27/2017 20:42    ____________________________________________   PROCEDURES  Procedure(s) performed:   .Critical Care Performed by: Maia Plan, MD Authorized by: Maia Plan, MD   Critical care provider statement:    Critical care time (minutes):  35   Critical care time was exclusive of:  Separately billable procedures and treating other patients and teaching time   Critical care was necessary to treat or prevent imminent or life-threatening deterioration of the following conditions:  CNS failure or compromise   Critical care was time spent personally by me on the following activities:   Blood draw for specimens, development of treatment plan with patient or surrogate, discussions with consultants, evaluation of patient's response to treatment, examination of patient, obtaining history from patient or surrogate, ordering and performing treatments and interventions, ordering and review of laboratory studies, ordering and review of radiographic studies, pulse oximetry, re-evaluation of patient's condition and review of old charts   I assumed direction of critical care for this patient from another provider in my specialty: no       ____________________________________________   INITIAL IMPRESSION / ASSESSMENT AND PLAN / ED COURSE  Pertinent labs & imaging results that were available during my care of the patient were reviewed by me and considered in my medical decision making (see chart for details).  Patient presents to the emergency department as a code stroke activated by EMS.  There is some concern for possible large vessel occlusion.  On my evaluation the patient has decreased motor to to the LUE and LLE. No CN deficits noted. Patient is outside of tPA window but significant concern for LVO. Patient sent to CT. I placed Code Stroke orders along with angio and perfusion CTs. Neurology to see in CT. Patient airway clear.   Reviewed the patient's labs and CT imaging. Discussed the case with Neurology. Patient will not receive intravascular clot retrieval. Plan for admit to hospitalist. Patient complaining of left foot  pain on my reassessment. Ordered and reviewed plain film of the left foot without fracture or dislocation. No ankle/knee pain.   Discussed patient's case with Hospitalist to request admission. Patient and family (if present) updated with plan. Care transferred to Hospitalist service.  I reviewed all nursing notes, vitals, pertinent old records, EKGs, labs, imaging (as available).  ____________________________________________  FINAL CLINICAL IMPRESSION(S) / ED  DIAGNOSES  Final diagnoses:  Cerebral infarction due to embolism of right middle cerebral artery (HCC)    MEDICATIONS GIVEN DURING THIS VISIT:  Medications  iopamidol (ISOVUE-370) 76 % injection 100 mL (100 mLs Intravenous Contrast Given 07/27/17 2038)    Note:  This document was prepared using Dragon voice recognition software and may include unintentional dictation errors.  Alona Bene, MD Emergency Medicine    Long, Arlyss Repress, MD 07/27/17 2329

## 2017-07-27 NOTE — ED Triage Notes (Signed)
Pt arrived from home with EMS. Pt woke up this morning at 0700 and felt normal. Pt. Stated he walked to the bathroom, became weak on his left side and fell down. Family found pt at 0800 but pt refused to come to hospital at that time. Pt had left sided weakness on leg and arm along with left sided facial droop.

## 2017-07-27 NOTE — ED Notes (Signed)
Patient currently at CT scan .  

## 2017-07-27 NOTE — ED Notes (Signed)
Activated code stroke with [facility] per RN. Spoke with [facility].

## 2017-07-27 NOTE — ED Notes (Signed)
WIFE IN WAITING ROOM 

## 2017-07-27 NOTE — H&P (Signed)
[facility] - [place] [facility] [place] Admission History and Physical Tonye Royalty, D.O.  ---------------------------------------------------------------------------------------------------------------------   PATIENT NAME: [name] [name] MR#: [id number] DATE OF BIRTH: 09-17-1965 DATE OF ADMISSION: 07/27/2017 PRIMARY CARE PHYSICIAN: Alysia Penna, MD  REQUESTING/REFERRING PHYSICIAN: ED Dr. Jacqulyn Bath  CHIEF COMPLAINT: Chief Complaint  Patient presents with  . Code Stroke  Please note most of the history is obtained from the patient's emergency department chart, emergency department provider and the patient's caregiver who is at the bedside. Patient's deferred personal narrative to caregiver.    HISTORY OF PRESENT ILLNESS: Breton Berns is a 52 y.o. male with a known history of GERD, HTN, nephrolithiasis, tobacco dependence was in a usual state of health until this morning when he woke up at 7am with left sided weakness.  Was found by family who was unable to lift him into bed.  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  Thought to not be a good candidate for thrombectomy due to prolonged time from onset.   At baseline patient works for a [employer], functionally independent.  Admits to drinking a pint of whiskey daily.    Otherwise there has been no change in status. Patient has been taking medication as prescribed and there has been no recent change in medication or diet.  There has been no recent illness, travel or sick contacts.    PAST MEDICAL HISTORY: Past Medical History:  Diagnosis Date  . GERD (gastroesophageal reflux disease)   . Hypertension   . Nephrolithiasis   . Tobacco abuse       PAST SURGICAL HISTORY: Past Surgical History:  Procedure Laterality Date  . CYSTOSCOPY W/  URETERAL STENT PLACEMENT Bilateral 09/07/2013   Procedure: CYSTOSCOPY WITH RETROGRADE PYELOGRAM/URETERAL STENT PLACEMENT;  Surgeon: Sebastian Ache, MD;  Location: [facility];  Service: Urology;  Laterality: Bilateral;  . CYSTOSCOPY WITH RETROGRADE PYELOGRAM, URETEROSCOPY AND STENT PLACEMENT Bilateral 09/09/2013   Procedure: CYSTOSCOPY WITH RETROGRADE PYELOGRAM, DIAGNOSTIC URETEROSCOPY AND STENT CHANGE;  Surgeon: Sebastian Ache, MD;  Location: [facility];  Service: Urology;  Laterality: Bilateral;  . HOLMIUM LASER APPLICATION Right 09/07/2013   Procedure: HOLMIUM LASER APPLICATION;  Surgeon: Sebastian Ache, MD;  Location: [facility];  Service: Urology;  Laterality: Right;  . HOLMIUM LASER APPLICATION Right 09/09/2013   Procedure: HOLMIUM LASER APPLICATION;  Surgeon: Sebastian Ache, MD;  Location: [facility];  Service: Urology;  Laterality: Right;  . NEPHROLITHOTOMY Right 09/07/2013   Procedure: FIRST STAGE RIGHT PERCUTANEOUS NEPHROLITHOTOMY  WITH ACCESS, ;  Surgeon: Sebastian Ache, MD;  Location: [facility];  Service: Urology;  Laterality: Right;  . NEPHROLITHOTOMY Right 09/09/2013   Procedure: RIGHT 2ND STAGE NEPHROLITHOTOMY PERCUTANEOUS;  Surgeon: Sebastian Ache, MD;  Location: [facility];  Service: Urology;  Laterality: Right;  . UMBILICAL HERNIA REPAIR        SOCIAL HISTORY: Social History   Tobacco Use  . Smoking status: Current Every Day Smoker    Packs/day: 1.00    Years: 25.00    Pack years: 25.00    Types: Cigarettes  . Smokeless tobacco: Never Used  Substance Use Topics  . Alcohol use: Yes    Comment: 12 mixed drinks per week       FAMILY HISTORY: Family History  Problem Relation Age of Onset  . Prostate cancer Father 27  .  Lung cancer Father   . Nephrolithiasis Father   . Ovarian cancer Mother   . CVA Paternal Grandmother        in her 34's  . Cancer Paternal Grandmother        eye  . Diabetes Maternal Grandmother   . Alcoholism Maternal Uncle        x 3     MEDICATIONS AT HOME: Prior to  Admission medications   Medication Sig Start Date End Date Taking? Authorizing Provider  hydrocortisone (ANUSOL-HC) 25 MG suppository Place 1 suppository (25 mg total) rectally at bedtime. For 5 nights then as needed for hemorrhoids 08/24/13   Iva Boop, MD  HYDROmorphone (DILAUDID) 2 MG tablet Take 1 tablet (2 mg total) by mouth every 4 (four) hours as needed for moderate pain or severe pain. Post-operatively 09/10/13   Sebastian Ache, MD  ibuprofen (ADVIL,MOTRIN) 200 MG tablet Take 400 mg by mouth every 6 (six) hours as needed (Pain).    [provider]  senna-docusate (SENOKOT-S) 8.6-50 MG per tablet Take 1 tablet by mouth 2 (two) times daily. While taking pain meds to prevent constipation 09/10/13   Sebastian Ache, MD  sulfamethoxazole-trimethoprim (BACTRIM DS) 800-160 MG per tablet Take 1 tablet by mouth 2 (two) times daily. X 3 days. Begin day prior to next Urology appointment 09/10/13   Sebastian Ache, MD  tetrahydrozoline-zinc (VISINE-AC) 0.05-0.25 % ophthalmic solution Place 2 drops into both eyes as needed.    [provider]      DRUG ALLERGIES: No Known Allergies   REVIEW OF SYSTEMS: Unable to obtain  PHYSICAL EXAMINATION: VITAL SIGNS: Blood pressure (!) 120/92, pulse 93, temperature 100 F (37.8 C), temperature source Temporal, resp. rate (!) 25, height 5\' 5"  (1.651 m), weight 76.3 kg (168 lb 3.2 oz), SpO2 95 %.  GENERAL: 52 y.o.-year-old male patient, well-developed, well-nourished lying in the bed in no acute distress.  Pleasant and cooperative.  Answers questions, complies with exam. HEENT: Head atraumatic, normocephalic. Pupils equal, round, reactive to light and accommodation. No scleral icterus. Extraocular muscles intact. . Mucus membranes moist. NECK: Supple, full range of motion. No JVD, no bruit heard.  CHEST: Normal breath sounds bilaterally. No wheezing, rales, rhonchi or crackles. No use of accessory muscles of respiration.  CARDIOVASCULAR: S1,  S2 normal. No murmurs, rubs, or gallops. Cap refill <2 seconds. ABDOMEN: Soft, nontender, nondistended. No rebound, guarding, rigidity. Normoactive bowel sounds present in all four quadrants. No organomegaly or mass. EXTREMITIES: Full range of motion. No pedal edema, cyanosis, or clubbing. NEUROLOGIC: left facial drooping.  Asymmetric smile.  Muscle strength 2/5 in LUE and 1/5 in LLE, 5/5 in RUE/RLE extremities. Sensation intact.  PSYCHIATRIC: The patient is alert and oriented x 3.  SKIN: Warm, dry, and intact without obvious rash, lesion, or ulcer.  LABORATORY PANEL:  CBC Recent Labs  Lab 07/27/17 2025 07/27/17 2036  WBC 16.1*  --   HGB 18.0* 18.0*  HCT 52.2* 53.0*  PLT 254  --    ----------------------------------------------------------------------------------------------------------------- Chemistries Recent Labs  Lab 07/27/17 2025 07/27/17 2036  NA 137 141  K 3.7 3.7  CL 102 103  CO2 25  --   GLUCOSE 120* 118*  BUN 11 12  CREATININE 1.22 1.00  CALCIUM 9.6  --   AST 51*  --   ALT 37  --   ALKPHOS 71  --   BILITOT 1.1  --    ------------------------------------------------------------------------------------------------------------------ Cardiac Enzymes No results for input(s): TROPONINI in the last 168  hours. ------------------------------------------------------------------------------------------------------------------  RADIOLOGY: Ct Angio Head W Or Wo Contrast  Result Date: 07/27/2017 CLINICAL DATA:  Code stroke. Left-sided weakness. Left facial droop. EXAM: CT ANGIOGRAPHY HEAD AND NECK CT PERFUSION BRAIN TECHNIQUE: Multidetector CT imaging of the head and neck was performed using the standard protocol during bolus administration of intravenous contrast. Multiplanar CT image reconstructions and MIPs were obtained to evaluate the vascular anatomy. Carotid stenosis measurements (when applicable) are obtained utilizing NASCET criteria, using the distal internal  carotid diameter as the denominator. Multiphase CT imaging of the brain was performed following IV bolus contrast injection. Subsequent parametric perfusion maps were calculated using RAPID software. CONTRAST:  ISOVUE-370 IOPAMIDOL (ISOVUE-370) INJECTION 76% COMPARISON:  CT head without contrast from the same day. FINDINGS: CTA NECK FINDINGS Aortic arch: A 3 vessel arch configuration is present. Atherosclerotic calcifications are present at the origin of the left subclavian artery without a significant stenosis. There is no aneurysm. Right carotid system: Right common carotid artery is within normal limits. There is a high-grade, near occlusive stenosis at the bifurcation. There is marked decrease in caliber of the right cervical ICA with near complete loss of contrast at the skull base. Left carotid system: Left common carotid artery demonstrates mural calcifications anteriorly. There is calcified and noncalcified plaque in the proximal left internal carotid artery. The lumen is narrowed to 2.7 mm approximately 1 cm from the bifurcation. This compares with a more normal distal measurement 4.8 mm. No tandem stenosis is present. Vertebral arteries: The right vertebral artery is the dominant vessel. Both vertebral arteries originate from the subclavian arteries. There is a moderate stenosis of the proximal right vertebral artery relative to the more distal vessel. There is no other significant stenosis of the vertebral arteries in the neck. Skeleton: Vertebral body heights and alignment are maintained. No focal lytic or blastic lesions are present. Other neck: Soft tissues the neck are otherwise unremarkable. Thyroid is within normal limits. There is no significant adenopathy. Salivary glands are normal. Upper chest: Centrilobular emphysematous changes are present. No focal nodule or mass lesion is present. There is no airspace disease. Review of the MIP images confirms the above findings CTA HEAD FINDINGS  Anterior circulation: A posterior communicating artery is present. There is some reconstitution of the cavernous right internal carotid artery. The petrous segment is markedly narrowed. There is significant narrowing of the supraclinoid right ICA. The right M1 segment is occluded. The right A1 segment is not visualized. Atherosclerotic calcifications are present within the cavernous left internal carotid artery without a significant stenosis through the ICA terminus. The A1 and M1 segments are patent. Anterior communicating artery is patent. Both A2 segments fill from the left. The left MCA bifurcation is intact. Left MCA branch vessels are normal. There is no significant reconstitution of right MCA branch vessels. Very poor collaterals are present. Posterior circulation: The right vertebral artery is the dominant vessel. PICA origins are visualized and normal. The basilar artery is small. The left posterior cerebral artery originates from the basilar tip. The fetal type right posterior cerebral artery is present. Proximal scratched at there is mild narrowing of the left P2 segment prior to the bifurcation. There is moderate attenuation of PCA branch vessels bilaterally. Venous sinuses: The dural sinuses are patent. Straight sinus and deep cerebral veins are intact. Cortical veins are unremarkable. Anatomic variants: Fetal type right posterior cerebral artery. Review of the MIP images confirms the above findings CT Brain Perfusion Findings: CBF (<30%) Volume: 69mL Perfusion (Tmax>6.0s) volume:  Mismatch Volume: 64mL Infarction Location:Right MCA territory CBF < 30% confirms ASPECTS score of at least 4 and possibly 3. The hypoperfusion index is 0.6 consistent with very poor collaterals and a rapidly progressive infarct. IMPRESSION: 1. Large right MCA territory infarct.  CBF< 30% is 69 mL. 2. Hypoperfusion index of 0.6 consistent with very poor collaterals and rapidly progressive infarct. 3. 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 4. Marked decreased caliber of the cervical right internal carotid artery distal to the stenosis. 5. Reconstitution of the right internal carotid artery at the ophthalmic segment with some filling of the fetal type right posterior cerebral artery. 6. Occluded right M1 segment with poor collaterals. 7. Atherosclerotic changes at the left carotid bifurcation without significant stenosis. 8. Left MCA and ACA vessels are within normal limits. 9. Small basilar artery feeds the left posterior cerebral artery. The above was relayed via text pager to Dr. Nicholas Lose on 07/27/2017 at 21:08 . Electronically Signed   By: Marin Roberts M.D.   On: 07/27/2017 21:19   Ct Angio Neck W Or Wo Contrast  Result Date: 07/27/2017 CLINICAL DATA:  Code stroke. Left-sided weakness. Left facial droop. EXAM: CT ANGIOGRAPHY HEAD AND NECK CT PERFUSION BRAIN TECHNIQUE: Multidetector CT imaging of the head and neck was performed using the standard protocol during bolus administration of intravenous contrast. Multiplanar CT image reconstructions and MIPs were obtained to evaluate the vascular anatomy. Carotid stenosis measurements (when applicable) are obtained utilizing NASCET criteria, using the distal internal carotid diameter as the denominator. Multiphase CT imaging of the brain was performed following IV bolus contrast injection. Subsequent parametric perfusion maps were calculated using RAPID software. CONTRAST:  ISOVUE-370 IOPAMIDOL (ISOVUE-370) INJECTION 76% COMPARISON:  CT head without contrast from the same day. FINDINGS: CTA NECK FINDINGS Aortic arch: A 3 vessel arch configuration is present. Atherosclerotic calcifications are present at the origin of the left subclavian artery without a significant stenosis. There is no aneurysm. Right carotid system: Right common carotid artery is within normal limits. There is a high-grade, near  occlusive stenosis at the bifurcation. There is marked decrease in caliber of the right cervical ICA with near complete loss of contrast at the skull base. Left carotid system: Left common carotid artery demonstrates mural calcifications anteriorly. There is calcified and noncalcified plaque in the proximal left internal carotid artery. The lumen is narrowed to 2.7 mm approximately 1 cm from the bifurcation. This compares with a more normal distal measurement 4.8 mm. No tandem stenosis is present. Vertebral arteries: The right vertebral artery is the dominant vessel. Both vertebral arteries originate from the subclavian arteries. There is a moderate stenosis of the proximal right vertebral artery relative to the more distal vessel. There is no other significant stenosis of the vertebral arteries in the neck. Skeleton: Vertebral body heights and alignment are maintained. No focal lytic or blastic lesions are present. Other neck: Soft tissues the neck are otherwise unremarkable. Thyroid is within normal limits. There is no significant adenopathy. Salivary glands are normal. Upper chest: Centrilobular emphysematous changes are present. No focal nodule or mass lesion is present. There is no airspace disease. Review of the MIP images confirms the above findings CTA HEAD FINDINGS Anterior circulation: A posterior communicating artery is present. There is some reconstitution of the cavernous right internal carotid artery. The petrous segment is markedly narrowed. There is significant narrowing of the supraclinoid right ICA. The right M1 segment is occluded. The right  A1 segment is not visualized. Atherosclerotic calcifications are present within the cavernous left internal carotid artery without a significant stenosis through the ICA terminus. The A1 and M1 segments are patent. Anterior communicating artery is patent. Both A2 segments fill from the left. The left MCA bifurcation is intact. Left MCA branch vessels are  normal. There is no significant reconstitution of right MCA branch vessels. Very poor collaterals are present. Posterior circulation: The right vertebral artery is the dominant vessel. PICA origins are visualized and normal. The basilar artery is small. The left posterior cerebral artery originates from the basilar tip. The fetal type right posterior cerebral artery is present. Proximal scratched at there is mild narrowing of the left P2 segment prior to the bifurcation. There is moderate attenuation of PCA branch vessels bilaterally. Venous sinuses: The dural sinuses are patent. Straight sinus and deep cerebral veins are intact. Cortical veins are unremarkable. Anatomic variants: Fetal type right posterior cerebral artery. Review of the MIP images confirms the above findings CT Brain Perfusion Findings: CBF (<30%) Volume: 69mL Perfusion (Tmax>6.0s) volume: Mismatch Volume: 64mL Infarction Location:Right MCA territory CBF < 30% confirms ASPECTS score of at least 4 and possibly 3. The hypoperfusion index is 0.6 consistent with very poor collaterals and a rapidly progressive infarct. IMPRESSION: 1. Large right MCA territory infarct.  CBF< 30% is 69 mL. 2. Hypoperfusion index of 0.6 consistent with very poor collaterals and rapidly progressive infarct. 3. 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 4. Marked decreased caliber of the cervical right internal carotid artery distal to the stenosis. 5. Reconstitution of the right internal carotid artery at the ophthalmic segment with some filling of the fetal type right posterior cerebral artery. 6. Occluded right M1 segment with poor collaterals. 7. Atherosclerotic changes at the left carotid bifurcation without significant stenosis. 8. Left MCA and ACA vessels are within normal limits. 9. Small basilar artery feeds the left posterior cerebral artery. The above was relayed via text pager to Dr.  Nicholas Lose on 07/27/2017 at 21:08 . Electronically Signed   By: Marin Roberts M.D.   On: 07/27/2017 21:19   Ct Cerebral Perfusion W Contrast  Result Date: 07/27/2017 CLINICAL DATA:  Code stroke. Left-sided weakness. Left facial droop. EXAM: CT ANGIOGRAPHY HEAD AND NECK CT PERFUSION BRAIN TECHNIQUE: Multidetector CT imaging of the head and neck was performed using the standard protocol during bolus administration of intravenous contrast. Multiplanar CT image reconstructions and MIPs were obtained to evaluate the vascular anatomy. Carotid stenosis measurements (when applicable) are obtained utilizing NASCET criteria, using the distal internal carotid diameter as the denominator. Multiphase CT imaging of the brain was performed following IV bolus contrast injection. Subsequent parametric perfusion maps were calculated using RAPID software. CONTRAST:  ISOVUE-370 IOPAMIDOL (ISOVUE-370) INJECTION 76% COMPARISON:  CT head without contrast from the same day. FINDINGS: CTA NECK FINDINGS Aortic arch: A 3 vessel arch configuration is present. Atherosclerotic calcifications are present at the origin of the left subclavian artery without a significant stenosis. There is no aneurysm. Right carotid system: Right common carotid artery is within normal limits. There is a high-grade, near occlusive stenosis at the bifurcation. There is marked decrease in caliber of the right cervical ICA with near complete loss of contrast at the skull base. Left carotid system: Left common carotid artery demonstrates mural calcifications anteriorly. There is calcified and noncalcified plaque in the proximal left internal carotid artery. The lumen is narrowed to 2.7 mm approximately  1 cm from the bifurcation. This compares with a more normal distal measurement 4.8 mm. No tandem stenosis is present. Vertebral arteries: The right vertebral artery is the dominant vessel. Both vertebral arteries originate from the subclavian arteries. There  is a moderate stenosis of the proximal right vertebral artery relative to the more distal vessel. There is no other significant stenosis of the vertebral arteries in the neck. Skeleton: Vertebral body heights and alignment are maintained. No focal lytic or blastic lesions are present. Other neck: Soft tissues the neck are otherwise unremarkable. Thyroid is within normal limits. There is no significant adenopathy. Salivary glands are normal. Upper chest: Centrilobular emphysematous changes are present. No focal nodule or mass lesion is present. There is no airspace disease. Review of the MIP images confirms the above findings CTA HEAD FINDINGS Anterior circulation: A posterior communicating artery is present. There is some reconstitution of the cavernous right internal carotid artery. The petrous segment is markedly narrowed. There is significant narrowing of the supraclinoid right ICA. The right M1 segment is occluded. The right A1 segment is not visualized. Atherosclerotic calcifications are present within the cavernous left internal carotid artery without a significant stenosis through the ICA terminus. The A1 and M1 segments are patent. Anterior communicating artery is patent. Both A2 segments fill from the left. The left MCA bifurcation is intact. Left MCA branch vessels are normal. There is no significant reconstitution of right MCA branch vessels. Very poor collaterals are present. Posterior circulation: The right vertebral artery is the dominant vessel. PICA origins are visualized and normal. The basilar artery is small. The left posterior cerebral artery originates from the basilar tip. The fetal type right posterior cerebral artery is present. Proximal scratched at there is mild narrowing of the left P2 segment prior to the bifurcation. There is moderate attenuation of PCA branch vessels bilaterally. Venous sinuses: The dural sinuses are patent. Straight sinus and deep cerebral veins are intact. Cortical  veins are unremarkable. Anatomic variants: Fetal type right posterior cerebral artery. Review of the MIP images confirms the above findings CT Brain Perfusion Findings: CBF (<30%) Volume: 69mL Perfusion (Tmax>6.0s) volume: Mismatch Volume: 64mL Infarction Location:Right MCA territory CBF < 30% confirms ASPECTS score of at least 4 and possibly 3. The hypoperfusion index is 0.6 consistent with very poor collaterals and a rapidly progressive infarct. IMPRESSION: 1. Large right MCA territory infarct.  CBF< 30% is 69 mL. 2. Hypoperfusion index of 0.6 consistent with very poor collaterals and rapidly progressive infarct. 3. 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 4. Marked decreased caliber of the cervical right internal carotid artery distal to the stenosis. 5. Reconstitution of the right internal carotid artery at the ophthalmic segment with some filling of the fetal type right posterior cerebral artery. 6. Occluded right M1 segment with poor collaterals. 7. Atherosclerotic changes at the left carotid bifurcation without significant stenosis. 8. Left MCA and ACA vessels are within normal limits. 9. Small basilar artery feeds the left posterior cerebral artery. The above was relayed via text pager to Dr. Nicholas Lose on 07/27/2017 at 21:08 . Electronically Signed   By: Marin Roberts M.D.   On: 07/27/2017 21:19   Dg Foot 2 Views Left  Result Date: 07/27/2017 CLINICAL DATA:  Fall with foot pain EXAM: LEFT FOOT - 2 VIEW COMPARISON:  None. FINDINGS: There is no evidence of fracture or dislocation. There is no evidence of arthropathy or other focal bone abnormality. Soft tissues  are unremarkable. IMPRESSION: Negative. Electronically Signed   By: Deatra Robinson M.D.   On: 07/27/2017 22:56   Ct Head Code Stroke Wo Contrast  Result Date: 07/27/2017 CLINICAL DATA:  Code stroke. Acute onset of left-sided weakness and facial droop beginning at 7  a.m. today, 13.5 hours ago. EXAM: CT HEAD WITHOUT CONTRAST TECHNIQUE: Contiguous axial images were obtained from the base of the skull through the vertex without intravenous contrast. COMPARISON:  None. FINDINGS: Brain: There is diffuse loss of gray-white differentiation in the right temporal lobe. There is hypoattenuation along the right insular cortex and the right lentiform nucleus. Hypoattenuation involves the right caudate head and anterior limb internal capsule. Subcortical white matter hypoattenuation is present in the super ganglionic region without additional cortical lesions. There is partial effacement of the right lateral ventricle and sulci on the right. The brainstem and cerebellum are within normal limits. Vascular: Atherosclerotic changes are present within the cavernous internal carotid arteries bilaterally. A hyperdense right MCA. Hyperdense vessels are seen beyond bifurcation. Skull: Calvarium is intact. No focal lytic or blastic lesions are present. Sinuses/Orbits: Mild mucosal thickening is present in the left maxillary sinus. There is opacification of a single right ethmoid air cell. No fluid levels are present. Globes and orbits are within normal limits. ASPECTS [facility] [place] Stroke Program Early CT Score) - Ganglionic level infarction (caudate, lentiform nuclei, internal capsule, insula, M1-M3 cortex): 1/7 - Supraganglionic infarction (M4-M6 cortex): 3/3 Total score (0-10 with 10 being normal): 4/7 IMPRESSION: 1. Large right MCA territory infarct involving the insular cortex, lentiform nucleus, anterior limb internal capsule, caudate head, and right temporal lobe. This results in some mass effect with effacement of the sulci and right lateral ventricle but no hemorrhage. 2. Hyperdense right MCA extending beyond the bifurcation compatible with thrombus. 3. Mild diffuse subcortical white matter hypoattenuation bilaterally likely reflects microvascular ischemia. 4. ASPECTS is 4/10 The above was  relayed via text pager to Dr. Nicholas Lose on 07/27/2017 at 20:40 . Electronically Signed   By: Marin Roberts M.D.   On: 07/27/2017 20:42    EKG: Sinus tachy at 100bpm  IMPRESSION AND PLAN:  This is a 52 y.o. male with a history of GERD, HTN, nephrolithiasis, tobacco dependence now being admitted with:  1. R MCA ischemic infarct - Admit telemetry observation for neuro workup including: - Studies: MRA/MRI, Echo, Carotids - Labs: CBC, BMP, Lipids, TFTs, A1C - Nursing: Neurochecks, O2, dysphagia screen, permissive hypertension.  - Consults: Neurology, PT/OT, S/S consults.  - Meds: Daily aspirin  PR until passes swallow - Fluids: IVNS@75cc /hr.   - Routine DVT Px: with Lovenox, SCDs, early ambulation  2. Left foot pain - XR ordered by EDP.  All the records are reviewed and case discussed with ED provider. Management plans discussed with the patient and/or family who express understanding and agree with plan of care.    Akeylah Hendel D.O. on 07/27/2017 at 11:29 PM CC: Primary care physician; Alysia Penna, MD     Note: This dictation was prepared with Dragon dictation along with smaller phrase technology. Any transcriptional errors that result from this process are unintentional.

## 2017-07-28 ENCOUNTER — Encounter (HOSPITAL_COMMUNITY): Payer: Managed Care, Other (non HMO)

## 2017-07-28 ENCOUNTER — Inpatient Hospital Stay (HOSPITAL_COMMUNITY): Payer: Managed Care, Other (non HMO)

## 2017-07-28 ENCOUNTER — Other Ambulatory Visit (HOSPITAL_COMMUNITY): Payer: Managed Care, Other (non HMO)

## 2017-07-28 DIAGNOSIS — G9340 Encephalopathy, unspecified: Secondary | ICD-10-CM

## 2017-07-28 DIAGNOSIS — I10 Essential (primary) hypertension: Secondary | ICD-10-CM

## 2017-07-28 DIAGNOSIS — I6521 Occlusion and stenosis of right carotid artery: Secondary | ICD-10-CM

## 2017-07-28 DIAGNOSIS — I63131 Cerebral infarction due to embolism of right carotid artery: Principal | ICD-10-CM

## 2017-07-28 LAB — URINALYSIS, DIPSTICK ONLY
Bilirubin Urine: NEGATIVE
Glucose, UA: NEGATIVE mg/dL
Hgb urine dipstick: NEGATIVE
KETONES UR: NEGATIVE mg/dL
LEUKOCYTES UA: NEGATIVE
NITRITE: NEGATIVE
PH: 7 (ref 5.0–8.0)
Protein, ur: NEGATIVE mg/dL

## 2017-07-28 LAB — GLUCOSE, CAPILLARY: Glucose-Capillary: 117 mg/dL — ABNORMAL HIGH (ref 65–99)

## 2017-07-28 LAB — CBC
HCT: 52.1 % — ABNORMAL HIGH (ref 39.0–52.0)
Hemoglobin: 18.3 g/dL — ABNORMAL HIGH (ref 13.0–17.0)
MCH: 34.8 pg — ABNORMAL HIGH (ref 26.0–34.0)
MCHC: 35.1 g/dL (ref 30.0–36.0)
MCV: 99 fL (ref 78.0–100.0)
Platelets: 234 10*3/uL (ref 150–400)
RBC: 5.26 MIL/uL (ref 4.22–5.81)
RDW: 12.3 % (ref 11.5–15.5)
WBC: 14.1 10*3/uL — ABNORMAL HIGH (ref 4.0–10.5)

## 2017-07-28 LAB — CREATININE, SERUM
CREATININE: 1.12 mg/dL (ref 0.61–1.24)
GFR calc Af Amer: >60 mL/min (ref >60)

## 2017-07-28 LAB — SODIUM
SODIUM: 140 mmol/L (ref 135–145)
Sodium: 139 mmol/L (ref 135–145)

## 2017-07-28 LAB — LIPID PANEL
CHOLESTEROL: 172 mg/dL (ref 0–200)
HDL: 46 mg/dL (ref >40)
LDL Cholesterol: 106 mg/dL — ABNORMAL HIGH (ref 0–99)
Total CHOL/HDL Ratio: 3.7 RATIO
Triglycerides: 99 mg/dL (ref <150)
VLDL: 20 mg/dL (ref 0–40)

## 2017-07-28 LAB — TROPONIN I
TROPONIN I: 0.03 ng/mL — AB (ref <0.03)
TROPONIN I: 0.16 ng/mL — AB (ref <0.03)
Troponin I: 0.03 ng/mL (ref <0.03)
Troponin I: 0.03 ng/mL (ref <0.03)

## 2017-07-28 LAB — RAPID URINE DRUG SCREEN, HOSP PERFORMED
AMPHETAMINES: NOT DETECTED
BARBITURATES: NOT DETECTED
BENZODIAZEPINES: NOT DETECTED
COCAINE: POSITIVE — AB
Opiates: NOT DETECTED
TETRAHYDROCANNABINOL: NOT DETECTED

## 2017-07-28 LAB — HEMOGLOBIN A1C
HEMOGLOBIN A1C: 5 % (ref 4.8–5.6)
Mean Plasma Glucose: 96.8 mg/dL

## 2017-07-28 LAB — HIV ANTIBODY (ROUTINE TESTING W REFLEX): HIV Screen 4th Generation wRfx: NONREACTIVE

## 2017-07-28 LAB — MRSA PCR SCREENING: MRSA by PCR: NEGATIVE

## 2017-07-28 MED ORDER — ASPIRIN 325 MG PO TABS
325.0000 mg | ORAL_TABLET | Freq: Every day | ORAL | Status: DC
  Administered 2017-07-28 – 2017-08-02 (×6): 325 mg via ORAL
  Filled 2017-07-28 (×6): qty 1

## 2017-07-28 MED ORDER — ENOXAPARIN SODIUM 40 MG/0.4ML ~~LOC~~ SOLN
40.0000 mg | Freq: Every day | SUBCUTANEOUS | Status: DC
  Administered 2017-07-28 – 2017-08-05 (×9): 40 mg via SUBCUTANEOUS
  Filled 2017-07-28 (×9): qty 0.4

## 2017-07-28 MED ORDER — SENNOSIDES-DOCUSATE SODIUM 8.6-50 MG PO TABS
1.0000 | ORAL_TABLET | Freq: Every evening | ORAL | Status: DC | PRN
  Administered 2017-08-04: 1 via ORAL
  Filled 2017-07-28: qty 1

## 2017-07-28 MED ORDER — ACETAMINOPHEN 650 MG RE SUPP: 650.0000 mg | RECTAL | Status: DC | PRN

## 2017-07-28 MED ORDER — ACETAMINOPHEN 160 MG/5ML PO SOLN: 650.0000 mg | ORAL | Status: DC | PRN

## 2017-07-28 MED ORDER — ASPIRIN 300 MG RE SUPP: 300.0000 mg | Freq: Every day | RECTAL | Status: DC

## 2017-07-28 MED ORDER — SODIUM CHLORIDE 3 % IV SOLN: INTRAVENOUS | Status: DC

## 2017-07-28 MED ORDER — ACETAMINOPHEN 325 MG PO TABS
650.0000 mg | ORAL_TABLET | ORAL | Status: DC | PRN
  Administered 2017-07-28 – 2017-08-02 (×4): 650 mg via ORAL
  Filled 2017-07-28 (×4): qty 2

## 2017-07-28 MED ORDER — STROKE: EARLY STAGES OF RECOVERY BOOK
Freq: Once | Status: DC
  Filled 2017-07-28: qty 1

## 2017-07-28 MED ORDER — SODIUM CHLORIDE 3 % IV SOLN
INTRAVENOUS | Status: AC
  Administered 2017-07-28 – 2017-07-29 (×3): 75 mL/h via INTRAVENOUS
  Filled 2017-07-28 (×5): qty 500

## 2017-07-28 NOTE — Progress Notes (Signed)
  NICU nurse called me about elevated troponin level 0.16. Ordered stat ECG and further Troponin markers. Nurse reported patient was not having any chest pain. Went to examine patient; however, patient was off floor for stat head CT secondary to unequal pupil size per Dr Laurence Slate. Pt's ECG in room. Reviewed -> HR 86. No signs of ischemia. Computer interpretation "normal ECG" I would agree. Continue to cycle enzymes. Consider cardiology consult if enzymes continue to climb. Informed Dr Lucia Gaskins as well.  Delton See PA-C [facility] Pager [phone] 07/28/2017, 6:38 PM

## 2017-07-28 NOTE — Consult Note (Signed)
Reason for Consult: Right MCA infarction Referring Physician: Dr. Carolynne Edouard is an 52 y.o. male.  HPI: The patient is a 52 year old white male who was then admitted on 07/27/2017 with a right MCA infarct.  He has had a series of scans which show evolution of his right MCA infarction.  I spoke with the PA, Shanon Brow this afternoon around 12 noon.  I reviewed the patient's CT scan and told him at that point there was no indication for craniectomy as he was clinically stable.  Was called by Dr. Malen Gauze at [phone] and promptly answered his call.  He inquired about a prophylactic right decompressive craniectomy.  I told him that I would not do a prophylactic craniectomy, and would only do surgery if it was clinically indicated at this time.  I immediately came in and evaluated the patient.  Presently the patient is alert, pleasant, conversant and has a densely paretic left upper extremity.  He is able to give me his history.  Past Medical History:  Diagnosis Date  . GERD (gastroesophageal reflux disease)   . Hypertension   . Nephrolithiasis   . Tobacco abuse     Past Surgical History:  Procedure Laterality Date  . CYSTOSCOPY W/ URETERAL STENT PLACEMENT Bilateral 09/07/2013   Procedure: CYSTOSCOPY WITH RETROGRADE PYELOGRAM/URETERAL STENT PLACEMENT;  Surgeon: Alexis Frock, MD;  Location: [facility];  Service: Urology;  Laterality: Bilateral;  . CYSTOSCOPY WITH RETROGRADE PYELOGRAM, URETEROSCOPY AND STENT PLACEMENT Bilateral 09/09/2013   Procedure: CYSTOSCOPY WITH RETROGRADE PYELOGRAM, DIAGNOSTIC URETEROSCOPY AND STENT CHANGE;  Surgeon: Alexis Frock, MD;  Location: [facility];  Service: Urology;  Laterality: Bilateral;  . HOLMIUM LASER APPLICATION Right 08/09/6193   Procedure: HOLMIUM LASER APPLICATION;  Surgeon: Alexis Frock, MD;  Location: [facility];  Service: Urology;  Laterality: Right;  . HOLMIUM LASER APPLICATION Right 0/11/3265   Procedure: HOLMIUM LASER APPLICATION;  Surgeon: Alexis Frock, MD;   Location: [facility];  Service: Urology;  Laterality: Right;  . NEPHROLITHOTOMY Right 09/07/2013   Procedure: FIRST STAGE RIGHT PERCUTANEOUS NEPHROLITHOTOMY  WITH ACCESS, ;  Surgeon: Alexis Frock, MD;  Location: [facility];  Service: Urology;  Laterality: Right;  . NEPHROLITHOTOMY Right 09/09/2013   Procedure: RIGHT 2ND STAGE NEPHROLITHOTOMY PERCUTANEOUS;  Surgeon: Alexis Frock, MD;  Location: [facility];  Service: Urology;  Laterality: Right;  . UMBILICAL HERNIA REPAIR      Family History  Problem Relation Age of Onset  . Prostate cancer Father 64  . Lung cancer Father   . Nephrolithiasis Father   . Ovarian cancer Mother   . CVA Paternal Grandmother        in her 84's  . Cancer Paternal Grandmother        eye  . Diabetes Maternal Grandmother   . Alcoholism Maternal Uncle        x 3    Social History:  reports that he has been smoking cigarettes.  He has a 25.00 pack-year smoking history. He has never used smokeless tobacco. He reports that he drinks alcohol. He reports that he has current or past drug history. Drug: Marijuana.  Allergies: No Known Allergies  Medications:  I have reviewed the patient's current medications. Prior to Admission:  Medications Prior to Admission  Medication Sig Dispense Refill Last Dose  . lisinopril-hydrochlorothiazide (PRINZIDE,ZESTORETIC) 20-25 MG tablet Take 1 tablet by mouth daily.   07/27/2017 at Unknown time  . hydrocortisone (ANUSOL-HC) 25 MG suppository Place 1 suppository (25 mg total) rectally at bedtime. For 5 nights  then as needed for hemorrhoids (Patient not taking: Reported on 07/28/2017) 24 suppository 0 Not Taking at Unknown time  . HYDROmorphone (DILAUDID) 2 MG tablet Take 1 tablet (2 mg total) by mouth every 4 (four) hours as needed for moderate pain or severe pain. Post-operatively (Patient not taking: Reported on 07/28/2017) 30 tablet 0 Not Taking at Unknown time  . senna-docusate (SENOKOT-S) 8.6-50 MG per tablet Take 1 tablet by mouth 2 (two)  times daily. While taking pain meds to prevent constipation (Patient not taking: Reported on 07/28/2017) 30 tablet 0 Not Taking at Unknown time   Scheduled: .  stroke: mapping our early stages of recovery book   Does not apply Once  . aspirin  300 mg Rectal Daily   Or  . aspirin  325 mg Oral Daily  . enoxaparin (LOVENOX) injection  40 mg Subcutaneous Daily   Continuous: . sodium chloride (hypertonic) 75 mL/hr (07/28/17 1328)   GBT:[id number] **OR** acetaminophen (TYLENOL) oral liquid 160 mg/5 mL **OR** acetaminophen, senna-docusate Anti-infectives (From admission, onward)   None       Results for orders placed or performed during the hospital encounter of 07/27/17 (from the past 48 hour(s))  Protime-INR     Status: None   Collection Time: 07/27/17  8:25 PM  Result Value Ref Range   Prothrombin Time 13.6 11.4 - 15.2 seconds   INR 1.05     Comment: Performed at [facility], [address], [place], [place] [address]  APTT     Status: None   Collection Time: 07/27/17  8:25 PM  Result Value Ref Range   aPTT 26 24 - 36 seconds    Comment: Performed at [facility] [address] [place], [place] [address]  CBC     Status: Abnormal   Collection Time: 07/27/17  8:25 PM  Result Value Ref Range   WBC 16.1 (H) 4.0 - 10.5 K/uL   RBC 5.33 4.22 - 5.81 MIL/uL   Hemoglobin 18.0 (H) 13.0 - 17.0 g/dL   HCT 52.2 (H) 39.0 - 52.0 %   MCV 97.9 78.0 - 100.0 fL   MCH 33.8 26.0 - 34.0 pg   MCHC 34.5 30.0 - 36.0 g/dL   RDW 12.2 11.5 - 15.5 %   Platelets 254 150 - 400 K/uL    Comment: Performed at [facility] [address], [place], [place] [address]  Differential     Status: Abnormal   Collection Time: 07/27/17  8:25 PM  Result Value Ref Range   Neutrophils Relative % 75 %   Neutro Abs 11.9 (H) 1.7 - 7.7 K/uL   Lymphocytes Relative 16 %   Lymphs Abs 2.5 0.7 - 4.0 K/uL   Monocytes Relative 9 %   Monocytes Absolute 1.5 (H) 0.1 - 1.0 K/uL   Eosinophils Relative 0 %    Eosinophils Absolute 0.0 0.0 - 0.7 K/uL   Basophils Relative 0 %   Basophils Absolute 0.1 0.0 - 0.1 K/uL   Immature Granulocytes 0 %   Abs Immature Granulocytes 0.1 0.0 - 0.1 K/uL    Comment: Performed at [facility] [address] [place], [place] [address]  Comprehensive metabolic panel     Status: Abnormal   Collection Time: 07/27/17  8:25 PM  Result Value Ref Range   Sodium 137 135 - 145 mmol/L   Potassium 3.7 3.5 - 5.1 mmol/L   Chloride 102 101 - 111 mmol/L   CO2 25 22 -  32 mmol/L   Glucose, Bld 120 (H) 65 - 99 mg/dL   BUN 11 6 - 20 mg/dL   Creatinine, Ser 1.22 0.61 - 1.24 mg/dL   Calcium 9.6 8.9 - 10.3 mg/dL   Total Protein 7.5 6.5 - 8.1 g/dL   Albumin 4.1 3.5 - 5.0 g/dL   AST 51 (H) 15 - 41 U/L   ALT 37 17 - 63 U/L   Alkaline Phosphatase 71 38 - 126 U/L   Total Bilirubin 1.1 0.3 - 1.2 mg/dL   GFR calc non Af Amer >60 >60 mL/min   GFR calc Af Amer >60 >60 mL/min    Comment: (NOTE) The eGFR has been calculated using the CKD EPI equation. This calculation has not been validated in all clinical situations. eGFR's persistently <60 mL/min signify possible Chronic Kidney Disease.    Anion gap 10 5 - 15    Comment: Performed at [facility] [address], [place], [place] [address]  I-Stat Chem 8, ED     Status: Abnormal   Collection Time: 07/27/17  8:36 PM  Result Value Ref Range   Sodium 141 135 - 145 mmol/L   Potassium 3.7 3.5 - 5.1 mmol/L   Chloride 103 101 - 111 mmol/L   BUN 12 6 - 20 mg/dL   Creatinine, Ser 1.00 0.61 - 1.24 mg/dL   Glucose, Bld 118 (H) 65 - 99 mg/dL   Calcium, Ion 1.12 (L) 1.15 - 1.40 mmol/L   TCO2 25 22 - 32 mmol/L   Hemoglobin 18.0 (H) 13.0 - 17.0 g/dL   HCT 53.0 (H) 39.0 - 52.0 %  I-stat troponin, ED     Status: None   Collection Time: 07/27/17  8:38 PM  Result Value Ref Range   Troponin i, poc 0.02 0.00 - 0.08 ng/mL   Comment 3            Comment: Due to the release kinetics of cTnI, a negative result within the first  hours of the onset of symptoms does not rule out myocardial infarction with certainty. If myocardial infarction is still suspected, repeat the test at appropriate intervals.   CBG monitoring, ED     Status: None   Collection Time: 07/27/17  9:05 PM  Result Value Ref Range   Glucose-Capillary 94 65 - 99 mg/dL   Comment 1 Notify RN   Urinalysis, dipstick only     Status: Abnormal   Collection Time: 07/28/17  2:43 AM  Result Value Ref Range   Color, Urine YELLOW YELLOW   APPearance CLEAR CLEAR   Specific Gravity, Urine >1.046 (H) 1.005 - 1.030   pH 7.0 5.0 - 8.0   Glucose, UA NEGATIVE NEGATIVE mg/dL   Hgb urine dipstick NEGATIVE NEGATIVE   Bilirubin Urine NEGATIVE NEGATIVE   Ketones, ur NEGATIVE NEGATIVE mg/dL   Protein, ur NEGATIVE NEGATIVE mg/dL   Nitrite NEGATIVE NEGATIVE   Leukocytes, UA NEGATIVE NEGATIVE    Comment: Performed at [facility] [address] [place] [address]  CBC     Status: Abnormal   Collection Time: 07/28/17  2:54 AM  Result Value Ref Range   WBC 14.1 (H) 4.0 - 10.5 K/uL   RBC 5.26 4.22 - 5.81 MIL/uL   Hemoglobin 18.3 (H) 13.0 - 17.0 g/dL   HCT 52.1 (H) 39.0 - 52.0 %   MCV 99.0 78.0 - 100.0 fL   MCH 34.8 (H) 26.0 - 34.0 pg   MCHC 35.1 30.0 - 36.0  g/dL   RDW 12.3 11.5 - 15.5 %   Platelets 234 150 - 400 K/uL    Comment: Performed at [facility], [place] [address], [place], [place] [address]  Creatinine, serum     Status: None   Collection Time: 07/28/17  2:54 AM  Result Value Ref Range   Creatinine, Ser 1.12 0.61 - 1.24 mg/dL   GFR calc non Af Amer >60 >60 mL/min   GFR calc Af Amer >60 >60 mL/min    Comment: (NOTE) The eGFR has been calculated using the CKD EPI equation. This calculation has not been validated in all clinical situations. eGFR's persistently <60 mL/min signify possible Chronic Kidney Disease. Performed at [facility], [place] [address], [place], [place] [address]   HIV antibody (Routine Testing)      Status: None   Collection Time: 07/28/17  2:54 AM  Result Value Ref Range   HIV Screen 4th Generation wRfx Non Reactive Non Reactive    Comment: (NOTE) Performed At: [facility] [address] [place], [place] [id number] Rush Farmer MD [id number] Performed at [facility], [place] [address], [place], [place] [address]   Troponin I     Status: Abnormal   Collection Time: 07/28/17  2:54 AM  Result Value Ref Range   Troponin I 0.03 (HH) <0.03 ng/mL    Comment: CRITICAL RESULT CALLED TO, READ BACK BY AND VERIFIED WITH: B.SANGALANG,RN 0420 07/28/17 M.CAMPBELL Performed at [facility], [place] [address], [place], [place] [address]   Troponin I     Status: Abnormal   Collection Time: 07/28/17  8:48 AM  Result Value Ref Range   Troponin I 0.03 (HH) <0.03 ng/mL    Comment: CRITICAL VALUE NOTED.  VALUE IS CONSISTENT WITH PREVIOUSLY REPORTED AND CALLED VALUE. Performed at [facility], [place] [address] [place], [place] [address]   Hemoglobin A1c     Status: None   Collection Time: 07/28/17  8:48 AM  Result Value Ref Range   Hgb A1c MFr Bld 5.0 4.8 - 5.6 %    Comment: (NOTE) Pre diabetes:          5.7%-6.4% Diabetes:              >6.4% Glycemic control for   <7.0% adults with diabetes    Mean Plasma Glucose 96.8 mg/dL    Comment: Performed at [facility] [address], [place], [place] [address]  Lipid panel     Status: Abnormal   Collection Time: 07/28/17  8:48 AM  Result Value Ref Range   Cholesterol 172 0 - 200 mg/dL   Triglycerides 99 <150 mg/dL   HDL 46 >40 mg/dL   Total CHOL/HDL Ratio 3.7 RATIO   VLDL 20 0 - 40 mg/dL   LDL Cholesterol 106 (H) 0 - 99 mg/dL    Comment:        Total Cholesterol/HDL:CHD Risk Coronary Heart Disease Risk Table                     Men   Women  1/2 Average Risk   3.4   3.3  Average Risk       5.0   4.4  2 X Average Risk   9.6   7.1  3 X Average Risk  23.4   11.0        Use the calculated Patient  Ratio above and the CHD Risk Table to determine the  patient's CHD Risk.        ATP III CLASSIFICATION (LDL):  <100     mg/dL   Optimal  100-129  mg/dL   Near or Above                    Optimal  130-159  mg/dL   Borderline  160-189  mg/dL   High  >190     mg/dL   Very High Performed at [facility] [address] [place], [place] [address]   Glucose, capillary     Status: Abnormal   Collection Time: 07/28/17  9:53 AM  Result Value Ref Range   Glucose-Capillary 117 (H) 65 - 99 mg/dL   Comment 1 Notify RN    Comment 2 Document in Chart   MRSA PCR Screening     Status: None   Collection Time: 07/28/17 11:49 AM  Result Value Ref Range   MRSA by PCR NEGATIVE NEGATIVE    Comment:        The GeneXpert MRSA Assay (FDA approved for NASAL specimens only), is one component of a comprehensive MRSA colonization surveillance program. It is not intended to diagnose MRSA infection nor to guide or monitor treatment for MRSA infections. Performed at [facility], [place] [address] [place], [place] [address]   Sodium     Status: None   Collection Time: 07/28/17 12:15 PM  Result Value Ref Range   Sodium 139 135 - 145 mmol/L    Comment: Performed at [facility] [address], [place], [place] [address]  Troponin I     Status: Abnormal   Collection Time: 07/28/17  2:01 PM  Result Value Ref Range   Troponin I 0.16 (HH) <0.03 ng/mL    Comment: DELTA CHECK NOTED CRITICAL RESULT CALLED TO, READ BACK BY AND VERIFIED WITH: RN [facility] AT 1623 [id number] MARTINB Performed at [facility], [place] [address], [place], [place] [address]     Ct Angio Head W Or Wo Contrast  Result Date: 07/27/2017 CLINICAL DATA:  Code stroke. Left-sided weakness. Left facial droop. EXAM: CT ANGIOGRAPHY HEAD AND NECK CT PERFUSION BRAIN TECHNIQUE: Multidetector CT imaging of the head and neck was performed using the standard protocol during bolus administration of intravenous contrast.  Multiplanar CT image reconstructions and MIPs were obtained to evaluate the vascular anatomy. Carotid stenosis measurements (when applicable) are obtained utilizing NASCET criteria, using the distal internal carotid diameter as the denominator. Multiphase CT imaging of the brain was performed following IV bolus contrast injection. Subsequent parametric perfusion maps were calculated using RAPID software. CONTRAST:  151m ISOVUE-370 IOPAMIDOL (ISOVUE-370) INJECTION 76% COMPARISON:  CT head without contrast from the same day. FINDINGS: CTA NECK FINDINGS Aortic arch: A 3 vessel arch configuration is present. Atherosclerotic calcifications are present at the origin of the left subclavian artery without a significant stenosis. There is no aneurysm. Right carotid system: Right common carotid artery is within normal limits. There is a high-grade, near occlusive stenosis at the bifurcation. There is marked decrease in caliber of the right cervical ICA with near complete loss of contrast at the skull base. Left carotid system: Left common carotid artery demonstrates mural calcifications anteriorly. There is calcified and noncalcified plaque in the proximal left internal carotid artery. The lumen is narrowed to 2.7 mm approximately 1 cm from the bifurcation. This compares with a more normal distal measurement 4.8 mm. No tandem stenosis is present. Vertebral arteries: The right  vertebral artery is the dominant vessel. Both vertebral arteries originate from the subclavian arteries. There is a moderate stenosis of the proximal right vertebral artery relative to the more distal vessel. There is no other significant stenosis of the vertebral arteries in the neck. Skeleton: Vertebral body heights and alignment are maintained. No focal lytic or blastic lesions are present. Other neck: Soft tissues the neck are otherwise unremarkable. Thyroid is within normal limits. There is no significant adenopathy. Salivary glands are normal.  Upper chest: Centrilobular emphysematous changes are present. No focal nodule or mass lesion is present. There is no airspace disease. Review of the MIP images confirms the above findings CTA HEAD FINDINGS Anterior circulation: A posterior communicating artery is present. There is some reconstitution of the cavernous right internal carotid artery. The petrous segment is markedly narrowed. There is significant narrowing of the supraclinoid right ICA. The right M1 segment is occluded. The right A1 segment is not visualized. Atherosclerotic calcifications are present within the cavernous left internal carotid artery without a significant stenosis through the ICA terminus. The A1 and M1 segments are patent. Anterior communicating artery is patent. Both A2 segments fill from the left. The left MCA bifurcation is intact. Left MCA branch vessels are normal. There is no significant reconstitution of right MCA branch vessels. Very poor collaterals are present. Posterior circulation: The right vertebral artery is the dominant vessel. PICA origins are visualized and normal. The basilar artery is small. The left posterior cerebral artery originates from the basilar tip. The fetal type right posterior cerebral artery is present. Proximal scratched at there is mild narrowing of the left P2 segment prior to the bifurcation. There is moderate attenuation of PCA branch vessels bilaterally. Venous sinuses: The dural sinuses are patent. Straight sinus and deep cerebral veins are intact. Cortical veins are unremarkable. Anatomic variants: Fetal type right posterior cerebral artery. Review of the MIP images confirms the above findings CT Brain Perfusion Findings: CBF (<30%) Volume: 54m Perfusion (Tmax>6.0s) volume: 1351mMismatch Volume: 6438mnfarction Location:Right MCA territory CBF < 30% confirms ASPECTS score of at least 4 and possibly 3. The hypoperfusion index is 0.6 consistent with very poor collaterals and a rapidly  progressive infarct. IMPRESSION: 1. Large right MCA territory infarct.  CBF< 30% is 69 mL. 2. Hypoperfusion index of 0.6 consistent with very poor collaterals and rapidly progressive infarct. 3. 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 4. Marked decreased caliber of the cervical right internal carotid artery distal to the stenosis. 5. Reconstitution of the right internal carotid artery at the ophthalmic segment with some filling of the fetal type right posterior cerebral artery. 6. Occluded right M1 segment with poor collaterals. 7. Atherosclerotic changes at the left carotid bifurcation without significant stenosis. 8. Left MCA and ACA vessels are within normal limits. 9. Small basilar artery feeds the left posterior cerebral artery. The above was relayed via text pager to Dr. EshOrlena Sheldon 07/27/2017 at 21:08 . Electronically Signed   By: ChrSan MorelleD.   On: 07/27/2017 21:19   Dg Chest 2 View  Result Date: 07/28/2017 CLINICAL DATA:  TIA.  Patient is unable to move arm. EXAM: CHEST - 2 VIEW COMPARISON:  None. FINDINGS: Slightly shallow inspiration. Normal heart size and pulmonary vascularity. No focal airspace disease or consolidation in the lungs. No blunting of costophrenic angles. No pneumothorax. Mediastinal contours appear intact. Degenerative changes in the spine. IMPRESSION: No active cardiopulmonary disease. Electronically Signed  By: Lucienne Capers M.D.   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 Awakens to voice and follows commands, but somnolent. L sided weakness. EXAM: CT HEAD WITHOUT CONTRAST TECHNIQUE: Contiguous axial images were obtained from the base of the skull through the vertex without intravenous contrast. COMPARISON:   07/28/2017 at 10:22 a.m. and earlier exams. FINDINGS: Brain: Hypotension reflecting recent infarction extends throughout much of the right middle cerebral artery territory, including right basal ganglia. The right middle cerebral artery is hyperdense consistent thrombus/embolus. Mass effect from the recent infarct partly effaces the right lateral ventricle and overlying sulci. No significant midline shift. The extent of the right middle cerebral artery distribution infarct is unchanged from the exam performed earlier today. There are no new areas infarction. No intracranial hemorrhage. Vascular: Hyperdense right middle cerebral artery as noted on the prior exam. Skull: Normal. Negative for fracture or focal lesion. Sinuses/Orbits: No acute finding. Other: None. IMPRESSION: 1. No significant interval change from the most recent prior exam obtained earlier today. 2. Evolving, large right MCA distribution infarct.  No hemorrhage. Electronically Signed   By: Lajean Manes M.D.   On: 07/28/2017 18:27   Ct Head Wo Contrast  Result Date: 07/28/2017 CLINICAL DATA:  Stroke, follow-up. EXAM: CT HEAD WITHOUT CONTRAST TECHNIQUE: Contiguous axial images were obtained from the base of the skull through the vertex without intravenous contrast. COMPARISON:  CT head without contrast and CT perfusion 07/27/2017. FINDINGS: Brain: There is expected evolution of the large right MCA territory infarct. Diffuse hypoattenuation is present throughout the anterior right temporal lobe. There is diffuse hypoattenuation involving the right insular cortex and basal ganglia. There is involvement of the insular scratched at there is involvement of the right internal capsule. Super ganglionic cortex is preserved. There is further effacement of the right lateral ventricle without downward herniation or significant midline shift. The brainstem and cerebellum are within normal limits. Vascular: Hyperdense right MCA is again noted. Atherosclerotic  calcifications are present. Skull: Calvarium is intact. No focal lytic or blastic lesions are present. Sinuses/Orbits: A right ethmoid air cells opacified. The remaining paranasal sinuses and the mastoid air cells are clear. Globes and orbits are within normal limits. IMPRESSION: 1. Expected evolution of large right MCA territory infarct without interval hemorrhage. 2. Progressive mass effect with effacement of the sulci and right lateral ventricle without significant midline shift or downward herniation. Electronically Signed   By: San Morelle M.D.   On: 07/28/2017 11:24   Ct Angio Neck W Or Wo Contrast  Result Date: 07/27/2017 CLINICAL DATA:  Code stroke. Left-sided weakness. Left facial droop. EXAM: CT ANGIOGRAPHY HEAD AND NECK CT PERFUSION BRAIN TECHNIQUE: Multidetector CT imaging of the head and neck was performed using the standard protocol during bolus administration of intravenous contrast. Multiplanar CT image reconstructions and MIPs were obtained to evaluate the vascular anatomy. Carotid stenosis measurements (when applicable) are obtained utilizing NASCET criteria, using the distal internal carotid diameter as the denominator. Multiphase CT imaging of the brain was performed following IV bolus contrast injection. Subsequent parametric perfusion maps were calculated using RAPID software. CONTRAST:  146m ISOVUE-370 IOPAMIDOL (ISOVUE-370) INJECTION 76% COMPARISON:  CT head without contrast from the same day. FINDINGS: CTA NECK FINDINGS Aortic arch: A 3 vessel arch configuration is present. Atherosclerotic calcifications are present at the origin of the  left subclavian artery without a significant stenosis. There is no aneurysm. Right carotid system: Right common carotid artery is within normal limits. There is a high-grade, near occlusive stenosis at the bifurcation. There is marked decrease in caliber of the right cervical ICA with near complete loss of contrast at the skull base. Left  carotid system: Left common carotid artery demonstrates mural calcifications anteriorly. There is calcified and noncalcified plaque in the proximal left internal carotid artery. The lumen is narrowed to 2.7 mm approximately 1 cm from the bifurcation. This compares with a more normal distal measurement 4.8 mm. No tandem stenosis is present. Vertebral arteries: The right vertebral artery is the dominant vessel. Both vertebral arteries originate from the subclavian arteries. There is a moderate stenosis of the proximal right vertebral artery relative to the more distal vessel. There is no other significant stenosis of the vertebral arteries in the neck. Skeleton: Vertebral body heights and alignment are maintained. No focal lytic or blastic lesions are present. Other neck: Soft tissues the neck are otherwise unremarkable. Thyroid is within normal limits. There is no significant adenopathy. Salivary glands are normal. Upper chest: Centrilobular emphysematous changes are present. No focal nodule or mass lesion is present. There is no airspace disease. Review of the MIP images confirms the above findings CTA HEAD FINDINGS Anterior circulation: A posterior communicating artery is present. There is some reconstitution of the cavernous right internal carotid artery. The petrous segment is markedly narrowed. There is significant narrowing of the supraclinoid right ICA. The right M1 segment is occluded. The right A1 segment is not visualized. Atherosclerotic calcifications are present within the cavernous left internal carotid artery without a significant stenosis through the ICA terminus. The A1 and M1 segments are patent. Anterior communicating artery is patent. Both A2 segments fill from the left. The left MCA bifurcation is intact. Left MCA branch vessels are normal. There is no significant reconstitution of right MCA branch vessels. Very poor collaterals are present. Posterior circulation: The right vertebral artery is  the dominant vessel. PICA origins are visualized and normal. The basilar artery is small. The left posterior cerebral artery originates from the basilar tip. The fetal type right posterior cerebral artery is present. Proximal scratched at there is mild narrowing of the left P2 segment prior to the bifurcation. There is moderate attenuation of PCA branch vessels bilaterally. Venous sinuses: The dural sinuses are patent. Straight sinus and deep cerebral veins are intact. Cortical veins are unremarkable. Anatomic variants: Fetal type right posterior cerebral artery. Review of the MIP images confirms the above findings CT Brain Perfusion Findings: CBF (<30%) Volume: 69m Perfusion (Tmax>6.0s) volume: 1324mMismatch Volume: 6468mnfarction Location:Right MCA territory CBF < 30% confirms ASPECTS score of at least 4 and possibly 3. The hypoperfusion index is 0.6 consistent with very poor collaterals and a rapidly progressive infarct. IMPRESSION: 1. Large right MCA territory infarct.  CBF< 30% is 69 mL. 2. Hypoperfusion index of 0.6 consistent with very poor collaterals and rapidly progressive infarct. 3. 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 4. Marked decreased caliber of the cervical right internal carotid artery distal to the stenosis. 5. Reconstitution of the right internal carotid artery at the ophthalmic segment with some filling of the fetal type right posterior cerebral artery. 6. Occluded right M1 segment with poor collaterals. 7. Atherosclerotic changes at the left carotid bifurcation without significant stenosis. 8. Left MCA and ACA vessels are within normal limits. 9. Small basilar  artery feeds the left posterior cerebral artery. The above was relayed via text pager to Dr. Orlena Sheldon on 07/27/2017 at 21:08 . Electronically Signed   By: San Morelle M.D.   On: 07/27/2017 21:19   Ct Cerebral Perfusion W Contrast  Result Date:  07/27/2017 CLINICAL DATA:  Code stroke. Left-sided weakness. Left facial droop. EXAM: CT ANGIOGRAPHY HEAD AND NECK CT PERFUSION BRAIN TECHNIQUE: Multidetector CT imaging of the head and neck was performed using the standard protocol during bolus administration of intravenous contrast. Multiplanar CT image reconstructions and MIPs were obtained to evaluate the vascular anatomy. Carotid stenosis measurements (when applicable) are obtained utilizing NASCET criteria, using the distal internal carotid diameter as the denominator. Multiphase CT imaging of the brain was performed following IV bolus contrast injection. Subsequent parametric perfusion maps were calculated using RAPID software. CONTRAST:  172m ISOVUE-370 IOPAMIDOL (ISOVUE-370) INJECTION 76% COMPARISON:  CT head without contrast from the same day. FINDINGS: CTA NECK FINDINGS Aortic arch: A 3 vessel arch configuration is present. Atherosclerotic calcifications are present at the origin of the left subclavian artery without a significant stenosis. There is no aneurysm. Right carotid system: Right common carotid artery is within normal limits. There is a high-grade, near occlusive stenosis at the bifurcation. There is marked decrease in caliber of the right cervical ICA with near complete loss of contrast at the skull base. Left carotid system: Left common carotid artery demonstrates mural calcifications anteriorly. There is calcified and noncalcified plaque in the proximal left internal carotid artery. The lumen is narrowed to 2.7 mm approximately 1 cm from the bifurcation. This compares with a more normal distal measurement 4.8 mm. No tandem stenosis is present. Vertebral arteries: The right vertebral artery is the dominant vessel. Both vertebral arteries originate from the subclavian arteries. There is a moderate stenosis of the proximal right vertebral artery relative to the more distal vessel. There is no other significant stenosis of the vertebral arteries  in the neck. Skeleton: Vertebral body heights and alignment are maintained. No focal lytic or blastic lesions are present. Other neck: Soft tissues the neck are otherwise unremarkable. Thyroid is within normal limits. There is no significant adenopathy. Salivary glands are normal. Upper chest: Centrilobular emphysematous changes are present. No focal nodule or mass lesion is present. There is no airspace disease. Review of the MIP images confirms the above findings CTA HEAD FINDINGS Anterior circulation: A posterior communicating artery is present. There is some reconstitution of the cavernous right internal carotid artery. The petrous segment is markedly narrowed. There is significant narrowing of the supraclinoid right ICA. The right M1 segment is occluded. The right A1 segment is not visualized. Atherosclerotic calcifications are present within the cavernous left internal carotid artery without a significant stenosis through the ICA terminus. The A1 and M1 segments are patent. Anterior communicating artery is patent. Both A2 segments fill from the left. The left MCA bifurcation is intact. Left MCA branch vessels are normal. There is no significant reconstitution of right MCA branch vessels. Very poor collaterals are present. Posterior circulation: The right vertebral artery is the dominant vessel. PICA origins are visualized and normal. The basilar artery is small. The left posterior cerebral artery originates from the basilar tip. The fetal type right posterior cerebral artery is present. Proximal scratched at there is mild narrowing of the left P2 segment prior to the bifurcation. There is moderate attenuation of PCA branch vessels bilaterally. Venous sinuses: The dural sinuses are patent. Straight sinus and deep cerebral veins are intact. Cortical  veins are unremarkable. Anatomic variants: Fetal type right posterior cerebral artery. Review of the MIP images confirms the above findings CT Brain Perfusion  Findings: CBF (<30%) Volume: 16m Perfusion (Tmax>6.0s) volume: 133mMismatch Volume: 6425mnfarction Location:Right MCA territory CBF < 30% confirms ASPECTS score of at least 4 and possibly 3. The hypoperfusion index is 0.6 consistent with very poor collaterals and a rapidly progressive infarct. IMPRESSION: 1. Large right MCA territory infarct.  CBF< 30% is 69 mL. 2. Hypoperfusion index of 0.6 consistent with very poor collaterals and rapidly progressive infarct. 3. 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 4. Marked decreased caliber of the cervical right internal carotid artery distal to the stenosis. 5. Reconstitution of the right internal carotid artery at the ophthalmic segment with some filling of the fetal type right posterior cerebral artery. 6. Occluded right M1 segment with poor collaterals. 7. Atherosclerotic changes at the left carotid bifurcation without significant stenosis. 8. Left MCA and ACA vessels are within normal limits. 9. Small basilar artery feeds the left posterior cerebral artery. The above was relayed via text pager to Dr. EshOrlena Sheldon 07/27/2017 at 21:08 . Electronically Signed   By: ChrSan MorelleD.   On: 07/27/2017 21:19   Dg Foot 2 Views Left  Result Date: 07/27/2017 CLINICAL DATA:  Fall with foot pain EXAM: LEFT FOOT - 2 VIEW COMPARISON:  None. FINDINGS: There is no evidence of fracture or dislocation. There is no evidence of arthropathy or other focal bone abnormality. Soft tissues are unremarkable. IMPRESSION: Negative. Electronically Signed   By: KevUlyses JarredD.   On: 07/27/2017 22:56   Ct Head Code Stroke Wo Contrast  Result Date: 07/27/2017 CLINICAL DATA:  Code stroke. Acute onset of left-sided weakness and facial droop beginning at 7 a.m. today, 13.5 hours ago. EXAM: CT HEAD WITHOUT CONTRAST TECHNIQUE: Contiguous axial images were obtained from the base of the skull through the vertex  without intravenous contrast. COMPARISON:  None. FINDINGS: Brain: There is diffuse loss of gray-white differentiation in the right temporal lobe. There is hypoattenuation along the right insular cortex and the right lentiform nucleus. Hypoattenuation involves the right caudate head and anterior limb internal capsule. Subcortical white matter hypoattenuation is present in the super ganglionic region without additional cortical lesions. There is partial effacement of the right lateral ventricle and sulci on the right. The brainstem and cerebellum are within normal limits. Vascular: Atherosclerotic changes are present within the cavernous internal carotid arteries bilaterally. A hyperdense right MCA. Hyperdense vessels are seen beyond bifurcation. Skull: Calvarium is intact. No focal lytic or blastic lesions are present. Sinuses/Orbits: Mild mucosal thickening is present in the left maxillary sinus. There is opacification of a single right ethmoid air cell. No fluid levels are present. Globes and orbits are within normal limits. ASPECTS ([facility] Early CT Score) - Ganglionic level infarction (caudate, lentiform nuclei, internal capsule, insula, M1-M3 cortex): 1/7 - Supraganglionic infarction (M4-M6 cortex): 3/3 Total score (0-10 with 10 being normal): 4/7 IMPRESSION: 1. Large right MCA territory infarct involving the insular cortex, lentiform nucleus, anterior limb internal capsule, caudate head, and right temporal lobe. This results in some mass effect with effacement of the sulci and right lateral ventricle but no hemorrhage. 2. Hyperdense right MCA extending beyond the bifurcation compatible with thrombus. 3. Mild diffuse subcortical white matter hypoattenuation bilaterally likely reflects microvascular ischemia. 4. ASPECTS is 4/10 The above was relayed via text pager to Dr. ESHOrlena Sheldon  07/27/2017 at 20:40 . Electronically Signed   By: San Morelle M.D.   On: 07/27/2017 20:42    ROS: As  above, the patient complains of a mild headache. Blood pressure (!) 130/96, pulse 77, temperature 98.3 F (36.8 C), temperature source Oral, resp. rate 20, height '5\' 5"'  (1.651 m), weight 76.3 kg (168 lb 3.2 oz), SpO2 97 %. Estimated body mass index is 27.99 kg/m as calculated from the following:   Height as of this encounter: '5\' 5"'  (1.651 m).   Weight as of this encounter: 76.3 kg (168 lb 3.2 oz).  Physical Exam  General: An alert and pleasant 52 year old white male in no apparent distress  HEENT: The patient's pupils are equal round reactive to light, extraocular muscles are intact, his tongue protrudes a bit to the left.  Neck: Supple with a normal range of motion without deformities.  Thorax: Symmetric  Abdomen: Soft  Extremities: Unremarkable  Neurologic exam the patient is alert and oriented x4, Glasgow Coma Scale 15.  Cranial nerve exam demonstrates a left facial droop and as above his tongue protrudes a bit to the left.  The patient's speech is normal.  His strength is normal in his right extremities.  He is densely paretic in his left upper extremities.  His strength in his left lower extremity is fairly normal.  Cerebellar function is intact to rapid alternating movements of the upper extremities on the right.  Sensory function is intact light touch sensation all tested dermatomes bilaterally.  I have reviewed the patient's most recent head CT performed at 1813 tonight.  He has an evolving right MCA infarct with mild mass-effect.  Assessment/Plan: Right MCA infarct: As I discussed over the phone, there is no indication for prophylactic hemicraniectomy as the patient is doing well clinically.  I would suggest optimal medical management, 3% saline, neurochecks, etc.  If the patient should worsen clinically we could reconsider a hemicraniectomy at that time.  I will follow the patient with you.  Ophelia Charter 07/28/2017, 7:15 PM

## 2017-07-28 NOTE — Progress Notes (Addendum)
CRITICAL VALUE ALERT  Critical Value:  Troponin .16  Date & Time Notied:  5/26 1640  Provider Notified: Neurology  Orders Received/Actions taken: New Order, STAT EKG.

## 2017-07-28 NOTE — ED Notes (Signed)
Patient transported to MRI 

## 2017-07-28 NOTE — Progress Notes (Signed)
Assessed patient at 6pm after nurse states patient had unequal pupils ( left pupil 4mm and reactive and right pupil 2mm and reactive). He also appeared drowsy, could be aroused and followed commands and answered questions.  Apparently this was new exam compared to earlier this morning.   Repeated CT head to assess for midline shift given large R MCA stroke. Ct head does not show significant change compared to CT head done in morning.   Spoke to NS, requesting official consult to discussive early decompressive hemicraniectomy. Conversion unpleasant and NS refused to see patient. Will continue to hypertonic saline and continue to close monitoring for neurological deterioration.     Spent 20 min on assessing this neurologically critically ill patient.

## 2017-07-28 NOTE — ED Notes (Addendum)
Per pt request, for any update or information please call Jone "Lavell Luster [phone]... Leave a message on machine if pt does not answer

## 2017-07-28 NOTE — Consult Note (Signed)
PULMONARY / CRITICAL CARE MEDICINE   Name: [name] [name] MRN: [id number] DOB: 09-19-1965    ADMISSION DATE:  07/27/2017 CONSULTATION DATE: 07/28/2017  REFERRING MD:  hospitalist    CHIEF COMPLAINT:  Altered mental status   HISTORY OF PRESENT ILLNESS:   [name] [name] is a 52 yr old male with HTN and tobacco abuse who was brought in the hospital yesterday after waking up with left sided weakness and refused to come to the hospital until last night when eh was found to have a large right MCA stroke. I was called this morning due to altered mental status. I came to assess the patient on the floor he was arousable answers questions appropriately but still can not move his left side. Denies any headache, nausea or vomiting. Denies chest pain or shortness of breath.   PAST MEDICAL HISTORY :  He  has a past medical history of GERD (gastroesophageal reflux disease), Hypertension, Nephrolithiasis, and Tobacco abuse.  PAST SURGICAL HISTORY: He  has a past surgical history that includes Umbilical hernia repair; Nephrolithotomy (Right, 09/07/2013); Holmium laser application (Right, 09/07/2013); Cystoscopy w/ ureteral stent placement (Bilateral, 09/07/2013); Nephrolithotomy (Right, 09/09/2013); Holmium laser application (Right, 09/09/2013); and Cystoscopy with retrograde pyelogram, ureteroscopy and stent placement (Bilateral, 09/09/2013).  No Known Allergies  No current facility-administered medications on file prior to encounter.    Current Outpatient Medications on File Prior to Encounter  Medication Sig  . lisinopril-hydrochlorothiazide (PRINZIDE,ZESTORETIC) 20-25 MG tablet Take 1 tablet by mouth daily.  . hydrocortisone (ANUSOL-HC) 25 MG suppository Place 1 suppository (25 mg total) rectally at bedtime. For 5 nights then as needed for hemorrhoids (Patient not taking: Reported on 07/28/2017)  . HYDROmorphone (DILAUDID) 2 MG tablet Take 1 tablet (2 mg total) by mouth every 4 (four) hours as needed for moderate pain or  severe pain. Post-operatively (Patient not taking: Reported on 07/28/2017)  . senna-docusate (SENOKOT-S) 8.6-50 MG per tablet Take 1 tablet by mouth 2 (two) times daily. While taking pain meds to prevent constipation (Patient not taking: Reported on 07/28/2017)    FAMILY HISTORY:  His indicated that the status of his mother is unknown. He indicated that the status of his father is unknown. He indicated that the status of his maternal grandmother is unknown. He indicated that the status of his paternal grandmother is unknown. He indicated that the status of his maternal uncle is unknown.   SOCIAL HISTORY: He  reports that he has been smoking cigarettes.  He has a 25.00 pack-year smoking history. He has never used smokeless tobacco. He reports that he drinks alcohol. He reports that he has current or past drug history. Drug: Marijuana.  REVIEW OF SYSTEMS:   All 11 point system review were unremarkable other than what is mentioned in HPI     VITAL SIGNS: BP 136/83 (BP Location: Right Arm)   Pulse 86   Temp 98.1 F (36.7 C) (Oral)   Resp 16   Ht  (1.651 m)   Wt 76.3 kg (168 lb 3.2 oz)   SpO2 99%   BMI 27.99 kg/m   HEMODYNAMICS:    VENTILATOR SETTINGS:    INTAKE / OUTPUT: No intake/output data recorded.  PHYSICAL EXAMINATION: General:  Sleepy but arousable not in distress  Neuro:  Left facial droop. Power 0/5 left arm 1/5 left leg. Normal power on the right  HEENT:  Moist  Cardiovascular:  Normal heart sounds no murmurs  Lungs:  Clear equal air sounds  Abdomen:  Soft no tenderness  Musculoskeletal:  No edema  Skin:  No rash   LABS:  BMET Recent Labs  Lab 07/27/17 2025 07/27/17 2036 07/28/17 0254  NA 137 141  --   K 3.7 3.7  --   CL 102 103  --   CO2 25  --   --   BUN 11 12  --   CREATININE 1.22 1.00 1.12  GLUCOSE 120* 118*  --     Electrolytes Recent Labs  Lab 07/27/17 2025  CALCIUM 9.6    CBC Recent Labs  Lab 07/27/17 2025 07/27/17 2036  07/28/17 0254  WBC 16.1*  --  14.1*  HGB 18.0* 18.0* 18.3*  HCT 52.2* 53.0* 52.1*  PLT 254  --  234    Coag's Recent Labs  Lab 07/27/17 2025  APTT 26  INR 1.05    Sepsis Markers No results for input(s): LATICACIDVEN, PROCALCITON, O2SATVEN in the last 168 hours.  ABG No results for input(s): PHART, PCO2ART, PO2ART in the last 168 hours.  Liver Enzymes Recent Labs  Lab 07/27/17 2025  AST 51*  ALT 37  ALKPHOS 71  BILITOT 1.1  ALBUMIN 4.1    Cardiac Enzymes Recent Labs  Lab 07/28/17 0254  TROPONINI 0.03*    Glucose Recent Labs  Lab 07/27/17 2105  GLUCAP 94    Imaging Ct Angio Head W Or Wo Contrast  Result Date: 07/27/2017 CLINICAL DATA:  Code stroke. Left-sided weakness. Left facial droop. EXAM: CT ANGIOGRAPHY HEAD AND NECK CT PERFUSION BRAIN TECHNIQUE: Multidetector CT imaging of the head and neck was performed using the standard protocol during bolus administration of intravenous contrast. Multiplanar CT image reconstructions and MIPs were obtained to evaluate the vascular anatomy. Carotid stenosis measurements (when applicable) are obtained utilizing NASCET criteria, using the distal internal carotid diameter as the denominator. Multiphase CT imaging of the brain was performed following IV bolus contrast injection. Subsequent parametric perfusion maps were calculated using RAPID software. CONTRAST:  ISOVUE-370 IOPAMIDOL (ISOVUE-370) INJECTION 76% COMPARISON:  CT head without contrast from the same day. FINDINGS: CTA NECK FINDINGS Aortic arch: A 3 vessel arch configuration is present. Atherosclerotic calcifications are present at the origin of the left subclavian artery without a significant stenosis. There is no aneurysm. Right carotid system: Right common carotid artery is within normal limits. There is a high-grade, near occlusive stenosis at the bifurcation. There is marked decrease in caliber of the right cervical ICA with near complete loss of contrast at  the skull base. Left carotid system: Left common carotid artery demonstrates mural calcifications anteriorly. There is calcified and noncalcified plaque in the proximal left internal carotid artery. The lumen is narrowed to 2.7 mm approximately 1 cm from the bifurcation. This compares with a more normal distal measurement 4.8 mm. No tandem stenosis is present. Vertebral arteries: The right vertebral artery is the dominant vessel. Both vertebral arteries originate from the subclavian arteries. There is a moderate stenosis of the proximal right vertebral artery relative to the more distal vessel. There is no other significant stenosis of the vertebral arteries in the neck. Skeleton: Vertebral body heights and alignment are maintained. No focal lytic or blastic lesions are present. Other neck: Soft tissues the neck are otherwise unremarkable. Thyroid is within normal limits. There is no significant adenopathy. Salivary glands are normal. Upper chest: Centrilobular emphysematous changes are present. No focal nodule or mass lesion is present. There is no airspace disease. Review of the MIP images confirms the above findings CTA HEAD FINDINGS Anterior circulation: A  posterior communicating artery is present. There is some reconstitution of the cavernous right internal carotid artery. The petrous segment is markedly narrowed. There is significant narrowing of the supraclinoid right ICA. The right M1 segment is occluded. The right A1 segment is not visualized. Atherosclerotic calcifications are present within the cavernous left internal carotid artery without a significant stenosis through the ICA terminus. The A1 and M1 segments are patent. Anterior communicating artery is patent. Both A2 segments fill from the left. The left MCA bifurcation is intact. Left MCA branch vessels are normal. There is no significant reconstitution of right MCA branch vessels. Very poor collaterals are present. Posterior circulation: The right  vertebral artery is the dominant vessel. PICA origins are visualized and normal. The basilar artery is small. The left posterior cerebral artery originates from the basilar tip. The fetal type right posterior cerebral artery is present. Proximal scratched at there is mild narrowing of the left P2 segment prior to the bifurcation. There is moderate attenuation of PCA branch vessels bilaterally. Venous sinuses: The dural sinuses are patent. Straight sinus and deep cerebral veins are intact. Cortical veins are unremarkable. Anatomic variants: Fetal type right posterior cerebral artery. Review of the MIP images confirms the above findings CT Brain Perfusion Findings: CBF (<30%) Volume: 69mL Perfusion (Tmax>6.0s) volume: Mismatch Volume: 64mL Infarction Location:Right MCA territory CBF < 30% confirms ASPECTS score of at least 4 and possibly 3. The hypoperfusion index is 0.6 consistent with very poor collaterals and a rapidly progressive infarct. IMPRESSION: 1. Large right MCA territory infarct.  CBF< 30% is 69 mL. 2. Hypoperfusion index of 0.6 consistent with very poor collaterals and rapidly progressive infarct. 3. 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 4. Marked decreased caliber of the cervical right internal carotid artery distal to the stenosis. 5. Reconstitution of the right internal carotid artery at the ophthalmic segment with some filling of the fetal type right posterior cerebral artery. 6. Occluded right M1 segment with poor collaterals. 7. Atherosclerotic changes at the left carotid bifurcation without significant stenosis. 8. Left MCA and ACA vessels are within normal limits. 9. Small basilar artery feeds the left posterior cerebral artery. The above was relayed via text pager to Dr. Nicholas Lose on 07/27/2017 at 21:08 . Electronically Signed   By: Marin Roberts M.D.   On: 07/27/2017 21:19   Dg Chest 2 View  Result Date:  07/28/2017 CLINICAL DATA:  TIA.  Patient is unable to move arm. EXAM: CHEST - 2 VIEW COMPARISON:  None. FINDINGS: Slightly shallow inspiration. Normal heart size and pulmonary vascularity. No focal airspace disease or consolidation in the lungs. No blunting of costophrenic angles. No pneumothorax. Mediastinal contours appear intact. Degenerative changes in the spine. IMPRESSION: No active cardiopulmonary disease. Electronically Signed   By: Burman Nieves M.D.   On: 07/28/2017 02:31   Ct Angio Neck W Or Wo Contrast  Result Date: 07/27/2017 CLINICAL DATA:  Code stroke. Left-sided weakness. Left facial droop. EXAM: CT ANGIOGRAPHY HEAD AND NECK CT PERFUSION BRAIN TECHNIQUE: Multidetector CT imaging of the head and neck was performed using the standard protocol during bolus administration of intravenous contrast. Multiplanar CT image reconstructions and MIPs were obtained to evaluate the vascular anatomy. Carotid stenosis measurements (when applicable) are obtained utilizing NASCET criteria, using the distal internal carotid diameter as the denominator. Multiphase CT imaging of the brain was performed following IV bolus contrast injection. Subsequent parametric perfusion maps were calculated using RAPID software. CONTRAST:  ISOVUE-370 IOPAMIDOL (ISOVUE-370) INJECTION 76% COMPARISON:  CT head without contrast from the same day. FINDINGS: CTA NECK FINDINGS Aortic arch: A 3 vessel arch configuration is present. Atherosclerotic calcifications are present at the origin of the left subclavian artery without a significant stenosis. There is no aneurysm. Right carotid system: Right common carotid artery is within normal limits. There is a high-grade, near occlusive stenosis at the bifurcation. There is marked decrease in caliber of the right cervical ICA with near complete loss of contrast at the skull base. Left carotid system: Left common carotid artery demonstrates mural calcifications anteriorly. There is  calcified and noncalcified plaque in the proximal left internal carotid artery. The lumen is narrowed to 2.7 mm approximately 1 cm from the bifurcation. This compares with a more normal distal measurement 4.8 mm. No tandem stenosis is present. Vertebral arteries: The right vertebral artery is the dominant vessel. Both vertebral arteries originate from the subclavian arteries. There is a moderate stenosis of the proximal right vertebral artery relative to the more distal vessel. There is no other significant stenosis of the vertebral arteries in the neck. Skeleton: Vertebral body heights and alignment are maintained. No focal lytic or blastic lesions are present. Other neck: Soft tissues the neck are otherwise unremarkable. Thyroid is within normal limits. There is no significant adenopathy. Salivary glands are normal. Upper chest: Centrilobular emphysematous changes are present. No focal nodule or mass lesion is present. There is no airspace disease. Review of the MIP images confirms the above findings CTA HEAD FINDINGS Anterior circulation: A posterior communicating artery is present. There is some reconstitution of the cavernous right internal carotid artery. The petrous segment is markedly narrowed. There is significant narrowing of the supraclinoid right ICA. The right M1 segment is occluded. The right A1 segment is not visualized. Atherosclerotic calcifications are present within the cavernous left internal carotid artery without a significant stenosis through the ICA terminus. The A1 and M1 segments are patent. Anterior communicating artery is patent. Both A2 segments fill from the left. The left MCA bifurcation is intact. Left MCA branch vessels are normal. There is no significant reconstitution of right MCA branch vessels. Very poor collaterals are present. Posterior circulation: The right vertebral artery is the dominant vessel. PICA origins are visualized and normal. The basilar artery is small. The left  posterior cerebral artery originates from the basilar tip. The fetal type right posterior cerebral artery is present. Proximal scratched at there is mild narrowing of the left P2 segment prior to the bifurcation. There is moderate attenuation of PCA branch vessels bilaterally. Venous sinuses: The dural sinuses are patent. Straight sinus and deep cerebral veins are intact. Cortical veins are unremarkable. Anatomic variants: Fetal type right posterior cerebral artery. Review of the MIP images confirms the above findings CT Brain Perfusion Findings: CBF (<30%) Volume: 69mL Perfusion (Tmax>6.0s) volume: Mismatch Volume: 64mL Infarction Location:Right MCA territory CBF < 30% confirms ASPECTS score of at least 4 and possibly 3. The hypoperfusion index is 0.6 consistent with very poor collaterals and a rapidly progressive infarct. IMPRESSION: 1. Large right MCA territory infarct.  CBF< 30% is 69 mL. 2. Hypoperfusion index of 0.6 consistent with very poor collaterals and rapidly progressive infarct. 3. 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 4. Marked decreased caliber of the cervical right internal carotid artery distal to the stenosis. 5. Reconstitution of the right internal carotid artery at the ophthalmic segment with some filling of the  fetal type right posterior cerebral artery. 6. Occluded right M1 segment with poor collaterals. 7. Atherosclerotic changes at the left carotid bifurcation without significant stenosis. 8. Left MCA and ACA vessels are within normal limits. 9. Small basilar artery feeds the left posterior cerebral artery. The above was relayed via text pager to Dr. Nicholas Lose on 07/27/2017 at 21:08 . Electronically Signed   By: Marin Roberts M.D.   On: 07/27/2017 21:19   Ct Cerebral Perfusion W Contrast  Result Date: 07/27/2017 CLINICAL DATA:  Code stroke. Left-sided weakness. Left facial droop. EXAM: CT ANGIOGRAPHY HEAD  AND NECK CT PERFUSION BRAIN TECHNIQUE: Multidetector CT imaging of the head and neck was performed using the standard protocol during bolus administration of intravenous contrast. Multiplanar CT image reconstructions and MIPs were obtained to evaluate the vascular anatomy. Carotid stenosis measurements (when applicable) are obtained utilizing NASCET criteria, using the distal internal carotid diameter as the denominator. Multiphase CT imaging of the brain was performed following IV bolus contrast injection. Subsequent parametric perfusion maps were calculated using RAPID software. CONTRAST:  ISOVUE-370 IOPAMIDOL (ISOVUE-370) INJECTION 76% COMPARISON:  CT head without contrast from the same day. FINDINGS: CTA NECK FINDINGS Aortic arch: A 3 vessel arch configuration is present. Atherosclerotic calcifications are present at the origin of the left subclavian artery without a significant stenosis. There is no aneurysm. Right carotid system: Right common carotid artery is within normal limits. There is a high-grade, near occlusive stenosis at the bifurcation. There is marked decrease in caliber of the right cervical ICA with near complete loss of contrast at the skull base. Left carotid system: Left common carotid artery demonstrates mural calcifications anteriorly. There is calcified and noncalcified plaque in the proximal left internal carotid artery. The lumen is narrowed to 2.7 mm approximately 1 cm from the bifurcation. This compares with a more normal distal measurement 4.8 mm. No tandem stenosis is present. Vertebral arteries: The right vertebral artery is the dominant vessel. Both vertebral arteries originate from the subclavian arteries. There is a moderate stenosis of the proximal right vertebral artery relative to the more distal vessel. There is no other significant stenosis of the vertebral arteries in the neck. Skeleton: Vertebral body heights and alignment are maintained. No focal lytic or blastic  lesions are present. Other neck: Soft tissues the neck are otherwise unremarkable. Thyroid is within normal limits. There is no significant adenopathy. Salivary glands are normal. Upper chest: Centrilobular emphysematous changes are present. No focal nodule or mass lesion is present. There is no airspace disease. Review of the MIP images confirms the above findings CTA HEAD FINDINGS Anterior circulation: A posterior communicating artery is present. There is some reconstitution of the cavernous right internal carotid artery. The petrous segment is markedly narrowed. There is significant narrowing of the supraclinoid right ICA. The right M1 segment is occluded. The right A1 segment is not visualized. Atherosclerotic calcifications are present within the cavernous left internal carotid artery without a significant stenosis through the ICA terminus. The A1 and M1 segments are patent. Anterior communicating artery is patent. Both A2 segments fill from the left. The left MCA bifurcation is intact. Left MCA branch vessels are normal. There is no significant reconstitution of right MCA branch vessels. Very poor collaterals are present. Posterior circulation: The right vertebral artery is the dominant vessel. PICA origins are visualized and normal. The basilar artery is small. The left posterior cerebral artery originates from the basilar tip. The fetal type right posterior cerebral artery is present. Proximal scratched at  there is mild narrowing of the left P2 segment prior to the bifurcation. There is moderate attenuation of PCA branch vessels bilaterally. Venous sinuses: The dural sinuses are patent. Straight sinus and deep cerebral veins are intact. Cortical veins are unremarkable. Anatomic variants: Fetal type right posterior cerebral artery. Review of the MIP images confirms the above findings CT Brain Perfusion Findings: CBF (<30%) Volume: 69mL Perfusion (Tmax>6.0s) volume: Mismatch Volume: 64mL Infarction  Location:Right MCA territory CBF < 30% confirms ASPECTS score of at least 4 and possibly 3. The hypoperfusion index is 0.6 consistent with very poor collaterals and a rapidly progressive infarct. IMPRESSION: 1. Large right MCA territory infarct.  CBF< 30% is 69 mL. 2. Hypoperfusion index of 0.6 consistent with very poor collaterals and rapidly progressive infarct. 3. 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 4. Marked decreased caliber of the cervical right internal carotid artery distal to the stenosis. 5. Reconstitution of the right internal carotid artery at the ophthalmic segment with some filling of the fetal type right posterior cerebral artery. 6. Occluded right M1 segment with poor collaterals. 7. Atherosclerotic changes at the left carotid bifurcation without significant stenosis. 8. Left MCA and ACA vessels are within normal limits. 9. Small basilar artery feeds the left posterior cerebral artery. The above was relayed via text pager to Dr. Nicholas Lose on 07/27/2017 at 21:08 . Electronically Signed   By: Marin Roberts M.D.   On: 07/27/2017 21:19   Dg Foot 2 Views Left  Result Date: 07/27/2017 CLINICAL DATA:  Fall with foot pain EXAM: LEFT FOOT - 2 VIEW COMPARISON:  None. FINDINGS: There is no evidence of fracture or dislocation. There is no evidence of arthropathy or other focal bone abnormality. Soft tissues are unremarkable. IMPRESSION: Negative. Electronically Signed   By: Deatra Robinson M.D.   On: 07/27/2017 22:56   Ct Head Code Stroke Wo Contrast  Result Date: 07/27/2017 CLINICAL DATA:  Code stroke. Acute onset of left-sided weakness and facial droop beginning at 7 a.m. today, 13.5 hours ago. EXAM: CT HEAD WITHOUT CONTRAST TECHNIQUE: Contiguous axial images were obtained from the base of the skull through the vertex without intravenous contrast. COMPARISON:  None. FINDINGS: Brain: There is diffuse loss of gray-white  differentiation in the right temporal lobe. There is hypoattenuation along the right insular cortex and the right lentiform nucleus. Hypoattenuation involves the right caudate head and anterior limb internal capsule. Subcortical white matter hypoattenuation is present in the super ganglionic region without additional cortical lesions. There is partial effacement of the right lateral ventricle and sulci on the right. The brainstem and cerebellum are within normal limits. Vascular: Atherosclerotic changes are present within the cavernous internal carotid arteries bilaterally. A hyperdense right MCA. Hyperdense vessels are seen beyond bifurcation. Skull: Calvarium is intact. No focal lytic or blastic lesions are present. Sinuses/Orbits: Mild mucosal thickening is present in the left maxillary sinus. There is opacification of a single right ethmoid air cell. No fluid levels are present. Globes and orbits are within normal limits. ASPECTS [facility] Stroke Program Early CT Score) - Ganglionic level infarction (caudate, lentiform nuclei, internal capsule, insula, M1-M3 cortex): 1/7 - Supraganglionic infarction (M4-M6 cortex): 3/3 Total score (0-10 with 10 being normal): 4/7 IMPRESSION: 1. Large right MCA territory infarct involving the insular cortex, lentiform nucleus, anterior limb internal capsule, caudate head, and right temporal lobe. This results in some mass effect with effacement of the sulci and right lateral ventricle but no hemorrhage.  2. Hyperdense right MCA extending beyond the bifurcation compatible with thrombus. 3. Mild diffuse subcortical white matter hypoattenuation bilaterally likely reflects microvascular ischemia. 4. ASPECTS is 4/10 The above was relayed via text pager to Dr. Nicholas Lose on 07/27/2017 at 20:40 . Electronically Signed   By: Marin Roberts M.D.   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 Could be contributing to him being sleepy   - ? NSx input after the result of the CT head? Decompression if needed since massive stroke - ASA - DVT prophylaxis  - BP control   Pulmonary and Critical Care Medicine [facility] Pager: [phone]  07/28/2017, 9:35 AM

## 2017-07-28 NOTE — Consult Note (Addendum)
New Carotid Patient  Requested by:  Dr. Margaretmary Eddy  Reason for consultation:  Right carotid stenosis, large CVA  History of Present Illness   [name] [name] is a 52 y.o. (August 27, 1965) male who presents with chief complaint: left side weakness.  History is obtained from patient has his ability to speak is extremely limited currently.  Reportedly he work up yesterday with weakness throughout left side.  He resisted going the ED and was not evaluated until the PM yesterday.  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  While admitted, his neurologic status worsened and he was transferred to the Neuro ICU.  The patient   .Reported the patient has never had any CVA or TIA.  His atherosclerotic risk factors include: HTN and active smoking.  Past Medical History:  Diagnosis Date  . GERD (gastroesophageal reflux disease)   . Hypertension   . Nephrolithiasis   . Tobacco abuse     Past Surgical History:  Procedure Laterality Date  . CYSTOSCOPY W/ URETERAL STENT PLACEMENT Bilateral 09/07/2013   Procedure: CYSTOSCOPY WITH RETROGRADE PYELOGRAM/URETERAL STENT PLACEMENT;  Surgeon: Sebastian Ache, MD;  Location: [facility];  Service: Urology;  Laterality: Bilateral;  . CYSTOSCOPY WITH RETROGRADE PYELOGRAM, URETEROSCOPY AND STENT PLACEMENT Bilateral 09/09/2013   Procedure: CYSTOSCOPY WITH RETROGRADE PYELOGRAM, DIAGNOSTIC URETEROSCOPY AND STENT CHANGE;  Surgeon: Sebastian Ache, MD;  Location: [facility];  Service: Urology;  Laterality: Bilateral;  . HOLMIUM LASER APPLICATION Right 09/07/2013   Procedure: HOLMIUM LASER APPLICATION;  Surgeon: Sebastian Ache, MD;  Location: [facility];  Service: Urology;  Laterality: Right;  . HOLMIUM LASER APPLICATION Right 09/09/2013   Procedure: HOLMIUM LASER APPLICATION;  Surgeon: Sebastian Ache, MD;  Location: [facility];  Service: Urology;  Laterality: Right;  . NEPHROLITHOTOMY Right 09/07/2013   Procedure: FIRST STAGE RIGHT PERCUTANEOUS NEPHROLITHOTOMY  WITH ACCESS, ;  Surgeon: Sebastian Ache,  MD;  Location: [facility];  Service: Urology;  Laterality: Right;  . NEPHROLITHOTOMY Right 09/09/2013   Procedure: RIGHT 2ND STAGE NEPHROLITHOTOMY PERCUTANEOUS;  Surgeon: Sebastian Ache, MD;  Location: [facility];  Service: Urology;  Laterality: Right;  . UMBILICAL HERNIA REPAIR      Social History   Socioeconomic History  . Marital status: Single    Spouse name: Not on file  . Number of children: 0  . Years of education: Not on file  . Highest education level: Not on file  Occupational History  . Occupation: Stage manager  Social Needs  . Financial resource strain: Not on file  . Food insecurity:    Worry: Not on file    Inability: Not on file  . Transportation needs:    Medical: Not on file    Non-medical: Not on file  Tobacco Use  . Smoking status: Current Every Day Smoker    Packs/day: 1.00    Years: 25.00    Pack years: 25.00    Types: Cigarettes  . Smokeless tobacco: Never Used  Substance and Sexual Activity  . Alcohol use: Yes    Comment: 12 mixed drinks per week   . Drug use: Yes    Types: Marijuana  . Sexual activity: Not on file  Lifestyle  . Physical activity:    Days per week: Not on file    Minutes per session: Not on file  . Stress: Not on file  Relationships  . Social connections:    Talks on phone: Not on file    Gets together: Not on file    Attends religious  service: Not on file    Active member of club or organization: Not on file    Attends meetings of clubs or organizations: Not on file    Relationship status: Not on file  . Intimate partner violence:    Fear of current or ex partner: Not on file    Emotionally abused: Not on file    Physically abused: Not on file    Forced sexual activity: Not on file  Other Topics Concern  . 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 Prostate cancer Father 7  . Lung cancer  Father   . Nephrolithiasis Father   . Ovarian cancer Mother   . CVA Paternal Grandmother        in her 76's  . Cancer Paternal Grandmother        eye  . Diabetes Maternal Grandmother   . Alcoholism Maternal Uncle        x 3    Current Facility-Administered Medications  Medication Dose Route Frequency Provider Last Rate Last Dose  .  stroke: mapping our early stages of recovery book   Does not apply Once Zigmund Daniel., MD      . acetaminophen (TYLENOL) tablet 650 mg  650 mg Oral Q4H PRN Zigmund Daniel., MD   650 mg at 07/28/17 1610   Or  . acetaminophen (TYLENOL) solution 650 mg  650 mg Per Tube Q4H PRN Zigmund Daniel., MD       Or  . acetaminophen (TYLENOL) suppository 650 mg  650 mg Rectal Q4H PRN Zigmund Daniel., MD      . aspirin suppository 300 mg  300 mg Rectal Daily Zigmund Daniel., MD       Or  . aspirin tablet 325 mg  325 mg Oral Daily Zigmund Daniel., MD   325 mg at 07/28/17 1435  . enoxaparin (LOVENOX) injection 40 mg  40 mg Subcutaneous Daily Zigmund Daniel., MD   40 mg at 07/28/17 1536  . senna-docusate (Senokot-S) tablet 1 tablet  1 tablet Oral QHS PRN Zigmund Daniel., MD      . sodium chloride (hypertonic) 3 % solution   Intravenous Continuous Zigmund Daniel., MD 75 mL/hr at 07/28/17 1328 75 mL/hr at 07/28/17 1328    No Known Allergies  REVIEW OF SYSTEMS (negative unless checked):   Cardiac:  []  Chest pain or chest pressure? []  Shortness of breath upon activity? []  Shortness of breath when lying flat? []  Irregular heart rhythm?  Vascular:  []  Pain in calf, thigh, or hip brought on by walking? []  Pain in feet at night that wakes you up from your sleep? []  Blood clot in your veins? []  Leg swelling?  Pulmonary:  []  Oxygen at home? []  Productive cough? []  Wheezing?  Neurologic:  [x]  Sudden weakness in arms or legs? [x]  Sudden numbness in arms or legs? [x]  Sudden onset of difficult speaking  or slurred speech? []  Temporary loss of vision in one eye? []  Problems with dizziness?  Gastrointestinal:  []  Blood in stool? []  Vomited blood?  Genitourinary:  []  Burning when urinating? []  Blood in urine?  Psychiatric:  []  Major depression  Hematologic:  []  Bleeding problems? []  Problems with blood clotting?  Dermatologic:  []   Rashes or ulcers?  Constitutional:  []  Fever or chills?  Ear/Nose/Throat:  []  Change in hearing? []  Nose bleeds? []  Sore throat?  Musculoskeletal:  []  Back pain? []  Joint pain? []  Muscle pain?   For VQI Use Only   PRE-ADM LIVING Home  AMB STATUS Ambulatory  CAD Sx None  PRIOR CHF None  STRESS TEST No    Physical Examination     Vitals:   07/28/17 1515 07/28/17 1530 07/28/17 1600 07/28/17 1700  BP: 107/80  (!) 151/86 (!) 144/89  Pulse: 83 84 81 77  Resp: 20 20 20 20   Temp:   98.3 F (36.8 C)   TempSrc:   Oral   SpO2: 95% 94% 95% 97%  Weight:      Height:       Body mass index is 27.99 kg/m.  General Alert, O x 3, WD, WN, limited speech  Head [place]/AT,    Ear/Nose/ Throat Hearing grossly intact, nares without erythema or drainage, oropharynx without Erythema or Exudate, Mallampati score: 3,   Eyes PERRL, some L eye photophobia, some dysconjugate gaze, minimal difference in pupil size,    Neck Supple, mid-line trachea,    Pulmonary Sym exp, good B air movt, CTA B  Cardiac RRR, Nl S1, S2, no Murmurs, No rubs, No S3,S4  Vascular Vessel Right Left  Radial Palpable Palpable  Brachial Palpable Palpable  Carotid Palpable, No Bruit Palpable, No Bruit  Aorta Not palpable N/A  Femoral Palpable Palpable  Popliteal Not palpable Not palpable  PT Palpable Palpable  DP Palpable Palpable    Gastro- intestinal soft, non-distended, non-tender to palpation, No guarding or rebound, no HSM, no masses, no CVAT B, No palpable prominent aortic pulse,    Musculo- skeletal M/S 5/5 BUE and BLE, LUE 0/5, LLE 3/5 , Extremities without  ischemic changes  , No edema present, No visible varicosities , No Lipodermatosclerosis present  Neurologic Cranial nerves 2-12 intact except no L shoulder elevation, Pain and light touch intact in extremities , Motor exam as listed above  Psychiatric Pt unable to carry on enough of conversation to evaluate  Dermatologic See M/S exam for extremity exam, No rashes otherwise noted  Lymphatic  Palpable lymph nodes: None   Laboratory   CBC CBC Latest Ref Rng & Units 07/28/2017 07/27/2017 07/27/2017  WBC 4.0 - 10.5 K/uL 14.1(H) - 16.1(H)  Hemoglobin 13.0 - 17.0 g/dL 18.3(H) 18.0(H) 18.0(H)  Hematocrit 39.0 - 52.0 % 52.1(H) 53.0(H) 52.2(H)  Platelets 150 - 400 K/uL 234 - 254    BMP BMP Latest Ref Rng & Units 07/28/2017 07/27/2017 07/27/2017  Glucose 65 - 99 mg/dL - 161(W) 960(A)  BUN 6 - 20 mg/dL - 12 11  Creatinine 5.40 - 1.24 mg/dL 9.81 1.91 4.78  Sodium 135 - 145 mmol/L 139 141 137  Potassium 3.5 - 5.1 mmol/L - 3.7 3.7  Chloride 101 - 111 mmol/L - 103 102  CO2 22 - 32 mmol/L - - 25  Calcium 8.9 - 10.3 mg/dL - - 9.6    Coagulation Lab Results  Component Value Date   INR 1.05 07/27/2017   No results found for: PTT  Lipids    Component Value Date/Time   CHOL 172 07/28/2017 0848   TRIG 99 07/28/2017 0848   HDL 46 07/28/2017 0848   CHOLHDL 3.7 07/28/2017 0848   VLDL 20 07/28/2017 0848   LDLCALC 106 (H) 07/28/2017 0848   UDS (07/28/17): +Cocaine   Radiology     Ct Angio Head W  Or Wo Contrast  Result Date: 07/27/2017 CLINICAL DATA:  Code stroke. Left-sided weakness. Left facial droop. EXAM: CT ANGIOGRAPHY HEAD AND NECK CT PERFUSION BRAIN TECHNIQUE: Multidetector CT imaging of the head and neck was performed using the standard protocol during bolus administration of intravenous contrast. Multiplanar CT image reconstructions and MIPs were obtained to evaluate the vascular anatomy. Carotid stenosis measurements (when applicable) are obtained utilizing NASCET criteria, using the  distal internal carotid diameter as the denominator. Multiphase CT imaging of the brain was performed following IV bolus contrast injection. Subsequent parametric perfusion maps were calculated using RAPID software. CONTRAST:  ISOVUE-370 IOPAMIDOL (ISOVUE-370) INJECTION 76% COMPARISON:  CT head without contrast from the same day. FINDINGS: CTA NECK FINDINGS Aortic arch: A 3 vessel arch configuration is present. Atherosclerotic calcifications are present at the origin of the left subclavian artery without a significant stenosis. There is no aneurysm. Right carotid system: Right common carotid artery is within normal limits. There is a high-grade, near occlusive stenosis at the bifurcation. There is marked decrease in caliber of the right cervical ICA with near complete loss of contrast at the skull base. Left carotid system: Left common carotid artery demonstrates mural calcifications anteriorly. There is calcified and noncalcified plaque in the proximal left internal carotid artery. The lumen is narrowed to 2.7 mm approximately 1 cm from the bifurcation. This compares with a more normal distal measurement 4.8 mm. No tandem stenosis is present. Vertebral arteries: The right vertebral artery is the dominant vessel. Both vertebral arteries originate from the subclavian arteries. There is a moderate stenosis of the proximal right vertebral artery relative to the more distal vessel. There is no other significant stenosis of the vertebral arteries in the neck. Skeleton: Vertebral body heights and alignment are maintained. No focal lytic or blastic lesions are present. Other neck: Soft tissues the neck are otherwise unremarkable. Thyroid is within normal limits. There is no significant adenopathy. Salivary glands are normal. Upper chest: Centrilobular emphysematous changes are present. No focal nodule or mass lesion is present. There is no airspace disease. Review of the MIP images confirms the above findings CTA  HEAD FINDINGS Anterior circulation: A posterior communicating artery is present. There is some reconstitution of the cavernous right internal carotid artery. The petrous segment is markedly narrowed. There is significant narrowing of the supraclinoid right ICA. The right M1 segment is occluded. The right A1 segment is not visualized. Atherosclerotic calcifications are present within the cavernous left internal carotid artery without a significant stenosis through the ICA terminus. The A1 and M1 segments are patent. Anterior communicating artery is patent. Both A2 segments fill from the left. The left MCA bifurcation is intact. Left MCA branch vessels are normal. There is no significant reconstitution of right MCA branch vessels. Very poor collaterals are present. Posterior circulation: The right vertebral artery is the dominant vessel. PICA origins are visualized and normal. The basilar artery is small. The left posterior cerebral artery originates from the basilar tip. The fetal type right posterior cerebral artery is present. Proximal scratched at there is mild narrowing of the left P2 segment prior to the bifurcation. There is moderate attenuation of PCA branch vessels bilaterally. Venous sinuses: The dural sinuses are patent. Straight sinus and deep cerebral veins are intact. Cortical veins are unremarkable. Anatomic variants: Fetal type right posterior cerebral artery. Review of the MIP images confirms the above findings CT Brain Perfusion Findings: CBF (<30%) Volume: 69mL Perfusion (Tmax>6.0s) volume: Mismatch Volume: 64mL Infarction Location:Right MCA territory CBF <  30% confirms ASPECTS score of at least 4 and possibly 3. The hypoperfusion index is 0.6 consistent with very poor collaterals and a rapidly progressive infarct. IMPRESSION: 1. Large right MCA territory infarct.  CBF< 30% is 69 mL. 2. Hypoperfusion index of 0.6 consistent with very poor collaterals and rapidly progressive infarct. 3.  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 4. Marked decreased caliber of the cervical right internal carotid artery distal to the stenosis. 5. Reconstitution of the right internal carotid artery at the ophthalmic segment with some filling of the fetal type right posterior cerebral artery. 6. Occluded right M1 segment with poor collaterals. 7. Atherosclerotic changes at the left carotid bifurcation without significant stenosis. 8. Left MCA and ACA vessels are within normal limits. 9. Small basilar artery feeds the left posterior cerebral artery. The above was relayed via text pager to Dr. Nicholas Lose on 07/27/2017 at 21:08 . Electronically Signed   By: Marin Roberts M.D.   On: 07/27/2017 21:19   Dg Chest 2 View  Result Date: 07/28/2017 CLINICAL DATA:  TIA.  Patient is unable to move arm. EXAM: CHEST - 2 VIEW COMPARISON:  None. FINDINGS: Slightly shallow inspiration. Normal heart size and pulmonary vascularity. No focal airspace disease or consolidation in the lungs. No blunting of costophrenic angles. No pneumothorax. Mediastinal contours appear intact. Degenerative changes in the spine. IMPRESSION: No active cardiopulmonary disease. Electronically Signed   By: Burman Nieves M.D.   On: 07/28/2017 02:31   Ct Head Wo Contrast  Result Date: 07/28/2017 CLINICAL DATA:  Stroke, follow-up. EXAM: CT HEAD WITHOUT CONTRAST TECHNIQUE: Contiguous axial images were obtained from the base of the skull through the vertex without intravenous contrast. COMPARISON:  CT head without contrast and CT perfusion 07/27/2017. FINDINGS: Brain: There is expected evolution of the large right MCA territory infarct. Diffuse hypoattenuation is present throughout the anterior right temporal lobe. There is diffuse hypoattenuation involving the right insular cortex and basal ganglia. There is involvement of the insular scratched at there is involvement of the right  internal capsule. Super ganglionic cortex is preserved. There is further effacement of the right lateral ventricle without downward herniation or significant midline shift. The brainstem and cerebellum are within normal limits. Vascular: Hyperdense right MCA is again noted. Atherosclerotic calcifications are present. Skull: Calvarium is intact. No focal lytic or blastic lesions are present. Sinuses/Orbits: A right ethmoid air cells opacified. The remaining paranasal sinuses and the mastoid air cells are clear. Globes and orbits are within normal limits. IMPRESSION: 1. Expected evolution of large right MCA territory infarct without interval hemorrhage. 2. Progressive mass effect with effacement of the sulci and right lateral ventricle without significant midline shift or downward herniation. Electronically Signed   By: Marin Roberts M.D.   On: 07/28/2017 11:24   Ct Angio Neck W Or Wo Contrast  Result Date: 07/27/2017 CLINICAL DATA:  Code stroke. Left-sided weakness. Left facial droop. EXAM: CT ANGIOGRAPHY HEAD AND NECK CT PERFUSION BRAIN TECHNIQUE: Multidetector CT imaging of the head and neck was performed using the standard protocol during bolus administration of intravenous contrast. Multiplanar CT image reconstructions and MIPs were obtained to evaluate the vascular anatomy. Carotid stenosis measurements (when applicable) are obtained utilizing NASCET criteria, using the distal internal carotid diameter as the denominator. Multiphase CT imaging of the brain was performed following IV bolus contrast injection. Subsequent parametric perfusion maps were calculated using RAPID software. CONTRAST:  ISOVUE-370 IOPAMIDOL (ISOVUE-370) INJECTION 76% COMPARISON:  CT head without contrast from the same day. FINDINGS: CTA NECK FINDINGS Aortic arch: A 3 vessel arch configuration is present. Atherosclerotic calcifications are present at the origin of the left subclavian artery without a significant stenosis.  There is no aneurysm. Right carotid system: Right common carotid artery is within normal limits. There is a high-grade, near occlusive stenosis at the bifurcation. There is marked decrease in caliber of the right cervical ICA with near complete loss of contrast at the skull base. Left carotid system: Left common carotid artery demonstrates mural calcifications anteriorly. There is calcified and noncalcified plaque in the proximal left internal carotid artery. The lumen is narrowed to 2.7 mm approximately 1 cm from the bifurcation. This compares with a more normal distal measurement 4.8 mm. No tandem stenosis is present. Vertebral arteries: The right vertebral artery is the dominant vessel. Both vertebral arteries originate from the subclavian arteries. There is a moderate stenosis of the proximal right vertebral artery relative to the more distal vessel. There is no other significant stenosis of the vertebral arteries in the neck. Skeleton: Vertebral body heights and alignment are maintained. No focal lytic or blastic lesions are present. Other neck: Soft tissues the neck are otherwise unremarkable. Thyroid is within normal limits. There is no significant adenopathy. Salivary glands are normal. Upper chest: Centrilobular emphysematous changes are present. No focal nodule or mass lesion is present. There is no airspace disease. Review of the MIP images confirms the above findings CTA HEAD FINDINGS Anterior circulation: A posterior communicating artery is present. There is some reconstitution of the cavernous right internal carotid artery. The petrous segment is markedly narrowed. There is significant narrowing of the supraclinoid right ICA. The right M1 segment is occluded. The right A1 segment is not visualized. Atherosclerotic calcifications are present within the cavernous left internal carotid artery without a significant stenosis through the ICA terminus. The A1 and M1 segments are patent. Anterior communicating  artery is patent. Both A2 segments fill from the left. The left MCA bifurcation is intact. Left MCA branch vessels are normal. There is no significant reconstitution of right MCA branch vessels. Very poor collaterals are present. Posterior circulation: The right vertebral artery is the dominant vessel. PICA origins are visualized and normal. The basilar artery is small. The left posterior cerebral artery originates from the basilar tip. The fetal type right posterior cerebral artery is present. Proximal scratched at there is mild narrowing of the left P2 segment prior to the bifurcation. There is moderate attenuation of PCA branch vessels bilaterally. Venous sinuses: The dural sinuses are patent. Straight sinus and deep cerebral veins are intact. Cortical veins are unremarkable. Anatomic variants: Fetal type right posterior cerebral artery. Review of the MIP images confirms the above findings CT Brain Perfusion Findings: CBF (<30%) Volume: 69mL Perfusion (Tmax>6.0s) volume: Mismatch Volume: 64mL Infarction Location:Right MCA territory CBF < 30% confirms ASPECTS score of at least 4 and possibly 3. The hypoperfusion index is 0.6 consistent with very poor collaterals and a rapidly progressive infarct. IMPRESSION: 1. Large right MCA territory infarct.  CBF< 30% is 69 mL. 2. Hypoperfusion index of 0.6 consistent with very poor collaterals and rapidly progressive infarct. 3. 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 4. Marked decreased caliber of the cervical right internal carotid artery distal to the stenosis. 5. Reconstitution of the right internal carotid artery at the ophthalmic segment with some filling of the fetal type right posterior cerebral artery. 6. Occluded  right M1 segment with poor collaterals. 7. Atherosclerotic changes at the left carotid bifurcation without significant stenosis. 8. Left MCA and ACA vessels are within normal  limits. 9. Small basilar artery feeds the left posterior cerebral artery. The above was relayed via text pager to Dr. Nicholas Lose on 07/27/2017 at 21:08 . Electronically Signed   By: Marin Roberts M.D.   On: 07/27/2017 21:19   Ct Cerebral Perfusion W Contrast  Result Date: 07/27/2017 CLINICAL DATA:  Code stroke. Left-sided weakness. Left facial droop. EXAM: CT ANGIOGRAPHY HEAD AND NECK CT PERFUSION BRAIN TECHNIQUE: Multidetector CT imaging of the head and neck was performed using the standard protocol during bolus administration of intravenous contrast. Multiplanar CT image reconstructions and MIPs were obtained to evaluate the vascular anatomy. Carotid stenosis measurements (when applicable) are obtained utilizing NASCET criteria, using the distal internal carotid diameter as the denominator. Multiphase CT imaging of the brain was performed following IV bolus contrast injection. Subsequent parametric perfusion maps were calculated using RAPID software. CONTRAST:  ISOVUE-370 IOPAMIDOL (ISOVUE-370) INJECTION 76% COMPARISON:  CT head without contrast from the same day. FINDINGS: CTA NECK FINDINGS Aortic arch: A 3 vessel arch configuration is present. Atherosclerotic calcifications are present at the origin of the left subclavian artery without a significant stenosis. There is no aneurysm. Right carotid system: Right common carotid artery is within normal limits. There is a high-grade, near occlusive stenosis at the bifurcation. There is marked decrease in caliber of the right cervical ICA with near complete loss of contrast at the skull base. Left carotid system: Left common carotid artery demonstrates mural calcifications anteriorly. There is calcified and noncalcified plaque in the proximal left internal carotid artery. The lumen is narrowed to 2.7 mm approximately 1 cm from the bifurcation. This compares with a more normal distal measurement 4.8 mm. No tandem stenosis is present. Vertebral arteries:  The right vertebral artery is the dominant vessel. Both vertebral arteries originate from the subclavian arteries. There is a moderate stenosis of the proximal right vertebral artery relative to the more distal vessel. There is no other significant stenosis of the vertebral arteries in the neck. Skeleton: Vertebral body heights and alignment are maintained. No focal lytic or blastic lesions are present. Other neck: Soft tissues the neck are otherwise unremarkable. Thyroid is within normal limits. There is no significant adenopathy. Salivary glands are normal. Upper chest: Centrilobular emphysematous changes are present. No focal nodule or mass lesion is present. There is no airspace disease. Review of the MIP images confirms the above findings CTA HEAD FINDINGS Anterior circulation: A posterior communicating artery is present. There is some reconstitution of the cavernous right internal carotid artery. The petrous segment is markedly narrowed. There is significant narrowing of the supraclinoid right ICA. The right M1 segment is occluded. The right A1 segment is not visualized. Atherosclerotic calcifications are present within the cavernous left internal carotid artery without a significant stenosis through the ICA terminus. The A1 and M1 segments are patent. Anterior communicating artery is patent. Both A2 segments fill from the left. The left MCA bifurcation is intact. Left MCA branch vessels are normal. There is no significant reconstitution of right MCA branch vessels. Very poor collaterals are present. Posterior circulation: The right vertebral artery is the dominant vessel. PICA origins are visualized and normal. The basilar artery is small. The left posterior cerebral artery originates from the basilar tip. The fetal type right posterior cerebral artery is present. Proximal scratched at there is mild narrowing of the left P2  segment prior to the bifurcation. There is moderate attenuation of PCA branch vessels  bilaterally. Venous sinuses: The dural sinuses are patent. Straight sinus and deep cerebral veins are intact. Cortical veins are unremarkable. Anatomic variants: Fetal type right posterior cerebral artery. Review of the MIP images confirms the above findings CT Brain Perfusion Findings: CBF (<30%) Volume: 69mL Perfusion (Tmax>6.0s) volume: Mismatch Volume: 64mL Infarction Location:Right MCA territory CBF < 30% confirms ASPECTS score of at least 4 and possibly 3. The hypoperfusion index is 0.6 consistent with very poor collaterals and a rapidly progressive infarct. IMPRESSION: 1. Large right MCA territory infarct.  CBF< 30% is 69 mL. 2. Hypoperfusion index of 0.6 consistent with very poor collaterals and rapidly progressive infarct. 3. 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 4. Marked decreased caliber of the cervical right internal carotid artery distal to the stenosis. 5. Reconstitution of the right internal carotid artery at the ophthalmic segment with some filling of the fetal type right posterior cerebral artery. 6. Occluded right M1 segment with poor collaterals. 7. Atherosclerotic changes at the left carotid bifurcation without significant stenosis. 8. Left MCA and ACA vessels are within normal limits. 9. Small basilar artery feeds the left posterior cerebral artery. The above was relayed via text pager to Dr. Nicholas Lose on 07/27/2017 at 21:08 . Electronically Signed   By: Marin Roberts M.D.   On: 07/27/2017 21:19   Dg Foot 2 Views Left  Result Date: 07/27/2017 CLINICAL DATA:  Fall with foot pain EXAM: LEFT FOOT - 2 VIEW COMPARISON:  None. FINDINGS: There is no evidence of fracture or dislocation. There is no evidence of arthropathy or other focal bone abnormality. Soft tissues are unremarkable. IMPRESSION: Negative. Electronically Signed   By: Deatra Robinson M.D.   On: 07/27/2017 22:56   Ct Head Code Stroke Wo  Contrast  Result Date: 07/27/2017 CLINICAL DATA:  Code stroke. Acute onset of left-sided weakness and facial droop beginning at 7 a.m. today, 13.5 hours ago. EXAM: CT HEAD WITHOUT CONTRAST TECHNIQUE: Contiguous axial images were obtained from the base of the skull through the vertex without intravenous contrast. COMPARISON:  None. FINDINGS: Brain: There is diffuse loss of gray-white differentiation in the right temporal lobe. There is hypoattenuation along the right insular cortex and the right lentiform nucleus. Hypoattenuation involves the right caudate head and anterior limb internal capsule. Subcortical white matter hypoattenuation is present in the super ganglionic region without additional cortical lesions. There is partial effacement of the right lateral ventricle and sulci on the right. The brainstem and cerebellum are within normal limits. Vascular: Atherosclerotic changes are present within the cavernous internal carotid arteries bilaterally. A hyperdense right MCA. Hyperdense vessels are seen beyond bifurcation. Skull: Calvarium is intact. No focal lytic or blastic lesions are present. Sinuses/Orbits: Mild mucosal thickening is present in the left maxillary sinus. There is opacification of a single right ethmoid air cell. No fluid levels are present. Globes and orbits are within normal limits. ASPECTS [facility] Stroke Program Early CT Score) - Ganglionic level infarction (caudate, lentiform nuclei, internal capsule, insula, M1-M3 cortex): 1/7 - Supraganglionic infarction (M4-M6 cortex): 3/3 Total score (0-10 with 10 being normal): 4/7 IMPRESSION: 1. Large right MCA territory infarct involving the insular cortex, lentiform nucleus, anterior limb internal capsule, caudate head, and right temporal lobe. This results in some mass effect with effacement of the sulci and right lateral ventricle but no hemorrhage. 2. Hyperdense right MCA extending beyond the bifurcation  compatible with thrombus. 3. Mild diffuse  subcortical white matter hypoattenuation bilaterally likely reflects microvascular ischemia. 4. ASPECTS is 4/10 The above was relayed via text pager to Dr. Nicholas Lose on 07/27/2017 at 20:40 . Electronically Signed   By: Marin Roberts M.D.   On: 07/27/2017 20:42   I reviewed this patient's CTA Neck, he has a sub-total occlusion of R ICA that appears to be surgically accessible.   Medical Decision Making   DEMETRY BENDICKSON is a 52 y.o. male who presents with: large R CVA with dense neuro deficit, R ICA sub-total occlusion, intracranial disease, cocaine use   No immediately surgical interventions likely to be helpful.  Would be worried that immediate ICA revascularization might lead to hemorrhagic transformation.  Maximal medical mgmt per Neurology.  Will check on patient periodically to see if any progression or resolution in stroke sx  Thank you for allowing [place] to participate in this patient's care.   Leonides Sake, MD, FACS [facility] Office: [phone] Pager: [phone]  07/28/2017, 5:28 PM

## 2017-07-28 NOTE — Progress Notes (Addendum)
PROGRESS NOTE    [name] [name]  EAV:[id number] DOB: 04-May-1965 DOA: 07/27/2017 PCP: Alysia Penna, MD   Brief Narrative:  [name] [name] is a 52 y.o. male with a known history of GERD, HTN, nephrolithiasis, tobacco dependence was in a usual state of health until this morning when he woke up at 7am with left sided weakness.  Was found by family who was unable to lift him into bed.  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  Thought to not be a good candidate for thrombectomy due to prolonged time from onset.   At baseline patient works for a [employer], functionally independent.  Admits to drinking a pint of whiskey daily.    Otherwise there has been no change in status. Patient has been taking medication as prescribed and there has been no recent change in medication or diet.  There has been no recent illness, travel or sick contacts.   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  Transfer care to PCCM as primary.  Discussed with ICU attending to confirm this.   1. R MCA ischemic infarct - Sleepy on my exam, which seems different from prior.  Awakens to voice and follows commands, but somnolent.  L sided weakness.  PERRL. - discussed with neurology, recommended Neuro ICU.  Notified ICU c/s attending.  Repeat stat head CT. - CTA head/neck with large R MCA territory infarct (see report), high grade critical stenosis of R internal carotid artery - Discussed possible thrombectomy by neuro, but too high risk for hemorrhage - at risk for malignant R hemisphere edema - neuro c/s, appreciate recs - PCCM c/s - MRI pending (per discussion with neuro PA, pt apparently had refused this last night?) -  A1c normal.  LDL 106, HDL 46 - add statin when able to take PO - rectal ASA until passes swallow - echo pending - PT/OT/SLP - permissive hypertension  2. Left foot pain - negative plain film   # Leukocytosis: no si/sx infection  # Elevated AST: mild, follow   # Smoking: cessation counseling when able  DVT prophylaxis: lovenox Code Status: full code Family Communication: none at bedside Disposition Plan: pending   Consultants:   Neurology  PCCM  Procedures:  none  Antimicrobials:  Anti-infectives (From admission, onward)   None      Subjective: L side gave out yesterday morning. No numbness tingling.  Sleepy now.  Objective: Vitals:   07/28/17 0700 07/28/17 0730 07/28/17 0812 07/28/17 0839  BP: (!) 132/102 (!) 135/92 (!) 149/83 136/83  Pulse:   87 86  Resp: (!) 22 (!) 21 (!) 22 16  Temp:    98.1 F (36.7 C)  TempSrc:    Oral  SpO2:   94% 99%  Weight:      Height:       No intake or output data in the 24 hours ending 07/28/17 0901 Filed Weights   07/27/17 2105  Weight: 76.3 kg (168 lb 3.2 oz)    Examination:  General exam: Appears calm and comfortable. Somnolent on exam. Respiratory system: Clear to auscultation. Respiratory effort normal. Cardiovascular system: S1 & S2 heard, RRR. No JVD, murmurs, rubs, gallops  or clicks. No pedal edema. Gastrointestinal system: Abdomen is nondistended, soft and nontender. No organomegaly or masses felt. Normal bowel sounds heard. Central nervous system: Sleepy, but awakens and appropriate.  L sided weakness 0/5.  5/5 strength on R.  Sensation intact.  L sided facial droop, tongue deviates to L.  Extremities: Symmetric 5 x 5 power. Skin: No rashes, lesions or ulcers Psychiatry: Judgement and insight appear normal. Mood & affect appropriate.     Data Reviewed: I have personally reviewed following labs and imaging studies  CBC: Recent Labs  Lab 07/27/17 2025 07/27/17 2036 07/28/17 0254  WBC 16.1*  --   14.1*  NEUTROABS 11.9*  --   --   HGB 18.0* 18.0* 18.3*  HCT 52.2* 53.0* 52.1*  MCV 97.9  --  99.0  PLT 254  --  234   Basic Metabolic Panel: Recent Labs  Lab 07/27/17 2025 07/27/17 2036 07/28/17 0254  NA 137 141  --   K 3.7 3.7  --   CL 102 103  --   CO2 25  --   --   GLUCOSE 120* 118*  --   BUN 11 12  --   CREATININE 1.22 1.00 1.12  CALCIUM 9.6  --   --    GFR: Estimated Creatinine Clearance: 74.4 mL/min (by C-G formula based on SCr of 1.12 mg/dL). Liver Function Tests: Recent Labs  Lab 07/27/17 2025  AST 51*  ALT 37  ALKPHOS 71  BILITOT 1.1  PROT 7.5  ALBUMIN 4.1   No results for input(s): LIPASE, AMYLASE in the last 168 hours. No results for input(s): AMMONIA in the last 168 hours. Coagulation Profile: Recent Labs  Lab 07/27/17 2025  INR 1.05   Cardiac Enzymes: Recent Labs  Lab 07/28/17 0254  TROPONINI 0.03*   BNP (last 3 results) No results for input(s): PROBNP in the last 8760 hours. HbA1C: No results for input(s): HGBA1C in the last 72 hours. CBG: Recent Labs  Lab 07/27/17 2105  GLUCAP 94   Lipid Profile: No results for input(s): CHOL, HDL, LDLCALC, TRIG, CHOLHDL, LDLDIRECT in the last 72 hours. Thyroid Function Tests: No results for input(s): TSH, T4TOTAL, FREET4, T3FREE, THYROIDAB in the last 72 hours. Anemia Panel: No results for input(s): VITAMINB12, FOLATE, FERRITIN, TIBC, IRON, RETICCTPCT in the last 72 hours. Sepsis Labs: No results for input(s): PROCALCITON, LATICACIDVEN in the last 168 hours.  No results found for this or any previous visit (from the past 240 hour(s)).       Radiology Studies: Ct Angio Head W Or Wo Contrast  Result Date: 07/27/2017 CLINICAL DATA:  Code stroke. Left-sided weakness. Left facial droop. EXAM: CT ANGIOGRAPHY HEAD AND NECK CT PERFUSION BRAIN TECHNIQUE: Multidetector CT imaging of the head and neck was performed using the standard protocol during bolus administration of intravenous contrast.  Multiplanar CT image reconstructions and MIPs were obtained to evaluate the vascular anatomy. Carotid stenosis measurements (when applicable) are obtained utilizing NASCET criteria, using the distal internal carotid diameter as the denominator. Multiphase CT imaging of the brain was performed following IV bolus contrast injection. Subsequent parametric perfusion maps were calculated using RAPID software. CONTRAST:  ISOVUE-370 IOPAMIDOL (ISOVUE-370) INJECTION 76% COMPARISON:  CT head without contrast from the same day. FINDINGS: CTA NECK FINDINGS Aortic arch: A 3 vessel arch configuration is present. Atherosclerotic calcifications are present at the origin of the left subclavian artery without a significant stenosis. There is no aneurysm. Right carotid system: Right common carotid artery is within normal  limits. There is a high-grade, near occlusive stenosis at the bifurcation. There is marked decrease in caliber of the right cervical ICA with near complete loss of contrast at the skull base. Left carotid system: Left common carotid artery demonstrates mural calcifications anteriorly. There is calcified and noncalcified plaque in the proximal left internal carotid artery. The lumen is narrowed to 2.7 mm approximately 1 cm from the bifurcation. This compares with a more normal distal measurement 4.8 mm. No tandem stenosis is present. Vertebral arteries: The right vertebral artery is the dominant vessel. Both vertebral arteries originate from the subclavian arteries. There is a moderate stenosis of the proximal right vertebral artery relative to the more distal vessel. There is no other significant stenosis of the vertebral arteries in the neck. Skeleton: Vertebral body heights and alignment are maintained. No focal lytic or blastic lesions are present. Other neck: Soft tissues the neck are otherwise unremarkable. Thyroid is within normal limits. There is no significant adenopathy. Salivary glands are normal.  Upper chest: Centrilobular emphysematous changes are present. No focal nodule or mass lesion is present. There is no airspace disease. Review of the MIP images confirms the above findings CTA HEAD FINDINGS Anterior circulation: A posterior communicating artery is present. There is some reconstitution of the cavernous right internal carotid artery. The petrous segment is markedly narrowed. There is significant narrowing of the supraclinoid right ICA. The right M1 segment is occluded. The right A1 segment is not visualized. Atherosclerotic calcifications are present within the cavernous left internal carotid artery without a significant stenosis through the ICA terminus. The A1 and M1 segments are patent. Anterior communicating artery is patent. Both A2 segments fill from the left. The left MCA bifurcation is intact. Left MCA branch vessels are normal. There is no significant reconstitution of right MCA branch vessels. Very poor collaterals are present. Posterior circulation: The right vertebral artery is the dominant vessel. PICA origins are visualized and normal. The basilar artery is small. The left posterior cerebral artery originates from the basilar tip. The fetal type right posterior cerebral artery is present. Proximal scratched at there is mild narrowing of the left P2 segment prior to the bifurcation. There is moderate attenuation of PCA branch vessels bilaterally. Venous sinuses: The dural sinuses are patent. Straight sinus and deep cerebral veins are intact. Cortical veins are unremarkable. Anatomic variants: Fetal type right posterior cerebral artery. Review of the MIP images confirms the above findings CT Brain Perfusion Findings: CBF (<30%) Volume: 69mL Perfusion (Tmax>6.0s) volume: Mismatch Volume: 64mL Infarction Location:Right MCA territory CBF < 30% confirms ASPECTS score of at least 4 and possibly 3. The hypoperfusion index is 0.6 consistent with very poor collaterals and a rapidly  progressive infarct. IMPRESSION: 1. Large right MCA territory infarct.  CBF< 30% is 69 mL. 2. Hypoperfusion index of 0.6 consistent with very poor collaterals and rapidly progressive infarct. 3. 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 4. Marked decreased caliber of the cervical right internal carotid artery distal to the stenosis. 5. Reconstitution of the right internal carotid artery at the ophthalmic segment with some filling of the fetal type right posterior cerebral artery. 6. Occluded right M1 segment with poor collaterals. 7. Atherosclerotic changes at the left carotid bifurcation without significant stenosis. 8. Left MCA and ACA vessels are within normal limits. 9. Small basilar artery feeds the left posterior cerebral artery. The above was relayed via text pager to Dr. Nicholas Lose on 07/27/2017 at 21:08 .  Electronically Signed   By: Marin Roberts M.D.   On: 07/27/2017 21:19   Dg Chest 2 View  Result Date: 07/28/2017 CLINICAL DATA:  TIA.  Patient is unable to move arm. EXAM: CHEST - 2 VIEW COMPARISON:  None. FINDINGS: Slightly shallow inspiration. Normal heart size and pulmonary vascularity. No focal airspace disease or consolidation in the lungs. No blunting of costophrenic angles. No pneumothorax. Mediastinal contours appear intact. Degenerative changes in the spine. IMPRESSION: No active cardiopulmonary disease. Electronically Signed   By: Burman Nieves M.D.   On: 07/28/2017 02:31   Ct Angio Neck W Or Wo Contrast  Result Date: 07/27/2017 CLINICAL DATA:  Code stroke. Left-sided weakness. Left facial droop. EXAM: CT ANGIOGRAPHY HEAD AND NECK CT PERFUSION BRAIN TECHNIQUE: Multidetector CT imaging of the head and neck was performed using the standard protocol during bolus administration of intravenous contrast. Multiplanar CT image reconstructions and MIPs were obtained to evaluate the vascular anatomy. Carotid stenosis  measurements (when applicable) are obtained utilizing NASCET criteria, using the distal internal carotid diameter as the denominator. Multiphase CT imaging of the brain was performed following IV bolus contrast injection. Subsequent parametric perfusion maps were calculated using RAPID software. CONTRAST:  ISOVUE-370 IOPAMIDOL (ISOVUE-370) INJECTION 76% COMPARISON:  CT head without contrast from the same day. FINDINGS: CTA NECK FINDINGS Aortic arch: A 3 vessel arch configuration is present. Atherosclerotic calcifications are present at the origin of the left subclavian artery without a significant stenosis. There is no aneurysm. Right carotid system: Right common carotid artery is within normal limits. There is a high-grade, near occlusive stenosis at the bifurcation. There is marked decrease in caliber of the right cervical ICA with near complete loss of contrast at the skull base. Left carotid system: Left common carotid artery demonstrates mural calcifications anteriorly. There is calcified and noncalcified plaque in the proximal left internal carotid artery. The lumen is narrowed to 2.7 mm approximately 1 cm from the bifurcation. This compares with a more normal distal measurement 4.8 mm. No tandem stenosis is present. Vertebral arteries: The right vertebral artery is the dominant vessel. Both vertebral arteries originate from the subclavian arteries. There is a moderate stenosis of the proximal right vertebral artery relative to the more distal vessel. There is no other significant stenosis of the vertebral arteries in the neck. Skeleton: Vertebral body heights and alignment are maintained. No focal lytic or blastic lesions are present. Other neck: Soft tissues the neck are otherwise unremarkable. Thyroid is within normal limits. There is no significant adenopathy. Salivary glands are normal. Upper chest: Centrilobular emphysematous changes are present. No focal nodule or mass lesion is present. There is  no airspace disease. Review of the MIP images confirms the above findings CTA HEAD FINDINGS Anterior circulation: A posterior communicating artery is present. There is some reconstitution of the cavernous right internal carotid artery. The petrous segment is markedly narrowed. There is significant narrowing of the supraclinoid right ICA. The right M1 segment is occluded. The right A1 segment is not visualized. Atherosclerotic calcifications are present within the cavernous left internal carotid artery without a significant stenosis through the ICA terminus. The A1 and M1 segments are patent. Anterior communicating artery is patent. Both A2 segments fill from the left. The left MCA bifurcation is intact. Left MCA branch vessels are normal. There is no significant reconstitution of right MCA branch vessels. Very poor collaterals are present. Posterior circulation: The right vertebral artery is the dominant vessel. PICA origins are visualized and normal. The basilar  artery is small. The left posterior cerebral artery originates from the basilar tip. The fetal type right posterior cerebral artery is present. Proximal scratched at there is mild narrowing of the left P2 segment prior to the bifurcation. There is moderate attenuation of PCA branch vessels bilaterally. Venous sinuses: The dural sinuses are patent. Straight sinus and deep cerebral veins are intact. Cortical veins are unremarkable. Anatomic variants: Fetal type right posterior cerebral artery. Review of the MIP images confirms the above findings CT Brain Perfusion Findings: CBF (<30%) Volume: 69mL Perfusion (Tmax>6.0s) volume: Mismatch Volume: 64mL Infarction Location:Right MCA territory CBF < 30% confirms ASPECTS score of at least 4 and possibly 3. The hypoperfusion index is 0.6 consistent with very poor collaterals and a rapidly progressive infarct. IMPRESSION: 1. Large right MCA territory infarct.  CBF< 30% is 69 mL. 2. Hypoperfusion index of 0.6  consistent with very poor collaterals and rapidly progressive infarct. 3. 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 4. Marked decreased caliber of the cervical right internal carotid artery distal to the stenosis. 5. Reconstitution of the right internal carotid artery at the ophthalmic segment with some filling of the fetal type right posterior cerebral artery. 6. Occluded right M1 segment with poor collaterals. 7. Atherosclerotic changes at the left carotid bifurcation without significant stenosis. 8. Left MCA and ACA vessels are within normal limits. 9. Small basilar artery feeds the left posterior cerebral artery. The above was relayed via text pager to Dr. Nicholas Lose on 07/27/2017 at 21:08 . Electronically Signed   By: Marin Roberts M.D.   On: 07/27/2017 21:19   Ct Cerebral Perfusion W Contrast  Result Date: 07/27/2017 CLINICAL DATA:  Code stroke. Left-sided weakness. Left facial droop. EXAM: CT ANGIOGRAPHY HEAD AND NECK CT PERFUSION BRAIN TECHNIQUE: Multidetector CT imaging of the head and neck was performed using the standard protocol during bolus administration of intravenous contrast. Multiplanar CT image reconstructions and MIPs were obtained to evaluate the vascular anatomy. Carotid stenosis measurements (when applicable) are obtained utilizing NASCET criteria, using the distal internal carotid diameter as the denominator. Multiphase CT imaging of the brain was performed following IV bolus contrast injection. Subsequent parametric perfusion maps were calculated using RAPID software. CONTRAST:  ISOVUE-370 IOPAMIDOL (ISOVUE-370) INJECTION 76% COMPARISON:  CT head without contrast from the same day. FINDINGS: CTA NECK FINDINGS Aortic arch: A 3 vessel arch configuration is present. Atherosclerotic calcifications are present at the origin of the left subclavian artery without a significant stenosis. There is no aneurysm. Right  carotid system: Right common carotid artery is within normal limits. There is a high-grade, near occlusive stenosis at the bifurcation. There is marked decrease in caliber of the right cervical ICA with near complete loss of contrast at the skull base. Left carotid system: Left common carotid artery demonstrates mural calcifications anteriorly. There is calcified and noncalcified plaque in the proximal left internal carotid artery. The lumen is narrowed to 2.7 mm approximately 1 cm from the bifurcation. This compares with a more normal distal measurement 4.8 mm. No tandem stenosis is present. Vertebral arteries: The right vertebral artery is the dominant vessel. Both vertebral arteries originate from the subclavian arteries. There is a moderate stenosis of the proximal right vertebral artery relative to the more distal vessel. There is no other significant stenosis of the vertebral arteries in the neck. Skeleton: Vertebral body heights and alignment are maintained. No focal lytic or blastic lesions are present. Other neck: Soft tissues  the neck are otherwise unremarkable. Thyroid is within normal limits. There is no significant adenopathy. Salivary glands are normal. Upper chest: Centrilobular emphysematous changes are present. No focal nodule or mass lesion is present. There is no airspace disease. Review of the MIP images confirms the above findings CTA HEAD FINDINGS Anterior circulation: A posterior communicating artery is present. There is some reconstitution of the cavernous right internal carotid artery. The petrous segment is markedly narrowed. There is significant narrowing of the supraclinoid right ICA. The right M1 segment is occluded. The right A1 segment is not visualized. Atherosclerotic calcifications are present within the cavernous left internal carotid artery without a significant stenosis through the ICA terminus. The A1 and M1 segments are patent. Anterior communicating artery is patent. Both A2  segments fill from the left. The left MCA bifurcation is intact. Left MCA branch vessels are normal. There is no significant reconstitution of right MCA branch vessels. Very poor collaterals are present. Posterior circulation: The right vertebral artery is the dominant vessel. PICA origins are visualized and normal. The basilar artery is small. The left posterior cerebral artery originates from the basilar tip. The fetal type right posterior cerebral artery is present. Proximal scratched at there is mild narrowing of the left P2 segment prior to the bifurcation. There is moderate attenuation of PCA branch vessels bilaterally. Venous sinuses: The dural sinuses are patent. Straight sinus and deep cerebral veins are intact. Cortical veins are unremarkable. Anatomic variants: Fetal type right posterior cerebral artery. Review of the MIP images confirms the above findings CT Brain Perfusion Findings: CBF (<30%) Volume: 69mL Perfusion (Tmax>6.0s) volume: Mismatch Volume: 64mL Infarction Location:Right MCA territory CBF < 30% confirms ASPECTS score of at least 4 and possibly 3. The hypoperfusion index is 0.6 consistent with very poor collaterals and a rapidly progressive infarct. IMPRESSION: 1. Large right MCA territory infarct.  CBF< 30% is 69 mL. 2. Hypoperfusion index of 0.6 consistent with very poor collaterals and rapidly progressive infarct. 3. 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 4. Marked decreased caliber of the cervical right internal carotid artery distal to the stenosis. 5. Reconstitution of the right internal carotid artery at the ophthalmic segment with some filling of the fetal type right posterior cerebral artery. 6. Occluded right M1 segment with poor collaterals. 7. Atherosclerotic changes at the left carotid bifurcation without significant stenosis. 8. Left MCA and ACA vessels are within normal limits. 9. Small basilar  artery feeds the left posterior cerebral artery. The above was relayed via text pager to Dr. Nicholas Lose on 07/27/2017 at 21:08 . Electronically Signed   By: Marin Roberts M.D.   On: 07/27/2017 21:19   Dg Foot 2 Views Left  Result Date: 07/27/2017 CLINICAL DATA:  Fall with foot pain EXAM: LEFT FOOT - 2 VIEW COMPARISON:  None. FINDINGS: There is no evidence of fracture or dislocation. There is no evidence of arthropathy or other focal bone abnormality. Soft tissues are unremarkable. IMPRESSION: Negative. Electronically Signed   By: Deatra Robinson M.D.   On: 07/27/2017 22:56   Ct Head Code Stroke Wo Contrast  Result Date: 07/27/2017 CLINICAL DATA:  Code stroke. Acute onset of left-sided weakness and facial droop beginning at 7 a.m. today, 13.5 hours ago. EXAM: CT HEAD WITHOUT CONTRAST TECHNIQUE: Contiguous axial images were obtained from the base of the skull through the vertex without intravenous contrast. COMPARISON:  None. FINDINGS: Brain: There is diffuse loss of gray-white differentiation in the  right temporal lobe. There is hypoattenuation along the right insular cortex and the right lentiform nucleus. Hypoattenuation involves the right caudate head and anterior limb internal capsule. Subcortical white matter hypoattenuation is present in the super ganglionic region without additional cortical lesions. There is partial effacement of the right lateral ventricle and sulci on the right. The brainstem and cerebellum are within normal limits. Vascular: Atherosclerotic changes are present within the cavernous internal carotid arteries bilaterally. A hyperdense right MCA. Hyperdense vessels are seen beyond bifurcation. Skull: Calvarium is intact. No focal lytic or blastic lesions are present. Sinuses/Orbits: Mild mucosal thickening is present in the left maxillary sinus. There is opacification of a single right ethmoid air cell. No fluid levels are present. Globes and orbits are within normal limits. ASPECTS  [facility] Stroke Program Early CT Score) - Ganglionic level infarction (caudate, lentiform nuclei, internal capsule, insula, M1-M3 cortex): 1/7 - Supraganglionic infarction (M4-M6 cortex): 3/3 Total score (0-10 with 10 being normal): 4/7 IMPRESSION: 1. Large right MCA territory infarct involving the insular cortex, lentiform nucleus, anterior limb internal capsule, caudate head, and right temporal lobe. This results in some mass effect with effacement of the sulci and right lateral ventricle but no hemorrhage. 2. Hyperdense right MCA extending beyond the bifurcation compatible with thrombus. 3. Mild diffuse subcortical white matter hypoattenuation bilaterally likely reflects microvascular ischemia. 4. ASPECTS is 4/10 The above was relayed via text pager to Dr. Nicholas Lose on 07/27/2017 at 20:40 . Electronically Signed   By: Marin Roberts M.D.   On: 07/27/2017 20:42        Scheduled Meds: .  stroke: mapping our early stages of recovery book   Does not apply Once  . aspirin  300 mg Rectal Daily   Or  . aspirin  325 mg Oral Daily  . enoxaparin (LOVENOX) injection  40 mg Subcutaneous Daily   Continuous Infusions:   LOS: 1 day    Time spent: 35 min of critical care time.  Pt critically ill with large MCA territory stroke with change in exam.     Lacretia Nicks, MD [facility] Pager [phone]  If 7PM-7AM, please contact night-coverage [email or web address] Password [email or web address] 07/28/2017, 9:01 AM

## 2017-07-28 NOTE — ED Notes (Addendum)
Assumed care on pt. , pt. sleeping with no distress/respiratioins unlabored , IV sites intact , friend at bedside .

## 2017-07-28 NOTE — Significant Event (Signed)
Rapid Response Event Note  Overview: Time Called: 0948 Arrival Time: 0952 Event Type: Neurologic  Initial Focused Assessment: Patient admitted last night with Large Right MCA CVA.  No TPA bc outside window, no IR.  Arrived to 3w this am about 0830. 0900 Dr Lowell Guitar at bedside,  ICU transfer order and stat CT orders  Upon my arrival about 0955:  Patient alert and engaged for NIHSS assessment Otherwise he lies sleeping and awakes quickly when stimulated.  Interventions: NIHSS 14 Incorrect month, gaze does not cross midline, Left visual field cut, moderate facial palsy,  Left arm without movement, able to lift left leg but falls to bed quickly,  Mild dysarthria, mild neglect  Stat head CT done  Transferred to 4n31  Plan of Care (if not transferred):  Event Summary: Name of Physician Notified: Dr Lowell Guitar at bedside prior to my arrival at    Name of Consulting Physician Notified: Dr Minette Headland CCM  and Stroke Team at    Outcome: Transferred (Comment)  Event End Time: 1145  [name] [name]

## 2017-07-28 NOTE — ED Notes (Signed)
Pt to MRI via stretcher.

## 2017-07-28 NOTE — ED Notes (Signed)
Admitting MD notified on pt.'s elevated Troponin result.  

## 2017-07-28 NOTE — ED Notes (Signed)
Per Dr. Emmit Pomfret, pt will go to MRI again in the morning due to agitation at this time during MRI.

## 2017-07-28 NOTE — ED Notes (Signed)
Patient transported to X-ray 

## 2017-07-28 NOTE — Progress Notes (Signed)
STROKE TEAM PROGRESS NOTE   HISTORY OF PRESENT ILLNESS (per record) [name] [name] is an 52 y.o. male who woke up and noted left sided hemiplegia acutely at 7 am.  Not found until 8:30 am.  Refused to come to the ER until 8:30 pm.  CT Brain no blood, but hypodensity right MCA with ASPECTS 3-4.  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  RAPID shows core infarct 69 ml and penumbra 133 ml with ratio 1.9.  NIHSS 13.  He has a history of hypertension but not fully compliant.  No antiplatelet therapy at home.     SUBJECTIVE (INTERVAL HISTORY) His family is not at bedside. 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     OBJECTIVE Temp:  [97.9 F (36.6 C)-100 F (37.8 C)] 98.1 F (36.7 C) (05/26 0839) Pulse Rate:  [68-109] 86 (05/26 0839) Resp:  [16-32] 16 (05/26 0839) BP: (111-179)/(63-104) 136/83 (05/26 0839) SpO2:  [87 %-99 %] 99 % (05/26 0839) Weight:  [168 lb 3.2 oz (76.3 kg)] 168 lb 3.2 oz (76.3 kg) (05/25 2105)  CBC:  Recent Labs  Lab 07/27/17 2025 07/27/17 2036 07/28/17 0254  WBC 16.1*  --  14.1*  NEUTROABS 11.9*  --   --   HGB 18.0* 18.0* 18.3*  HCT 52.2* 53.0* 52.1*  MCV 97.9  --  99.0  PLT 254  --  234    Basic Metabolic Panel:  Recent Labs  Lab 07/27/17 2025 07/27/17 2036 07/28/17 0254  NA 137 141  --   K 3.7 3.7  --   CL 102 103  --   CO2 25  --   --   GLUCOSE 120* 118*  --   BUN 11 12  --   CREATININE 1.22 1.00 1.12  CALCIUM 9.6  --   --     Lipid Panel: No results found for: CHOL, TRIG, HDL, CHOLHDL, VLDL, LDLCALC HgbA1c: No results found for: HGBA1C Urine Drug Screen: No results found for: LABOPIA, COCAINSCRNUR, LABBENZ, AMPHETMU, THCU, LABBARB  Alcohol Level No results found for: ETH  IMAGING  Ct Angio Head W Or Wo Contrast Ct Angio Neck W Or Wo Contrast Ct Cerebral Perfusion W Contrast 07/27/2017 IMPRESSION:  1. Large right MCA territory infarct.  CBF< 30% is 69 mL.  2.  Hypoperfusion index of 0.6 consistent with very poor collaterals and rapidly progressive infarct.  3. 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  4. Marked decreased caliber of the cervical right internal carotid artery distal to the stenosis.  5. Reconstitution of the right internal carotid artery at the ophthalmic segment with some filling of the fetal type right posterior cerebral artery.  6. Occluded right M1 segment with poor collaterals.  7. Atherosclerotic changes at the left carotid bifurcation without significant stenosis.  8. Left MCA and ACA vessels are within normal limits.  9. Small basilar artery feeds the left posterior cerebral artery.    Dg Chest 2 View 07/28/2017 IMPRESSION:  No active cardiopulmonary disease.    Dg Foot 2 Views Left 07/27/2017 IMPRESSION:  Negative.    Ct Head Code Stroke Wo Contrast 07/27/2017 IMPRESSION:  1. Large right MCA territory infarct involving the insular cortex, lentiform nucleus, anterior limb internal capsule, caudate head, and right temporal lobe. This results in some mass effect with effacement of the sulci and right lateral ventricle but no hemorrhage.  2. Hyperdense right  MCA extending beyond the bifurcation compatible with thrombus.  3. Mild diffuse subcortical white matter hypoattenuation bilaterally likely reflects microvascular ischemia.  4. ASPECTS is 4/10     Transthoracic Echocardiogram - pending 00/00/00    PHYSICAL EXAM Vitals:   07/28/17 0700 07/28/17 0730 07/28/17 0812 07/28/17 0839  BP: (!) 132/102 (!) 135/92 (!) 149/83 136/83  Pulse:   87 86  Resp: (!) 22 (!) 21 (!) 22 16  Temp:    98.1 F (36.7 C)  TempSrc:    Oral  SpO2:   94% 99%  Weight:      Height:        General -  Heart - Regular rate and rhythm - no murmer appreciated Lungs - Clear to auscultation anteriorly Abdomen - Soft - non tender Extremities - Distal pulses intact -  no edema Skin - Warm and dry  Mental Status: Alert, says it is may 1900  Speech fluent without evidence of aphasia.  Able to follow simple commands Cranial Nerves: II: Discs not visualized; hemianopia III,IV, VI: left ptosis, right EOMI, left eye with elevation and esotropia, PERRL V,VII: dense left facial droop with decreased sensation left VIII: hearing normal to voice IX,X: cough and gag intact, protecting his own airway XI: bilateral shoulder shrug intact. XII: tongue deviated Motor: Dense left arm weakness, withdraws left LE Decreased tone on the left Sensory: decreased sensation on the left Plantars: Right: downgoing   Left: upgoing Cerebellar: normal finger-to-nose right, unable on the left due to weakness Gait: not tested      HOME MEDICATIONS:     ASSESSMENT/PLAN [name] is a 52 y.o. male with history of tobacco use, hypertension,and medical noncompliance presenting with left hemiplegia. He did not receive IV t-PA due to unknown time of onset.  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  MRA head - not ordered  CTA H&N - High-grade, critical, stenosis Rt ICA.  Rt MCA compatible with thrombus.   Carotid Doppler - CTA neck  2D Echo - pending  LDL - pending  HgbA1c - - pending  VTE prophylaxis - Lovenox / SCDs Diet Order           Diet NPO time specified  Diet effective now          No antithrombotic prior to admission, now on aspirin 325 mg daily  Patient counseled to be compliant with his antithrombotic medications  Ongoing aggressive stroke risk factor management  Therapy recommendations:   pending  Disposition:  Pending  Hypertension  Stable . Permissive hypertension (OK if < 220/120) but gradually normalize in 5-7 days . Long-term BP goal normotensive  Hyperlipidemia  Lipid lowering medication PTA:  none  LDL pending, goal < 70  Current lipid lowering medication: will add Lipitor 40 mg daily  Continue statin at discharge   Other Stroke Risk Factors  Cigarette smoker - advised to stop smoking  ETOH use, advised to drink no more than 1 alcoholic beverage per day.  Overweight, Body mass index is 27.99 kg/m., recommend weight loss, diet and exercise as appropriate   Family hx stroke (grandmother had stroke in her 82s)   Other  Active Problems  Pancytosis  Combativeness - check UDS (hx of marijuana use)   Plan / Recommendations   Stroke workup: awaiting - MRI ; UDS ; LDL ; HGBA1C : stat repeat Head CT ; 2-D echo  Therapy Follow Up: pending  Disposition: pending  Antiplatelet / Anticoagulation: ASA 325 mg daily  (consider DAPT in future - hold for now 2/2 large stroke))  Statin: Lipitor 40 mg daily (may need to increase - LDL pending)  MD Follow Up: [facility] in 6-8 weeks  Other: consults include - CCM ; NSY; Vascular surgery for carotid stenosis  Transfer to NICU ASAP  Further risk factor modification per primary care MD: Follow Up 2 weeks  This patient is critically ill and at significant risk of neurological worsening, death and care requires constant monitoring of vital signs, hemodynamics,respiratory and cardiac monitoring,review of multiple databases, neurological assessment, discussion with family, other specialists and medical decision making of high complexity.I  I spent 30 minutes of neurocritical care time in the care of this patient.  Naomie Dean, MD [facility]  To contact Stroke Continuity provider, please refer to [email or web address]. After hours, contact General Neurology

## 2017-07-28 NOTE — Evaluation (Addendum)
SLP Cancellation Note  Patient Details Name: [name] [name] MRN: [id number] DOB: May 29, 1965   Cancelled treatment:       Reason Eval/Treat Not Completed: Other (comment);Medical issues which prohibited therapy(note change in mental status today and plan to transfer to ICU, will continue efforts for speech/language evaluation)   Chales Abrahams 07/28/2017, 10:54 AM  Donavan Burnet, MS [facility] SLP [phone]

## 2017-07-28 NOTE — Progress Notes (Signed)
Pt return from STAT CT of head.  Will continue to monitor.

## 2017-07-29 ENCOUNTER — Inpatient Hospital Stay (HOSPITAL_COMMUNITY): Payer: Managed Care, Other (non HMO)

## 2017-07-29 ENCOUNTER — Other Ambulatory Visit: Payer: Self-pay

## 2017-07-29 ENCOUNTER — Encounter (HOSPITAL_COMMUNITY): Payer: Managed Care, Other (non HMO)

## 2017-07-29 DIAGNOSIS — I63511 Cerebral infarction due to unspecified occlusion or stenosis of right middle cerebral artery: Secondary | ICD-10-CM

## 2017-07-29 LAB — COMPREHENSIVE METABOLIC PANEL
ALT: 33 U/L (ref 17–63)
AST: 34 U/L (ref 15–41)
Albumin: 3.3 g/dL — ABNORMAL LOW (ref 3.5–5.0)
Alkaline Phosphatase: 52 U/L (ref 38–126)
Anion gap: 7 (ref 5–15)
BILIRUBIN TOTAL: 1 mg/dL (ref 0.3–1.2)
BUN: 11 mg/dL (ref 6–20)
CHLORIDE: 116 mmol/L — AB (ref 101–111)
CO2: 23 mmol/L (ref 22–32)
CREATININE: 0.97 mg/dL (ref 0.61–1.24)
Calcium: 8.1 mg/dL — ABNORMAL LOW (ref 8.9–10.3)
Glucose, Bld: 108 mg/dL — ABNORMAL HIGH (ref 65–99)
Potassium: 3.9 mmol/L (ref 3.5–5.1)
Sodium: 146 mmol/L — ABNORMAL HIGH (ref 135–145)
TOTAL PROTEIN: 6.1 g/dL — AB (ref 6.5–8.1)

## 2017-07-29 LAB — CBC
HEMATOCRIT: 47.7 % (ref 39.0–52.0)
Hemoglobin: 16.1 g/dL (ref 13.0–17.0)
MCH: 34.2 pg — AB (ref 26.0–34.0)
MCHC: 33.8 g/dL (ref 30.0–36.0)
MCV: 101.3 fL — AB (ref 78.0–100.0)
PLATELETS: 205 10*3/uL (ref 150–400)
RBC: 4.71 MIL/uL (ref 4.22–5.81)
RDW: 12.2 % (ref 11.5–15.5)
WBC: 11.7 10*3/uL — AB (ref 4.0–10.5)

## 2017-07-29 LAB — SODIUM
SODIUM: 147 mmol/L — AB (ref 135–145)
Sodium: 144 mmol/L (ref 135–145)
Sodium: 146 mmol/L — ABNORMAL HIGH (ref 135–145)

## 2017-07-29 LAB — MAGNESIUM: MAGNESIUM: 2.2 mg/dL (ref 1.7–2.4)

## 2017-07-29 LAB — TROPONIN I: Troponin I: <0.03 ng/mL (ref <0.03)

## 2017-07-29 MED ORDER — SODIUM CHLORIDE 3 % IV SOLN
INTRAVENOUS | Status: DC
  Administered 2017-07-29 – 2017-07-30 (×3): 75 mL/h via INTRAVENOUS
  Administered 2017-07-30 (×2): 85 mL/h via INTRAVENOUS
  Administered 2017-07-31: 42.5 mL/h via INTRAVENOUS
  Administered 2017-07-31: 85 mL/h via INTRAVENOUS
  Administered 2017-08-01: 42.5 mL/h via INTRAVENOUS
  Administered 2017-08-01: 21.2 mL/h via INTRAVENOUS
  Filled 2017-07-29 (×20): qty 500

## 2017-07-29 NOTE — Progress Notes (Signed)
Rehab Admissions Coordinator Note:  Patient was screened by [name] for appropriateness for an Inpatient Acute Rehab Consult per OT recommendation.   At this time, we are recommending await further progress medically and with therapy before requesting an inpt rehab consult.. I will follow.   [name] 07/29/2017, 3:53 PM  I can be reached at [phone].

## 2017-07-29 NOTE — Evaluation (Signed)
Physical Therapy Evaluation Patient Details Name: [name] [name] MRN: [id number] DOB: May 12, 1965 Today's Date: 07/29/2017   History of Present Illness  52 yo male with PMH significant for HTN, tobacoo abuse admitted with R MCA infarct  Clinical Impression  Pt admitted with/for s/s of stroke with L sided weakness.  Pt not fully aware of his deficits and limitations at this time and needs at least min assist for basic mobility..  Pt currently limited functionally due to the problems listed. ( See problems list.)   Pt will benefit from PT to maximize function and safety in order to get ready for next venue listed below.     Follow Up Recommendations CIR    Equipment Recommendations  Other (comment)(TBA)    Recommendations for Other Services Rehab consult     Precautions / Restrictions Precautions Precautions: Fall Precaution Comments: lacks awarness to L side      Mobility  Bed Mobility Overal bed mobility: Needs Assistance Bed Mobility: Sit to Supine;Supine to Sit     Supine to sit: Min assist;+2 for physical assistance;+2 for safety/equipment;HOB elevated Sit to supine: Mod assist   General bed mobility comments: pt able to bridge buttock with support at bil ankles. pt exiting on the right side and sitting on L UE. pt no awareness. pt seems unaware of the risk for this . pt educated on L UE positioning and safety. Pt sittin gEOB supervision at this time  Transfers Overall transfer level: Needs assistance   Transfers: Sit to/from Stand;Lateral/Scoot Transfers Sit to Stand: +2 physical assistance;Min assist        Lateral/Scoot Transfers: Min assist;+2 safety/equipment General transfer comment: L LE initiated and holding patient up for sheet change.  scoot transfer up to [facility] with min forward assist.  Ambulation/Gait                Stairs            Wheelchair Mobility    Modified Rankin (Stroke Patients Only)       Balance Overall balance  assessment: Needs assistance Sitting-balance support: No upper extremity supported;Feet supported Sitting balance-Leahy Scale: Good     Standing balance support: Bilateral upper extremity supported;During functional activity Standing balance-Leahy Scale: Poor                               Pertinent Vitals/Pain Pain Assessment: No/denies pain    Home Living Family/patient expects to be discharged to:: Private residence Living Arrangements: Alone Available Help at Discharge: Family;Available PRN/intermittently Type of Home: Mobile home Home Access: Stairs to enter   Entrance Stairs-Number of Steps: ALOT ( HOUSE IS ON A HILL PER PATIENT) Home Layout: One level Home Equipment: None Additional Comments: 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 sister providing details of home setup .  has two sisters and  joan brown is a neighbor that helps take care of his mobile home when he is away for work    Prior Function Level of Independence: Independent               Hand Dominance   Dominant Hand: Right    Extremity/Trunk Assessment   Upper Extremity Assessment Upper Extremity Assessment: Defer to OT evaluation    Lower Extremity Assessment Lower Extremity Assessment: LLE deficits/detail LLE Deficits / Details: Initially no voluntary movement.  On standing, pt able to w/bear through the left LE and  show some control of locking and unlocking the knee in w/bearing. LLE Coordination: decreased fine motor    Cervical / Trunk Assessment Cervical / Trunk Assessment: Normal  Communication   Communication: No difficulties  Cognition Arousal/Alertness: Awake/alert Behavior During Therapy: WFL for tasks assessed/performed Overall Cognitive Status: Impaired/Different from baseline Area of Impairment: Awareness;Problem solving;Attention                   Current Attention Level: (inattention to the L )        Awareness: Intellectual Problem Solving: Slow processing;Requires verbal cues General Comments: pt reports only deficits is his L LE. pt however initiates with L LE and not L UE but lacks awareness to L UE deficits. pt rolling onto L UE and no awareness to safety concern for L UE      General Comments      Exercises     Assessment/Plan    PT Assessment Patient needs continued PT services  PT Problem List Decreased strength;Decreased balance;Decreased mobility;Decreased coordination;Decreased safety awareness       PT Treatment Interventions      PT Goals (Current goals can be found in the Care Plan section)  Acute Rehab PT Goals Patient Stated Goal: wants his sister to find him a lady PT Goal Formulation: With patient Time For Goal Achievement: 08/12/17 Potential to Achieve Goals: Good    Frequency Min 4X/week   Barriers to discharge        Co-evaluation PT/OT/SLP Co-Evaluation/Treatment: Yes   PT goals addressed during session: Mobility/safety with mobility         AM-PAC PT "6 Clicks" Daily Activity  Outcome Measure Difficulty turning over in bed (including adjusting bedclothes, sheets and blankets)?: Unable Difficulty moving from lying on back to sitting on the side of the bed? : Unable Difficulty sitting down on and standing up from a chair with arms (e.g., wheelchair, bedside commode, etc,.)?: Unable Help needed moving to and from a bed to chair (including a wheelchair)?: A Lot Help needed walking in hospital room?: A Lot Help needed climbing 3-5 steps with a railing? : Total 6 Click Score: 8    End of Session   Activity Tolerance: Patient tolerated treatment well Patient left: in bed;with call bell/phone within reach;with family/visitor present Nurse Communication: Mobility status PT Visit Diagnosis: Unsteadiness on feet (R26.81);Other abnormalities of gait and mobility (R26.89);Hemiplegia and hemiparesis Hemiplegia - Right/Left: Left Hemiplegia -  dominant/non-dominant: Non-dominant Hemiplegia - caused by: Cerebral infarction    Time: 1610-9604 PT Time Calculation (min) (ACUTE ONLY): 29 min   Charges:   PT Evaluation $PT Eval Moderate Complexity: 1 Mod     PT G Codes:        August 17, 2017  [place] Bing, PT [phone]  (pager)  Eliseo Gum Lupie Sawa 17-Aug-2017, 6:15 PM

## 2017-07-29 NOTE — Procedures (Signed)
Central venous catheter placement  Indication: Administration of hypertonic saline in the setting of a large MCA territory infarct  Procedure: After explaining the risks and benefits to the patient and the patient indicating his understanding and consent a timeout was performed.  The area over the left subclavian was extensively prepped with chlorhexidine and then widely draped.  Sterile garb was donned and the left subclavian vein easily cannulated after anesthetizing the skin with 1% lidocaine.  A wire was gently passed through the needle and then the skin was sharply incised the dilator passed.  A 7 French 20 cm triple-lumen catheter was easily placed to 20 cm.  There was good flow from all ports.  The catheter was sutured in place and a sterile dressing applied there was no immediate complication.  Chest x-ray for line placement is pending

## 2017-07-29 NOTE — Evaluation (Signed)
Occupational Therapy Evaluation Patient Details Name: [name] [name] MRN: [id number] DOB: 13-Apr-1965 Today's Date: 07/29/2017    History of Present Illness 52 yo male with R MCA infarct  Past Medical History:  Diagnosis Date  . GERD (gastroesophageal reflux disease)   . Hypertension   . Nephrolithiasis   . Tobacco abuse       Clinical Impression   PT admitted with R MCA. Pt currently with functional limitiations due to the deficits listed below (see OT problem list). Pt currently with L side affect and lacks awareness to deficits. Pt with cognitive deficits noted and L inattention.  Pt will benefit from skilled OT to increase their independence and safety with adls and balance to allow discharge CIR.     Follow Up Recommendations  CIR    Equipment Recommendations  Other (comment)(tba)    Recommendations for Other Services Rehab consult     Precautions / Restrictions Precautions Precautions: Fall Precaution Comments: lacks awarness to L side      Mobility Bed Mobility Overal bed mobility: Needs Assistance Bed Mobility: Sit to Supine;Supine to Sit     Supine to sit: Min assist;+2 for physical assistance;+2 for safety/equipment;HOB elevated Sit to supine: Mod assist   General bed mobility comments: pt able to bridge buttock with support at bil ankles. pt exiting on the right side and sitting on L UE. pt no awareness. pt seems unaware of the risk for this . pt educated on L UE positioning and safety. Pt sittin gEOB supervision at this time  Transfers Overall transfer level: Needs assistance   Transfers: Sit to/from Stand Sit to Stand: +2 physical assistance;Min assist         General transfer comment: L LE initiated and holding patient up for sheet change    Balance Overall balance assessment: Needs assistance Sitting-balance support: No upper extremity supported;Feet supported Sitting balance-Leahy Scale: Good     Standing balance support: Bilateral upper  extremity supported;During functional activity Standing balance-Leahy Scale: Fair                             ADL either performed or assessed with clinical judgement   ADL Overall ADL's : Needs assistance/impaired Eating/Feeding: NPO Eating/Feeding Details (indicate cue type and reason): able to swallow secretions noted during session Grooming: Set up               Lower Body Dressing: Moderate assistance Lower Body Dressing Details (indicate cue type and reason): pt able to reaching with R UE toward feet in supine but requires sock hooked on R LE toes and (A) to cross LLE in figure four position.                General ADL Comments: pt engaged and following commands. pt with some inattention to the L and scanning R more. pt able to track to the L but does not hold gaze to the L .      Vision Baseline Vision/History: Wears glasses Wears Glasses: Reading only       Perception Perception Perception Tested?: Yes Perception Deficits: Inattention/neglect Inattention/Neglect: Does not attend to left side of body;Does not attend to left visual field   Praxis      Pertinent Vitals/Pain Pain Assessment: No/denies pain     Hand Dominance Right   Extremity/Trunk Assessment Upper Extremity Assessment Upper Extremity Assessment: LUE deficits/detail LUE Deficits / Details: brunstrom I flaccid. pt with sensation to pain LUE  Sensation: decreased light touch;decreased proprioception LUE Coordination: decreased fine motor;decreased gross motor   Lower Extremity Assessment Lower Extremity Assessment: Defer to PT evaluation   Cervical / Trunk Assessment Cervical / Trunk Assessment: Normal   Communication Communication Communication: No difficulties   Cognition Arousal/Alertness: Awake/alert Behavior During Therapy: WFL for tasks assessed/performed Overall Cognitive Status: Impaired/Different from baseline Area of Impairment: Awareness;Problem  solving;Attention                   Current Attention Level: (in attention to the L )       Awareness: Intellectual Problem Solving: Slow processing;Requires verbal cues General Comments: pt reports only deficits is his L LE. pt however initiates with L LE and not L UE but lacks awareness to L UE deficits. 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 sister providing details of home setup .  has two sisters and  joan brown is a neighbor that helps take care of his mobile home when he is away for work      Prior Functioning/Environment Level of Independence: Independent                 OT Problem List: Decreased strength;Decreased range of motion;Decreased activity tolerance;Impaired balance (sitting and/or standing);Impaired vision/perception;Decreased coordination;Decreased cognition;Decreased safety awareness;Decreased knowledge of use of DME or AE;Decreased knowledge of precautions;Impaired sensation;Impaired UE functional use;Pain      OT Treatment/Interventions: Self-care/ADL training;Therapeutic exercise;Neuromuscular education;Energy conservation;DME and/or AE instruction;Manual therapy;Modalities;Splinting;Therapeutic activities;Cognitive remediation/compensation;Visual/perceptual remediation/compensation;Patient/family education;Balance training     OT Goals(Current goals can be found in the care plan section) Acute Rehab OT Goals Patient Stated Goal: wants his sister to find him a lady OT Goal Formulation: With patient Time For Goal Achievement: 08/12/17 Potential to Achieve Goals: Good  OT Frequency: Min 3X/week   Barriers to D/C: Decreased caregiver support          Co-evaluation PT/OT/SLP Co-Evaluation/Treatment: Yes Reason for Co-Treatment: Complexity of the patient's impairments (multi-system involvement);Necessary to address cognition/behavior during functional activity;To address functional/ADL transfers;For patient/therapist safety   OT goals addressed during session: ADL's and self-care;Proper use of Adaptive equipment and DME;Strengthening/ROM      AM-PAC PT "6 Clicks" Daily Activity     Outcome Measure Help from another person eating meals?: A Little Help from another person taking care of personal grooming?: A Little Help from another person toileting, which includes using toliet, bedpan, or urinal?: A Lot Help from another person bathing (including washing, rinsing, drying)?: A Lot Help from another person to put on and taking off regular upper body clothing?: A Lot Help from another person to put on and taking off regular lower body clothing?: A Lot 6 Click Score: 14   End of Session Nurse Communication: Mobility status;Precautions  Activity Tolerance: Patient tolerated treatment well Patient left: in bed;with call bell/phone within reach;with nursing/sitter  in room;with bed alarm set;with family/visitor present;with SCD's reapplied  OT Visit Diagnosis: Unsteadiness on feet (R26.81);Muscle weakness (generalized) (M62.81);Other symptoms and signs involving cognitive function;Hemiplegia and hemiparesis Hemiplegia - Right/Left: Left Hemiplegia - dominant/non-dominant: Non-Dominant Hemiplegia - caused by: Cerebral infarction                Time: 1610-9604 OT Time Calculation (min): 33 min Charges:  OT  General Charges $OT Visit: 1 Visit OT Evaluation $OT Eval Moderate Complexity: 1 Mod G-Codes:      Mateo Flow   OTR/L Pager: [phone] Office: [phone] .   Boone Master B 07/29/2017, 3:45 PM

## 2017-07-29 NOTE — Progress Notes (Signed)
STROKE TEAM PROGRESS NOTE   HISTORY OF PRESENT ILLNESS (per record) [name] [name] is an 52 y.o. male who woke up and noted left sided hemiplegia acutely at 7 am.  Not found until 8:30 am.  Refused to come to the ER until 8:30 pm.  CT Brain no blood, but hypodensity right MCA with ASPECTS 3-4.  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  RAPID shows core infarct 69 ml and penumbra 133 ml with ratio 1.9.  NIHSS 13.  He has a history of hypertension but not fully compliant.  No antiplatelet therapy at home.     SUBJECTIVE (INTERVAL HISTORY) No family members present. The patient is alert and follows commands. He is plegic on the left. Per Dr. Pearlean Brownie begin bedside PT and OT. Initiate diet.neurosurery feels no hemicraniectomy for now     OBJECTIVE Temp:  [98.5 F (36.9 C)-98.7 F (37.1 C)] 98.5 F (36.9 C) (05/27 0800) Pulse Rate:  [71-98] 84 (05/27 1500) Cardiac Rhythm: Normal sinus rhythm (05/27 0800) Resp:  [13-26] 20 (05/27 1500) BP: (119-169)/(69-132) 168/100 (05/27 1300) SpO2:  [89 %-100 %] 98 % (05/27 1500)  CBC:  Recent Labs  Lab 07/27/17 2025  07/28/17 0254 07/29/17 0611  WBC 16.1*  --  14.1* 11.7*  NEUTROABS 11.9*  --   --   --   HGB 18.0*   < > 18.3* 16.1  HCT 52.2*   < > 52.1* 47.7  MCV 97.9  --  99.0 101.3*  PLT 254  --  234 205   < > = values in this interval not displayed.    Basic Metabolic Panel:  Recent Labs  Lab 07/27/17 2025 07/27/17 2036 07/28/17 0254  07/29/17 0611 07/29/17 1130  NA 137 141  --    < > 146* 146*  K 3.7 3.7  --   --  3.9  --   CL 102 103  --   --  116*  --   CO2 25  --   --   --  23  --   GLUCOSE 120* 118*  --   --  108*  --   BUN 11 12  --   --  11  --   CREATININE 1.22 1.00 1.12  --  0.97  --   CALCIUM 9.6  --   --   --  8.1*  --   MG  --   --   --   --  2.2  --    < > = values in this interval not displayed.    Lipid Panel:     Component Value Date/Time   CHOL 172 07/28/2017 0848   TRIG 99 07/28/2017 0848    HDL 46 07/28/2017 0848   CHOLHDL 3.7 07/28/2017 0848   VLDL 20 07/28/2017 0848   LDLCALC 106 (H) 07/28/2017 0848   HgbA1c:  Lab Results  Component Value Date   HGBA1C 5.0 07/28/2017   Urine Drug Screen:     Component Value Date/Time   LABOPIA NONE DETECTED 07/28/2017 1700   COCAINSCRNUR POSITIVE (A) 07/28/2017 1700   LABBENZ NONE DETECTED 07/28/2017 1700   AMPHETMU NONE DETECTED 07/28/2017 1700   THCU NONE DETECTED 07/28/2017 1700   LABBARB NONE DETECTED 07/28/2017 1700    Alcohol Level No results found for: ETH  IMAGING  Ct Angio Head W Or Wo Contrast Ct Angio Neck W Or Wo Contrast Ct Cerebral Perfusion W Contrast 07/27/2017 IMPRESSION:  1. Large right MCA territory infarct.  CBF< 30% is 69 mL.  2. Hypoperfusion index of 0.6 consistent with very poor collaterals and rapidly progressive infarct.  3. 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  4. Marked decreased caliber of the cervical right internal carotid artery distal to the stenosis.  5. Reconstitution of the right internal carotid artery at the ophthalmic segment with some filling of the fetal type right posterior cerebral artery.  6. Occluded right M1 segment with poor collaterals.  7. Atherosclerotic changes at the left carotid bifurcation without significant stenosis.  8. Left MCA and ACA vessels are within normal limits.  9. Small basilar artery feeds the left posterior cerebral artery.    Dg Chest 2 View 07/28/2017 IMPRESSION:  No active cardiopulmonary disease.    Dg Foot 2 Views Left 07/27/2017 IMPRESSION:  Negative.    Ct Head Code Stroke Wo Contrast 07/27/2017 IMPRESSION:  1. Large right MCA territory infarct involving the insular cortex, lentiform nucleus, anterior limb internal capsule, caudate head, and right temporal lobe. This results in some mass effect with effacement of the sulci and right lateral ventricle but no hemorrhage.   2. Hyperdense right MCA extending beyond the bifurcation compatible with thrombus.  3. Mild diffuse subcortical white matter hypoattenuation bilaterally likely reflects microvascular ischemia.  4. ASPECTS is 4/10    CT Head Wo Contrast - pending 07/30/2017   MRI Brain Wo Contrast - pending Not performed at time of admission. Three attempts made; however patient was too combative.     Transthoracic Echocardiogram - pending 00/00/00    PHYSICAL EXAM Vitals:   07/29/17 1200 07/29/17 1300 07/29/17 1400 07/29/17 1500  BP: 119/78 (!) 168/100    Pulse: 78 72 98 84  Resp: 13 19 (!) 22 20  Temp:      TempSrc:      SpO2: 100% 98% 98% 98%  Weight:      Height:        General -  Heart - Regular rate and rhythm - no murmer appreciated Lungs - Clear to auscultation anteriorly Abdomen - Soft - non tender Extremities - Distal pulses intact - no edema Skin - Warm and dry  Mental Status: Alert,   Speech fluent without evidence of aphasia.  Able to follow simple commands Cranial Nerves: II: Discs not visualized; hemianopia III,IV, WU:[id number] gaze preference but able to look to left past midline, PERRLdecrease blink to threat on left V,VII: dense left facial droop with decreased sensation left VIII: hearing normal to voice IX,X: cough and gag intact, protecting his own airway XI: bilateral shoulder shrug intact. XII: tongue deviated Motor: Dense left arm weakness, withdraws left LE Decreased tone on the left Sensory: decreased sensation on the left Plantars: Right: downgoing   Left: upgoing Cerebellar: normal finger-to-nose right, unable on the left due to weakness Gait: not tested     ASSESSMENT/PLAN [name] is a 52 y.o. male with history of tobacco use, hypertension,and medical noncompliance presenting with left hemiplegia. He did not receive IV t-PA due to unknown time of onset.  Large right MCA stroke - embolic - from proximal high grade  carotid  stenosis  Resultant  Left hemiparesis arm >> leg, facial droop, left eye ptosis with left eye elevated with esotropia  CT head - Large right MCA territory infarct involving the insular cortex, lentiform nucleus, anterior limb internal capsule, caudate head, and right temporal lobe  MRI head - unable to perform - pt combative  on admission - consider reordering  MRA head - not ordered  CTA H&N - High-grade, critical, stenosis Rt ICA.  Rt MCA compatible with thrombus.   Repeat Head CT - pending  Carotid Doppler - CTA neck  2D Echo - pending  LDL - 106  HgbA1c - 5.0  VTE prophylaxis - Lovenox / SCDs Diet Order           Diet Heart Room service appropriate? Yes; Fluid consistency: Thin  Diet effective now          No antithrombotic prior to admission, now on aspirin 325 mg daily  Patient counseled to be compliant with his antithrombotic medications  Ongoing aggressive stroke risk factor management  Therapy recommendations:  pending  Disposition:  Pending  Hypertension  Stable - systolic blood pressure goal at this time is 120 to 180 (severe carotid stenosis with large stroke) . Long-term BP goal normotensive  Hyperlipidemia  Lipid lowering medication PTA:  none  LDL 106, goal < 70  Current lipid lowering medication: now on Lipitor 40 mg daily  Continue statin at discharge    Hypertonic Saline 3% (started 07/28/2017)  Na - 137->141->146  Repeat Head CT - Tuesday AM 5/28  Central venous catheter 07/29/2017 Dr Wallace Cullens    Other Stroke Risk Factors  Cigarette smoker - advised to stop smoking  ETOH use, advised to drink no more than 1 alcoholic beverage per day.  Overweight, Body mass index is 27.99 kg/m., recommend weight loss, diet and exercise as appropriate   Family hx stroke (grandmother had stroke in her 66s)  Substance abuse   Other Active Problems  Pancytosis  Combativeness - resolved - possibly drug related  UDS - positive for  cocaine  Severe R ICA stenosis - Dr Imogene Burn consulted - no surgery recommended at that time however patient has shown some improvement.  NS - Dr Lovell Sheehan following - surgery not indicated at this time  CCM on board   Plan / Recommendations   Stroke workup: awaiting - MRI ; repeat Head CT ; echo  Therapy Follow Up: pending  Disposition: pending  Antiplatelet / Anticoagulation: ASA 325 mg daily  (consider DAPT in future - hold for now 2/2 large stroke))  Statin: Lipitor 40 mg daily (may need to increase - LDL pending)  MD Follow Up: [facility] in 6-8 weeks  Other: consults include - CCM ; NSY; Vascular surgery for carotid stenosis   Further risk factor modification per primary care MD: Follow Up 2 weeks  I have personally examined this patient, reviewed notes, independently viewed imaging studies, participated in medical decision making and plan of care.ROS completed by me personally and pertinent positives fully documented  I have made any additions or clarifications directly to the above note. Agree with note above.  He has a large right hemispheric infarct and is developing cytotoxic edema and is at risk for neurological worsening.  Critical care medicine and neurosurgery are on board.  Continue hypertonic saline with serum sodium goal between 150-155.  Early intubation for neurological worsening and consideration for emergent right hemicraniectomy.  No family at the bedside.  Discussed with critical care MD.  Repeat CT scan of the head tomorrow morning.This patient is critically ill and at significant risk of neurological worsening, death and care requires constant monitoring of vital signs, hemodynamics,respiratory and cardiac monitoring, extensive review of multiple databases, frequent neurological assessment, discussion with family, other specialists and medical decision making of high complexity.I have made any additions or  clarifications directly to the above  note.This critical care time does not reflect procedure time, or teaching time or supervisory time of PA/NP/Med Resident etc but could involve care discussion time.  I spent 40 minutes of neurocritical care time  in the care of  this patient.      Delia Heady, MD Medical Director [facility] Pager: [phone] 07/29/2017 4:55 PM   To contact Stroke Continuity provider, please refer to [email or web address]. After hours, contact General Neurology

## 2017-07-29 NOTE — Progress Notes (Signed)
   Daily Progress Note   Assessment/Planning:   Large R MCA   Pt not responsive this AM  Again, I doubt any immediate vascular surgical intervention is likely to improve outcome at this point.  If pt's status improves, will see pt in office in 4 weeks to see if neurologic recovery adequate to justify risks of R carotid intervention.  PCCM currently in room with patient   Subjective  - * No surgery found *   No response to voice today, limited response to physical stimulus   Objective   Vitals:   07/29/17 1000 07/29/17 1100 07/29/17 1200 07/29/17 1300  BP: (!) 149/102  119/78 (!) 168/100  Pulse: 81 72 78 72  Resp: (!) 22 (!) Temp:      TempSrc:      SpO2: 98% 100% 100% 98%  Weight:      Height:         Intake/Output Summary (Last 24 hours) at 07/29/2017 1412 Last data filed at 07/29/2017 1200 Gross per 24 hour  Intake 1650 ml  Output 1200 ml  Net 450 ml    NEURO: not responsive to voice or physical stimulus  Laboratory   CBC CBC Latest Ref Rng & Units 07/29/2017 07/28/2017 07/27/2017  WBC 4.0 - 10.5 K/uL 11.7(H) 14.1(H) -  Hemoglobin 13.0 - 17.0 g/dL 40.9 18.3(H) 18.0(H)  Hematocrit 39.0 - 52.0 % 47.7 52.1(H) 53.0(H)  Platelets 150 - 400 K/uL 205 234 -    BMET    Component Value Date/Time   NA 146 (H) 07/29/2017 1130   K 3.9 07/29/2017 0611   CL 116 (H) 07/29/2017 0611   CO2 23 07/29/2017 0611   GLUCOSE 108 (H) 07/29/2017 0611   BUN 11 07/29/2017 0611   CREATININE 0.97 07/29/2017 0611   CALCIUM 8.1 (L) 07/29/2017 0611   GFRNONAA >60 07/29/2017 0611   GFRAA >60 07/29/2017 8119     Leonides Sake, MD, FACS [facility] Office: [phone] Pager: [phone]  07/29/2017, 2:12 PM

## 2017-07-29 NOTE — Progress Notes (Addendum)
Subjective: The patient is mildly somnolent and easily arousable.  He is in no apparent distress.  His sister is at the bedside.  Objective: Vital signs in last 24 hours: Temp:  [98.3 F (36.8 C)-98.7 F (37.1 C)] 98.5 F (36.9 C) (05/27 0800) Pulse Rate:  [71-98] 98 (05/27 1400) Resp:  [13-26] 22 (05/27 1400) BP: (107-169)/(69-132) 168/100 (05/27 1300) SpO2:  [89 %-100 %] 98 % (05/27 1400) Estimated body mass index is 27.99 kg/m as calculated from the following:   Height as of this encounter:  (1.651 m).   Weight as of this encounter: 76.3 kg (168 lb 3.2 oz).   Intake/Output from previous day: 05/26 0701 - 05/27 0700 In: 1315 [I.V.:1315] Out: 575 [Urine:575] Intake/Output this shift: Total I/O In: 615 [P.O.:240; I.V.:375] Out: 625 [Urine:625]  Physical exam the patient is alert and oriented x3.  His pupils are equal.  He follows commands bilaterally.  He is left hemiparetic, upper extremity greater than lower extremity without change.  Lab Results: Recent Labs    07/28/17 0254 07/29/17 0611  WBC 14.1* 11.7*  HGB 18.3* 16.1  HCT 52.1* 47.7  PLT 234 205   BMET Recent Labs    07/27/17 2025 07/27/17 2036 07/28/17 0254  07/29/17 0611 07/29/17 1130  NA 137 141  --    < > 146* 146*  K 3.7 3.7  --   --  3.9  --   CL 102 103  --   --  116*  --   CO2 25  --   --   --  23  --   GLUCOSE 120* 118*  --   --  108*  --   BUN 11 12  --   --  11  --   CREATININE 1.22 1.00 1.12  --  0.97  --   CALCIUM 9.6  --   --   --  8.1*  --    < > = values in this interval not displayed.    Studies/Results: Ct Angio Head W Or Wo Contrast  Result Date: 07/27/2017 CLINICAL DATA:  Code stroke. Left-sided weakness. Left facial droop. EXAM: CT ANGIOGRAPHY HEAD AND NECK CT PERFUSION BRAIN TECHNIQUE: Multidetector CT imaging of the head and neck was performed using the standard protocol during bolus administration of intravenous contrast. Multiplanar CT image reconstructions and MIPs  were obtained to evaluate the vascular anatomy. Carotid stenosis measurements (when applicable) are obtained utilizing NASCET criteria, using the distal internal carotid diameter as the denominator. Multiphase CT imaging of the brain was performed following IV bolus contrast injection. Subsequent parametric perfusion maps were calculated using RAPID software. CONTRAST:  ISOVUE-370 IOPAMIDOL (ISOVUE-370) INJECTION 76% COMPARISON:  CT head without contrast from the same day. FINDINGS: CTA NECK FINDINGS Aortic arch: A 3 vessel arch configuration is present. Atherosclerotic calcifications are present at the origin of the left subclavian artery without a significant stenosis. There is no aneurysm. Right carotid system: Right common carotid artery is within normal limits. There is a high-grade, near occlusive stenosis at the bifurcation. There is marked decrease in caliber of the right cervical ICA with near complete loss of contrast at the skull base. Left carotid system: Left common carotid artery demonstrates mural calcifications anteriorly. There is calcified and noncalcified plaque in the proximal left internal carotid artery. The lumen is narrowed to 2.7 mm approximately 1 cm from the bifurcation. This compares with a more normal distal measurement 4.8 mm. No tandem stenosis is present. Vertebral arteries: The  right vertebral artery is the dominant vessel. Both vertebral arteries originate from the subclavian arteries. There is a moderate stenosis of the proximal right vertebral artery relative to the more distal vessel. There is no other significant stenosis of the vertebral arteries in the neck. Skeleton: Vertebral body heights and alignment are maintained. No focal lytic or blastic lesions are present. Other neck: Soft tissues the neck are otherwise unremarkable. Thyroid is within normal limits. There is no significant adenopathy. Salivary glands are normal. Upper chest: Centrilobular emphysematous changes  are present. No focal nodule or mass lesion is present. There is no airspace disease. Review of the MIP images confirms the above findings CTA HEAD FINDINGS Anterior circulation: A posterior communicating artery is present. There is some reconstitution of the cavernous right internal carotid artery. The petrous segment is markedly narrowed. There is significant narrowing of the supraclinoid right ICA. The right M1 segment is occluded. The right A1 segment is not visualized. Atherosclerotic calcifications are present within the cavernous left internal carotid artery without a significant stenosis through the ICA terminus. The A1 and M1 segments are patent. Anterior communicating artery is patent. Both A2 segments fill from the left. The left MCA bifurcation is intact. Left MCA branch vessels are normal. There is no significant reconstitution of right MCA branch vessels. Very poor collaterals are present. Posterior circulation: The right vertebral artery is the dominant vessel. PICA origins are visualized and normal. The basilar artery is small. The left posterior cerebral artery originates from the basilar tip. The fetal type right posterior cerebral artery is present. Proximal scratched at there is mild narrowing of the left P2 segment prior to the bifurcation. There is moderate attenuation of PCA branch vessels bilaterally. Venous sinuses: The dural sinuses are patent. Straight sinus and deep cerebral veins are intact. Cortical veins are unremarkable. Anatomic variants: Fetal type right posterior cerebral artery. Review of the MIP images confirms the above findings CT Brain Perfusion Findings: CBF (<30%) Volume: 69mL Perfusion (Tmax>6.0s) volume: Mismatch Volume: 64mL Infarction Location:Right MCA territory CBF < 30% confirms ASPECTS score of at least 4 and possibly 3. The hypoperfusion index is 0.6 consistent with very poor collaterals and a rapidly progressive infarct. IMPRESSION: 1. Large right MCA  territory infarct.  CBF< 30% is 69 mL. 2. Hypoperfusion index of 0.6 consistent with very poor collaterals and rapidly progressive infarct. 3. 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 4. Marked decreased caliber of the cervical right internal carotid artery distal to the stenosis. 5. Reconstitution of the right internal carotid artery at the ophthalmic segment with some filling of the fetal type right posterior cerebral artery. 6. Occluded right M1 segment with poor collaterals. 7. Atherosclerotic changes at the left carotid bifurcation without significant stenosis. 8. Left MCA and ACA vessels are within normal limits. 9. Small basilar artery feeds the left posterior cerebral artery. The above was relayed via text pager to Dr. Nicholas Lose on 07/27/2017 at 21:08 . Electronically Signed   By: Marin Roberts M.D.   On: 07/27/2017 21:19   Dg Chest 2 View  Result Date: 07/28/2017 CLINICAL DATA:  TIA.  Patient is unable to move arm. EXAM: CHEST - 2 VIEW COMPARISON:  None. FINDINGS: Slightly shallow inspiration. Normal heart size and pulmonary vascularity. No focal airspace disease or consolidation in the lungs. No blunting of costophrenic angles. No pneumothorax. Mediastinal contours appear intact. Degenerative changes in the spine. IMPRESSION: No active cardiopulmonary disease. Electronically Signed  By: Burman Nieves M.D.   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 Awakens to voice and follows commands, but somnolent. L sided weakness. EXAM: CT HEAD WITHOUT CONTRAST TECHNIQUE: Contiguous axial images were obtained from the base of the skull through the vertex without intravenous contrast. COMPARISON:  07/28/2017 at 10:22 a.m. and earlier exams. FINDINGS:  Brain: Hypotension reflecting recent infarction extends throughout much of the right middle cerebral artery territory, including right basal ganglia. The right middle cerebral artery is hyperdense consistent thrombus/embolus. Mass effect from the recent infarct partly effaces the right lateral ventricle and overlying sulci. No significant midline shift. The extent of the right middle cerebral artery distribution infarct is unchanged from the exam performed earlier today. There are no new areas infarction. No intracranial hemorrhage. Vascular: Hyperdense right middle cerebral artery as noted on the prior exam. Skull: Normal. Negative for fracture or focal lesion. Sinuses/Orbits: No acute finding. Other: None. IMPRESSION: 1. No significant interval change from the most recent prior exam obtained earlier today. 2. Evolving, large right MCA distribution infarct.  No hemorrhage. Electronically Signed   By: Amie Portland M.D.   On: 07/28/2017 18:27   Ct Head Wo Contrast  Result Date: 07/28/2017 CLINICAL DATA:  Stroke, follow-up. EXAM: CT HEAD WITHOUT CONTRAST TECHNIQUE: Contiguous axial images were obtained from the base of the skull through the vertex without intravenous contrast. COMPARISON:  CT head without contrast and CT perfusion 07/27/2017. FINDINGS: Brain: There is expected evolution of the large right MCA territory infarct. Diffuse hypoattenuation is present throughout the anterior right temporal lobe. There is diffuse hypoattenuation involving the right insular cortex and basal ganglia. There is involvement of the insular scratched at there is involvement of the right internal capsule. Super ganglionic cortex is preserved. There is further effacement of the right lateral ventricle without downward herniation or significant midline shift. The brainstem and cerebellum are within normal limits. Vascular: Hyperdense right MCA is again noted. Atherosclerotic calcifications are present. Skull: Calvarium is intact.  No focal lytic or blastic lesions are present. Sinuses/Orbits: A right ethmoid air cells opacified. The remaining paranasal sinuses and the mastoid air cells are clear. Globes and orbits are within normal limits. IMPRESSION: 1. Expected evolution of large right MCA territory infarct without interval hemorrhage. 2. Progressive mass effect with effacement of the sulci and right lateral ventricle without significant midline shift or downward herniation. Electronically Signed   By: Marin Roberts M.D.   On: 07/28/2017 11:24   Ct Angio Neck W Or Wo Contrast  Result Date: 07/27/2017 CLINICAL DATA:  Code stroke. Left-sided weakness. Left facial droop. EXAM: CT ANGIOGRAPHY HEAD AND NECK CT PERFUSION BRAIN TECHNIQUE: Multidetector CT imaging of the head and neck was performed using the standard protocol during bolus administration of intravenous contrast. Multiplanar CT image reconstructions and MIPs were obtained to evaluate the vascular anatomy. Carotid stenosis measurements (when applicable) are obtained utilizing NASCET criteria, using the distal internal carotid diameter as the denominator. Multiphase CT imaging of the brain was performed following IV bolus contrast injection. Subsequent parametric perfusion maps were calculated using RAPID software. CONTRAST:  ISOVUE-370 IOPAMIDOL (ISOVUE-370) INJECTION 76% COMPARISON:  CT head without contrast from the same day. FINDINGS: CTA NECK FINDINGS Aortic arch: A 3 vessel arch configuration is present. Atherosclerotic calcifications are present at the origin of the  left subclavian artery without a significant stenosis. There is no aneurysm. Right carotid system: Right common carotid artery is within normal limits. There is a high-grade, near occlusive stenosis at the bifurcation. There is marked decrease in caliber of the right cervical ICA with near complete loss of contrast at the skull base. Left carotid system: Left common carotid artery demonstrates mural  calcifications anteriorly. There is calcified and noncalcified plaque in the proximal left internal carotid artery. The lumen is narrowed to 2.7 mm approximately 1 cm from the bifurcation. This compares with a more normal distal measurement 4.8 mm. No tandem stenosis is present. Vertebral arteries: The right vertebral artery is the dominant vessel. Both vertebral arteries originate from the subclavian arteries. There is a moderate stenosis of the proximal right vertebral artery relative to the more distal vessel. There is no other significant stenosis of the vertebral arteries in the neck. Skeleton: Vertebral body heights and alignment are maintained. No focal lytic or blastic lesions are present. Other neck: Soft tissues the neck are otherwise unremarkable. Thyroid is within normal limits. There is no significant adenopathy. Salivary glands are normal. Upper chest: Centrilobular emphysematous changes are present. No focal nodule or mass lesion is present. There is no airspace disease. Review of the MIP images confirms the above findings CTA HEAD FINDINGS Anterior circulation: A posterior communicating artery is present. There is some reconstitution of the cavernous right internal carotid artery. The petrous segment is markedly narrowed. There is significant narrowing of the supraclinoid right ICA. The right M1 segment is occluded. The right A1 segment is not visualized. Atherosclerotic calcifications are present within the cavernous left internal carotid artery without a significant stenosis through the ICA terminus. The A1 and M1 segments are patent. Anterior communicating artery is patent. Both A2 segments fill from the left. The left MCA bifurcation is intact. Left MCA branch vessels are normal. There is no significant reconstitution of right MCA branch vessels. Very poor collaterals are present. Posterior circulation: The right vertebral artery is the dominant vessel. PICA origins are visualized and normal. The  basilar artery is small. The left posterior cerebral artery originates from the basilar tip. The fetal type right posterior cerebral artery is present. Proximal scratched at there is mild narrowing of the left P2 segment prior to the bifurcation. There is moderate attenuation of PCA branch vessels bilaterally. Venous sinuses: The dural sinuses are patent. Straight sinus and deep cerebral veins are intact. Cortical veins are unremarkable. Anatomic variants: Fetal type right posterior cerebral artery. Review of the MIP images confirms the above findings CT Brain Perfusion Findings: CBF (<30%) Volume: 69mL Perfusion (Tmax>6.0s) volume: Mismatch Volume: 64mL Infarction Location:Right MCA territory CBF < 30% confirms ASPECTS score of at least 4 and possibly 3. The hypoperfusion index is 0.6 consistent with very poor collaterals and a rapidly progressive infarct. IMPRESSION: 1. Large right MCA territory infarct.  CBF< 30% is 69 mL. 2. Hypoperfusion index of 0.6 consistent with very poor collaterals and rapidly progressive infarct. 3. 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 4. Marked decreased caliber of the cervical right internal carotid artery distal to the stenosis. 5. Reconstitution of the right internal carotid artery at the ophthalmic segment with some filling of the fetal type right posterior cerebral artery. 6. Occluded right M1 segment with poor collaterals. 7. Atherosclerotic changes at the left carotid bifurcation without significant stenosis. 8. Left MCA and ACA vessels are within normal limits. 9. Small basilar  artery feeds the left posterior cerebral artery. The above was relayed via text pager to Dr. Nicholas Lose on 07/27/2017 at 21:08 . Electronically Signed   By: Marin Roberts M.D.   On: 07/27/2017 21:19   Ct Cerebral Perfusion W Contrast  Result Date: 07/27/2017 CLINICAL DATA:  Code stroke. Left-sided weakness. Left  facial droop. EXAM: CT ANGIOGRAPHY HEAD AND NECK CT PERFUSION BRAIN TECHNIQUE: Multidetector CT imaging of the head and neck was performed using the standard protocol during bolus administration of intravenous contrast. Multiplanar CT image reconstructions and MIPs were obtained to evaluate the vascular anatomy. Carotid stenosis measurements (when applicable) are obtained utilizing NASCET criteria, using the distal internal carotid diameter as the denominator. Multiphase CT imaging of the brain was performed following IV bolus contrast injection. Subsequent parametric perfusion maps were calculated using RAPID software. CONTRAST:  ISOVUE-370 IOPAMIDOL (ISOVUE-370) INJECTION 76% COMPARISON:  CT head without contrast from the same day. FINDINGS: CTA NECK FINDINGS Aortic arch: A 3 vessel arch configuration is present. Atherosclerotic calcifications are present at the origin of the left subclavian artery without a significant stenosis. There is no aneurysm. Right carotid system: Right common carotid artery is within normal limits. There is a high-grade, near occlusive stenosis at the bifurcation. There is marked decrease in caliber of the right cervical ICA with near complete loss of contrast at the skull base. Left carotid system: Left common carotid artery demonstrates mural calcifications anteriorly. There is calcified and noncalcified plaque in the proximal left internal carotid artery. The lumen is narrowed to 2.7 mm approximately 1 cm from the bifurcation. This compares with a more normal distal measurement 4.8 mm. No tandem stenosis is present. Vertebral arteries: The right vertebral artery is the dominant vessel. Both vertebral arteries originate from the subclavian arteries. There is a moderate stenosis of the proximal right vertebral artery relative to the more distal vessel. There is no other significant stenosis of the vertebral arteries in the neck. Skeleton: Vertebral body heights and alignment are  maintained. No focal lytic or blastic lesions are present. Other neck: Soft tissues the neck are otherwise unremarkable. Thyroid is within normal limits. There is no significant adenopathy. Salivary glands are normal. Upper chest: Centrilobular emphysematous changes are present. No focal nodule or mass lesion is present. There is no airspace disease. Review of the MIP images confirms the above findings CTA HEAD FINDINGS Anterior circulation: A posterior communicating artery is present. There is some reconstitution of the cavernous right internal carotid artery. The petrous segment is markedly narrowed. There is significant narrowing of the supraclinoid right ICA. The right M1 segment is occluded. The right A1 segment is not visualized. Atherosclerotic calcifications are present within the cavernous left internal carotid artery without a significant stenosis through the ICA terminus. The A1 and M1 segments are patent. Anterior communicating artery is patent. Both A2 segments fill from the left. The left MCA bifurcation is intact. Left MCA branch vessels are normal. There is no significant reconstitution of right MCA branch vessels. Very poor collaterals are present. Posterior circulation: The right vertebral artery is the dominant vessel. PICA origins are visualized and normal. The basilar artery is small. The left posterior cerebral artery originates from the basilar tip. The fetal type right posterior cerebral artery is present. Proximal scratched at there is mild narrowing of the left P2 segment prior to the bifurcation. There is moderate attenuation of PCA branch vessels bilaterally. Venous sinuses: The dural sinuses are patent. Straight sinus and deep cerebral veins are intact. Cortical  veins are unremarkable. Anatomic variants: Fetal type right posterior cerebral artery. Review of the MIP images confirms the above findings CT Brain Perfusion Findings: CBF (<30%) Volume: 69mL Perfusion (Tmax>6.0s) volume:  Mismatch Volume: 64mL Infarction Location:Right MCA territory CBF < 30% confirms ASPECTS score of at least 4 and possibly 3. The hypoperfusion index is 0.6 consistent with very poor collaterals and a rapidly progressive infarct. IMPRESSION: 1. Large right MCA territory infarct.  CBF< 30% is 69 mL. 2. Hypoperfusion index of 0.6 consistent with very poor collaterals and rapidly progressive infarct. 3. 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 4. Marked decreased caliber of the cervical right internal carotid artery distal to the stenosis. 5. Reconstitution of the right internal carotid artery at the ophthalmic segment with some filling of the fetal type right posterior cerebral artery. 6. Occluded right M1 segment with poor collaterals. 7. Atherosclerotic changes at the left carotid bifurcation without significant stenosis. 8. Left MCA and ACA vessels are within normal limits. 9. Small basilar artery feeds the left posterior cerebral artery. The above was relayed via text pager to Dr. Nicholas Lose on 07/27/2017 at 21:08 . Electronically Signed   By: Marin Roberts M.D.   On: 07/27/2017 21:19   Dg Chest Port 1 View  Result Date: 07/29/2017 CLINICAL DATA:  Central line placement. EXAM: PORTABLE CHEST 1 VIEW COMPARISON:  07/28/2017 FINDINGS: Left subclavian central venous line has its tip projecting within the mid superior vena cava. No pneumothorax. Lungs are clear. Heart, mediastinum and hila are unremarkable. IMPRESSION: 1. Left subclavian central venous line tip projects in the mid superior vena cava. 2. No pneumothorax.  No acute cardiopulmonary disease. Electronically Signed   By: Amie Portland M.D.   On: 07/29/2017 14:23   Dg Foot 2 Views Left  Result Date: 07/27/2017 CLINICAL DATA:  Fall with foot pain EXAM: LEFT FOOT - 2 VIEW COMPARISON:  None. FINDINGS: There is no evidence of fracture or dislocation. There is no evidence of  arthropathy or other focal bone abnormality. Soft tissues are unremarkable. IMPRESSION: Negative. Electronically Signed   By: Deatra Robinson M.D.   On: 07/27/2017 22:56   Ct Head Code Stroke Wo Contrast  Result Date: 07/27/2017 CLINICAL DATA:  Code stroke. Acute onset of left-sided weakness and facial droop beginning at 7 a.m. today, 13.5 hours ago. EXAM: CT HEAD WITHOUT CONTRAST TECHNIQUE: Contiguous axial images were obtained from the base of the skull through the vertex without intravenous contrast. COMPARISON:  None. FINDINGS: Brain: There is diffuse loss of gray-white differentiation in the right temporal lobe. There is hypoattenuation along the right insular cortex and the right lentiform nucleus. Hypoattenuation involves the right caudate head and anterior limb internal capsule. Subcortical white matter hypoattenuation is present in the super ganglionic region without additional cortical lesions. There is partial effacement of the right lateral ventricle and sulci on the right. The brainstem and cerebellum are within normal limits. Vascular: Atherosclerotic changes are present within the cavernous internal carotid arteries bilaterally. A hyperdense right MCA. Hyperdense vessels are seen beyond bifurcation. Skull: Calvarium is intact. No focal lytic or blastic lesions are present. Sinuses/Orbits: Mild mucosal thickening is present in the left maxillary sinus. There is opacification of a single right ethmoid air cell. No fluid levels are present. Globes and orbits are within normal limits. ASPECTS [facility] Stroke Program Early CT Score) - Ganglionic level infarction (caudate, lentiform nuclei, internal capsule, insula, M1-M3 cortex): 1/7 - Supraganglionic infarction (M4-M6  cortex): 3/3 Total score (0-10 with 10 being normal): 4/7 IMPRESSION: 1. Large right MCA territory infarct involving the insular cortex, lentiform nucleus, anterior limb internal capsule, caudate head, and right temporal lobe. This results  in some mass effect with effacement of the sulci and right lateral ventricle but no hemorrhage. 2. Hyperdense right MCA extending beyond the bifurcation compatible with thrombus. 3. Mild diffuse subcortical white matter hypoattenuation bilaterally likely reflects microvascular ischemia. 4. ASPECTS is 4/10 The above was relayed via text pager to Dr. Nicholas Lose on 07/27/2017 at 20:40 . Electronically Signed   By: Marin Roberts M.D.   On: 07/27/2017 20:42    Assessment/Plan: Right MCA infarction: The patient continues to be stable clinically.  We will continue with medical management as a craniectomy is not indicated presently.  I spoke with the patient's sister and answered all her questions.  LOS: 2 days     Cristi Loron 07/29/2017, 3:08 PM

## 2017-07-29 NOTE — Progress Notes (Signed)
SLP Cancellation Note  Patient Details Name: TRYSTIN TERHUNE MRN: [id number] DOB: 1966/02/05   Cancelled treatment:       Reason Eval/Treat Not Completed: Patient at procedure or test/unavailable   Abriel Geesey, Riley Nearing 07/29/2017, 1:53 PM

## 2017-07-29 NOTE — Progress Notes (Signed)
Subjective: The patient is alert and pleasant.  He has a moderate headache.  Objective: Vital signs in last 24 hours: Temp:  [98.1 F (36.7 C)-98.7 F (37.1 C)] 98.5 F (36.9 C) (05/27 0400) Pulse Rate:  [68-94] 78 (05/27 0300) Resp:  [16-26] 25 (05/27 0300) BP: (107-169)/(74-132) 158/80 (05/27 0300) SpO2:  [89 %-100 %] 98 % (05/27 0300) Estimated body mass index is 27.99 kg/m as calculated from the following:   Height as of this encounter: 5\' 5"  (1.651 m).   Weight as of this encounter: 76.3 kg (168 lb 3.2 oz).   Intake/Output from previous day: 05/26 0701 - 05/27 0700 In: 1015 [I.V.:1015] Out: 575 [Urine:575] Intake/Output this shift: Total I/O In: 600 [I.V.:600] Out: 350 [Urine:350]  Physical exam the patient is alert and oriented.  His pupils are equal.  He follows commands.  He is densely paretic in his left upper extremity and mildly weak in his left lower extremity.  He has not changed from yesterday.  Lab Results: Recent Labs    07/28/17 0254 07/29/17 0611  WBC 14.1* 11.7*  HGB 18.3* 16.1  HCT 52.1* 47.7  PLT 234 205   BMET Recent Labs    07/27/17 2025 07/27/17 2036 07/28/17 0254  07/28/17 1842 07/29/17 0038  NA 137 141  --    < > 140 144  K 3.7 3.7  --   --   --   --   CL 102 103  --   --   --   --   CO2 25  --   --   --   --   --   GLUCOSE 120* 118*  --   --   --   --   BUN 11 12  --   --   --   --   CREATININE 1.22 1.00 1.12  --   --   --   CALCIUM 9.6  --   --   --   --   --    < > = values in this interval not displayed.    Studies/Results: Ct Angio Head W Or Wo Contrast  Result Date: 07/27/2017 CLINICAL DATA:  Code stroke. Left-sided weakness. Left facial droop. EXAM: CT ANGIOGRAPHY HEAD AND NECK CT PERFUSION BRAIN TECHNIQUE: Multidetector CT imaging of the head and neck was performed using the standard protocol during bolus administration of intravenous contrast. Multiplanar CT image reconstructions and MIPs were obtained to evaluate the  vascular anatomy. Carotid stenosis measurements (when applicable) are obtained utilizing NASCET criteria, using the distal internal carotid diameter as the denominator. Multiphase CT imaging of the brain was performed following IV bolus contrast injection. Subsequent parametric perfusion maps were calculated using RAPID software. CONTRAST:  ISOVUE-370 IOPAMIDOL (ISOVUE-370) INJECTION 76% COMPARISON:  CT head without contrast from the same day. FINDINGS: CTA NECK FINDINGS Aortic arch: A 3 vessel arch configuration is present. Atherosclerotic calcifications are present at the origin of the left subclavian artery without a significant stenosis. There is no aneurysm. Right carotid system: Right common carotid artery is within normal limits. There is a high-grade, near occlusive stenosis at the bifurcation. There is marked decrease in caliber of the right cervical ICA with near complete loss of contrast at the skull base. Left carotid system: Left common carotid artery demonstrates mural calcifications anteriorly. There is calcified and noncalcified plaque in the proximal left internal carotid artery. The lumen is narrowed to 2.7 mm approximately 1 cm from the bifurcation. This compares with a more  normal distal measurement 4.8 mm. No tandem stenosis is present. Vertebral arteries: The right vertebral artery is the dominant vessel. Both vertebral arteries originate from the subclavian arteries. There is a moderate stenosis of the proximal right vertebral artery relative to the more distal vessel. There is no other significant stenosis of the vertebral arteries in the neck. Skeleton: Vertebral body heights and alignment are maintained. No focal lytic or blastic lesions are present. Other neck: Soft tissues the neck are otherwise unremarkable. Thyroid is within normal limits. There is no significant adenopathy. Salivary glands are normal. Upper chest: Centrilobular emphysematous changes are present. No focal nodule  or mass lesion is present. There is no airspace disease. Review of the MIP images confirms the above findings CTA HEAD FINDINGS Anterior circulation: A posterior communicating artery is present. There is some reconstitution of the cavernous right internal carotid artery. The petrous segment is markedly narrowed. There is significant narrowing of the supraclinoid right ICA. The right M1 segment is occluded. The right A1 segment is not visualized. Atherosclerotic calcifications are present within the cavernous left internal carotid artery without a significant stenosis through the ICA terminus. The A1 and M1 segments are patent. Anterior communicating artery is patent. Both A2 segments fill from the left. The left MCA bifurcation is intact. Left MCA branch vessels are normal. There is no significant reconstitution of right MCA branch vessels. Very poor collaterals are present. Posterior circulation: The right vertebral artery is the dominant vessel. PICA origins are visualized and normal. The basilar artery is small. The left posterior cerebral artery originates from the basilar tip. The fetal type right posterior cerebral artery is present. Proximal scratched at there is mild narrowing of the left P2 segment prior to the bifurcation. There is moderate attenuation of PCA branch vessels bilaterally. Venous sinuses: The dural sinuses are patent. Straight sinus and deep cerebral veins are intact. Cortical veins are unremarkable. Anatomic variants: Fetal type right posterior cerebral artery. Review of the MIP images confirms the above findings CT Brain Perfusion Findings: CBF (<30%) Volume: 69mL Perfusion (Tmax>6.0s) volume: Mismatch Volume: 64mL Infarction Location:Right MCA territory CBF < 30% confirms ASPECTS score of at least 4 and possibly 3. The hypoperfusion index is 0.6 consistent with very poor collaterals and a rapidly progressive infarct. IMPRESSION: 1. Large right MCA territory infarct.  CBF< 30% is 69  mL. 2. Hypoperfusion index of 0.6 consistent with very poor collaterals and rapidly progressive infarct. 3. 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 4. Marked decreased caliber of the cervical right internal carotid artery distal to the stenosis. 5. Reconstitution of the right internal carotid artery at the ophthalmic segment with some filling of the fetal type right posterior cerebral artery. 6. Occluded right M1 segment with poor collaterals. 7. Atherosclerotic changes at the left carotid bifurcation without significant stenosis. 8. Left MCA and ACA vessels are within normal limits. 9. Small basilar artery feeds the left posterior cerebral artery. The above was relayed via text pager to Dr. Nicholas Lose on 07/27/2017 at 21:08 . Electronically Signed   By: Marin Roberts M.D.   On: 07/27/2017 21:19   Dg Chest 2 View  Result Date: 07/28/2017 CLINICAL DATA:  TIA.  Patient is unable to move arm. EXAM: CHEST - 2 VIEW COMPARISON:  None. FINDINGS: Slightly shallow inspiration. Normal heart size and pulmonary vascularity. No focal airspace disease or consolidation in the lungs. No blunting of costophrenic angles. No pneumothorax. Mediastinal contours appear intact.  Degenerative changes in the spine. IMPRESSION: No active cardiopulmonary disease. Electronically Signed   By: Burman Nieves M.D.   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 Awakens to voice and follows commands, but somnolent. L sided weakness. EXAM: CT HEAD WITHOUT CONTRAST TECHNIQUE: Contiguous axial images were obtained from the base of the skull through the vertex without intravenous contrast. COMPARISON:  07/28/2017 at 10:22 a.m. and earlier exams. FINDINGS: Brain: Hypotension reflecting recent  infarction extends throughout much of the right middle cerebral artery territory, including right basal ganglia. The right middle cerebral artery is hyperdense consistent thrombus/embolus. Mass effect from the recent infarct partly effaces the right lateral ventricle and overlying sulci. No significant midline shift. The extent of the right middle cerebral artery distribution infarct is unchanged from the exam performed earlier today. There are no new areas infarction. No intracranial hemorrhage. Vascular: Hyperdense right middle cerebral artery as noted on the prior exam. Skull: Normal. Negative for fracture or focal lesion. Sinuses/Orbits: No acute finding. Other: None. IMPRESSION: 1. No significant interval change from the most recent prior exam obtained earlier today. 2. Evolving, large right MCA distribution infarct.  No hemorrhage. Electronically Signed   By: Amie Portland M.D.   On: 07/28/2017 18:27   Ct Head Wo Contrast  Result Date: 07/28/2017 CLINICAL DATA:  Stroke, follow-up. EXAM: CT HEAD WITHOUT CONTRAST TECHNIQUE: Contiguous axial images were obtained from the base of the skull through the vertex without intravenous contrast. COMPARISON:  CT head without contrast and CT perfusion 07/27/2017. FINDINGS: Brain: There is expected evolution of the large right MCA territory infarct. Diffuse hypoattenuation is present throughout the anterior right temporal lobe. There is diffuse hypoattenuation involving the right insular cortex and basal ganglia. There is involvement of the insular scratched at there is involvement of the right internal capsule. Super ganglionic cortex is preserved. There is further effacement of the right lateral ventricle without downward herniation or significant midline shift. The brainstem and cerebellum are within normal limits. Vascular: Hyperdense right MCA is again noted. Atherosclerotic calcifications are present. Skull: Calvarium is intact. No focal lytic or blastic lesions  are present. Sinuses/Orbits: A right ethmoid air cells opacified. The remaining paranasal sinuses and the mastoid air cells are clear. Globes and orbits are within normal limits. IMPRESSION: 1. Expected evolution of large right MCA territory infarct without interval hemorrhage. 2. Progressive mass effect with effacement of the sulci and right lateral ventricle without significant midline shift or downward herniation. Electronically Signed   By: Marin Roberts M.D.   On: 07/28/2017 11:24   Ct Angio Neck W Or Wo Contrast  Result Date: 07/27/2017 CLINICAL DATA:  Code stroke. Left-sided weakness. Left facial droop. EXAM: CT ANGIOGRAPHY HEAD AND NECK CT PERFUSION BRAIN TECHNIQUE: Multidetector CT imaging of the head and neck was performed using the standard protocol during bolus administration of intravenous contrast. Multiplanar CT image reconstructions and MIPs were obtained to evaluate the vascular anatomy. Carotid stenosis measurements (when applicable) are obtained utilizing NASCET criteria, using the distal internal carotid diameter as the denominator. Multiphase CT imaging of the brain was performed following IV bolus contrast injection. Subsequent parametric perfusion maps were calculated using RAPID software. CONTRAST:  ISOVUE-370 IOPAMIDOL (ISOVUE-370) INJECTION 76% COMPARISON:  CT head without contrast from the same day. FINDINGS: CTA NECK FINDINGS Aortic arch: A 3  vessel arch configuration is present. Atherosclerotic calcifications are present at the origin of the left subclavian artery without a significant stenosis. There is no aneurysm. Right carotid system: Right common carotid artery is within normal limits. There is a high-grade, near occlusive stenosis at the bifurcation. There is marked decrease in caliber of the right cervical ICA with near complete loss of contrast at the skull base. Left carotid system: Left common carotid artery demonstrates mural calcifications anteriorly. There  is calcified and noncalcified plaque in the proximal left internal carotid artery. The lumen is narrowed to 2.7 mm approximately 1 cm from the bifurcation. This compares with a more normal distal measurement 4.8 mm. No tandem stenosis is present. Vertebral arteries: The right vertebral artery is the dominant vessel. Both vertebral arteries originate from the subclavian arteries. There is a moderate stenosis of the proximal right vertebral artery relative to the more distal vessel. There is no other significant stenosis of the vertebral arteries in the neck. Skeleton: Vertebral body heights and alignment are maintained. No focal lytic or blastic lesions are present. Other neck: Soft tissues the neck are otherwise unremarkable. Thyroid is within normal limits. There is no significant adenopathy. Salivary glands are normal. Upper chest: Centrilobular emphysematous changes are present. No focal nodule or mass lesion is present. There is no airspace disease. Review of the MIP images confirms the above findings CTA HEAD FINDINGS Anterior circulation: A posterior communicating artery is present. There is some reconstitution of the cavernous right internal carotid artery. The petrous segment is markedly narrowed. There is significant narrowing of the supraclinoid right ICA. The right M1 segment is occluded. The right A1 segment is not visualized. Atherosclerotic calcifications are present within the cavernous left internal carotid artery without a significant stenosis through the ICA terminus. The A1 and M1 segments are patent. Anterior communicating artery is patent. Both A2 segments fill from the left. The left MCA bifurcation is intact. Left MCA branch vessels are normal. There is no significant reconstitution of right MCA branch vessels. Very poor collaterals are present. Posterior circulation: The right vertebral artery is the dominant vessel. PICA origins are visualized and normal. The basilar artery is small. The  left posterior cerebral artery originates from the basilar tip. The fetal type right posterior cerebral artery is present. Proximal scratched at there is mild narrowing of the left P2 segment prior to the bifurcation. There is moderate attenuation of PCA branch vessels bilaterally. Venous sinuses: The dural sinuses are patent. Straight sinus and deep cerebral veins are intact. Cortical veins are unremarkable. Anatomic variants: Fetal type right posterior cerebral artery. Review of the MIP images confirms the above findings CT Brain Perfusion Findings: CBF (<30%) Volume: 69mL Perfusion (Tmax>6.0s) volume: Mismatch Volume: 64mL Infarction Location:Right MCA territory CBF < 30% confirms ASPECTS score of at least 4 and possibly 3. The hypoperfusion index is 0.6 consistent with very poor collaterals and a rapidly progressive infarct. IMPRESSION: 1. Large right MCA territory infarct.  CBF< 30% is 69 mL. 2. Hypoperfusion index of 0.6 consistent with very poor collaterals and rapidly progressive infarct. 3. 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 4. Marked decreased caliber of the cervical right internal carotid artery distal to the stenosis. 5. Reconstitution of the right internal carotid artery at the ophthalmic segment with some filling of the fetal type right posterior cerebral artery. 6. Occluded right M1 segment with poor collaterals. 7. Atherosclerotic changes at the left carotid bifurcation without significant  stenosis. 8. Left MCA and ACA vessels are within normal limits. 9. Small basilar artery feeds the left posterior cerebral artery. The above was relayed via text pager to Dr. Nicholas Lose on 07/27/2017 at 21:08 . Electronically Signed   By: Marin Roberts M.D.   On: 07/27/2017 21:19   Ct Cerebral Perfusion W Contrast  Result Date: 07/27/2017 CLINICAL DATA:  Code stroke. Left-sided weakness. Left facial droop. EXAM: CT ANGIOGRAPHY  HEAD AND NECK CT PERFUSION BRAIN TECHNIQUE: Multidetector CT imaging of the head and neck was performed using the standard protocol during bolus administration of intravenous contrast. Multiplanar CT image reconstructions and MIPs were obtained to evaluate the vascular anatomy. Carotid stenosis measurements (when applicable) are obtained utilizing NASCET criteria, using the distal internal carotid diameter as the denominator. Multiphase CT imaging of the brain was performed following IV bolus contrast injection. Subsequent parametric perfusion maps were calculated using RAPID software. CONTRAST:  ISOVUE-370 IOPAMIDOL (ISOVUE-370) INJECTION 76% COMPARISON:  CT head without contrast from the same day. FINDINGS: CTA NECK FINDINGS Aortic arch: A 3 vessel arch configuration is present. Atherosclerotic calcifications are present at the origin of the left subclavian artery without a significant stenosis. There is no aneurysm. Right carotid system: Right common carotid artery is within normal limits. There is a high-grade, near occlusive stenosis at the bifurcation. There is marked decrease in caliber of the right cervical ICA with near complete loss of contrast at the skull base. Left carotid system: Left common carotid artery demonstrates mural calcifications anteriorly. There is calcified and noncalcified plaque in the proximal left internal carotid artery. The lumen is narrowed to 2.7 mm approximately 1 cm from the bifurcation. This compares with a more normal distal measurement 4.8 mm. No tandem stenosis is present. Vertebral arteries: The right vertebral artery is the dominant vessel. Both vertebral arteries originate from the subclavian arteries. There is a moderate stenosis of the proximal right vertebral artery relative to the more distal vessel. There is no other significant stenosis of the vertebral arteries in the neck. Skeleton: Vertebral body heights and alignment are maintained. No focal lytic or  blastic lesions are present. Other neck: Soft tissues the neck are otherwise unremarkable. Thyroid is within normal limits. There is no significant adenopathy. Salivary glands are normal. Upper chest: Centrilobular emphysematous changes are present. No focal nodule or mass lesion is present. There is no airspace disease. Review of the MIP images confirms the above findings CTA HEAD FINDINGS Anterior circulation: A posterior communicating artery is present. There is some reconstitution of the cavernous right internal carotid artery. The petrous segment is markedly narrowed. There is significant narrowing of the supraclinoid right ICA. The right M1 segment is occluded. The right A1 segment is not visualized. Atherosclerotic calcifications are present within the cavernous left internal carotid artery without a significant stenosis through the ICA terminus. The A1 and M1 segments are patent. Anterior communicating artery is patent. Both A2 segments fill from the left. The left MCA bifurcation is intact. Left MCA branch vessels are normal. There is no significant reconstitution of right MCA branch vessels. Very poor collaterals are present. Posterior circulation: The right vertebral artery is the dominant vessel. PICA origins are visualized and normal. The basilar artery is small. The left posterior cerebral artery originates from the basilar tip. The fetal type right posterior cerebral artery is present. Proximal scratched at there is mild narrowing of the left P2 segment prior to the bifurcation. There is moderate attenuation of PCA branch vessels bilaterally. Venous sinuses:  The dural sinuses are patent. Straight sinus and deep cerebral veins are intact. Cortical veins are unremarkable. Anatomic variants: Fetal type right posterior cerebral artery. Review of the MIP images confirms the above findings CT Brain Perfusion Findings: CBF (<30%) Volume: 69mL Perfusion (Tmax>6.0s) volume: Mismatch Volume: 64mL  Infarction Location:Right MCA territory CBF < 30% confirms ASPECTS score of at least 4 and possibly 3. The hypoperfusion index is 0.6 consistent with very poor collaterals and a rapidly progressive infarct. IMPRESSION: 1. Large right MCA territory infarct.  CBF< 30% is 69 mL. 2. Hypoperfusion index of 0.6 consistent with very poor collaterals and rapidly progressive infarct. 3. 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 4. Marked decreased caliber of the cervical right internal carotid artery distal to the stenosis. 5. Reconstitution of the right internal carotid artery at the ophthalmic segment with some filling of the fetal type right posterior cerebral artery. 6. Occluded right M1 segment with poor collaterals. 7. Atherosclerotic changes at the left carotid bifurcation without significant stenosis. 8. Left MCA and ACA vessels are within normal limits. 9. Small basilar artery feeds the left posterior cerebral artery. The above was relayed via text pager to Dr. Nicholas Lose on 07/27/2017 at 21:08 . Electronically Signed   By: Marin Roberts M.D.   On: 07/27/2017 21:19   Dg Foot 2 Views Left  Result Date: 07/27/2017 CLINICAL DATA:  Fall with foot pain EXAM: LEFT FOOT - 2 VIEW COMPARISON:  None. FINDINGS: There is no evidence of fracture or dislocation. There is no evidence of arthropathy or other focal bone abnormality. Soft tissues are unremarkable. IMPRESSION: Negative. Electronically Signed   By: Deatra Robinson M.D.   On: 07/27/2017 22:56   Ct Head Code Stroke Wo Contrast  Result Date: 07/27/2017 CLINICAL DATA:  Code stroke. Acute onset of left-sided weakness and facial droop beginning at 7 a.m. today, 13.5 hours ago. EXAM: CT HEAD WITHOUT CONTRAST TECHNIQUE: Contiguous axial images were obtained from the base of the skull through the vertex without intravenous contrast. COMPARISON:  None. FINDINGS: Brain: There is diffuse loss of  gray-white differentiation in the right temporal lobe. There is hypoattenuation along the right insular cortex and the right lentiform nucleus. Hypoattenuation involves the right caudate head and anterior limb internal capsule. Subcortical white matter hypoattenuation is present in the super ganglionic region without additional cortical lesions. There is partial effacement of the right lateral ventricle and sulci on the right. The brainstem and cerebellum are within normal limits. Vascular: Atherosclerotic changes are present within the cavernous internal carotid arteries bilaterally. A hyperdense right MCA. Hyperdense vessels are seen beyond bifurcation. Skull: Calvarium is intact. No focal lytic or blastic lesions are present. Sinuses/Orbits: Mild mucosal thickening is present in the left maxillary sinus. There is opacification of a single right ethmoid air cell. No fluid levels are present. Globes and orbits are within normal limits. ASPECTS [facility] Stroke Program Early CT Score) - Ganglionic level infarction (caudate, lentiform nuclei, internal capsule, insula, M1-M3 cortex): 1/7 - Supraganglionic infarction (M4-M6 cortex): 3/3 Total score (0-10 with 10 being normal): 4/7 IMPRESSION: 1. Large right MCA territory infarct involving the insular cortex, lentiform nucleus, anterior limb internal capsule, caudate head, and right temporal lobe. This results in some mass effect with effacement of the sulci and right lateral ventricle but no hemorrhage. 2. Hyperdense right MCA extending beyond the bifurcation compatible with thrombus. 3. Mild diffuse subcortical white matter hypoattenuation bilaterally likely reflects microvascular ischemia. 4.  ASPECTS is 4/10 The above was relayed via text pager to Dr. Nicholas Lose on 07/27/2017 at 20:40 . Electronically Signed   By: Marin Roberts M.D.   On: 07/27/2017 20:42    Assessment/Plan: Right MCA stroke: The patient is clinically stable.  There is no indication for  hemicraniectomy at this point.  LOS: 2 days     Cristi Loron 07/29/2017, 6:36 AM

## 2017-07-29 NOTE — Progress Notes (Signed)
PULMONARY / CRITICAL CARE MEDICINE   Name: VONDELL BABERS MRN: [id number] DOB: 1965/06/06    ADMISSION DATE:  07/27/2017 CONSULTATION DATE: 5/26  REFERRING MD: Aroor  CHIEF COMPLAINT:  Left hemiplegia  HISTORY OF THE PRESENT ILLNESS: Mr Ikard is a 52 yr old male with HTN and tobacco abuse who was brought in the hospital yesterday after waking up with left sided weakness and refused to come to the hospital until last night when eh was found to have a large right MCA stroke. I was called this morning due to altered mental status. I came to assess the patient on the floor he was arousable answers questions appropriately but still can not move his left side.  CTA is subsequently shown a high-grade left carotid occlusion.  The patient has been seen by vascular surgery who feel that he is not a candidate for revascularization as he is already evolved a large area of infarct. Despite a large evolving MCA territory infarct the patient is a very alert and appropriately interactive with me today he is in fact oriented x3.  He complains only of some headache.  He has no dyspnea or difficulty swallowing his secretions.  PAST MEDICAL HISTORY :  He  has a past medical history of GERD (gastroesophageal reflux disease), Hypertension, Nephrolithiasis, and Tobacco abuse.  PAST SURGICAL HISTORY: He  has a past surgical history that includes Umbilical hernia repair; Nephrolithotomy (Right, 09/07/2013); Holmium laser application (Right, 09/07/2013); Cystoscopy w/ ureteral stent placement (Bilateral, 09/07/2013); Nephrolithotomy (Right, 09/09/2013); Holmium laser application (Right, 09/09/2013); and Cystoscopy with retrograde pyelogram, ureteroscopy and stent placement (Bilateral, 09/09/2013).  No Known Allergies  No current facility-administered medications on file prior to encounter.    Current Outpatient Medications on File Prior to Encounter  Medication Sig  . lisinopril-hydrochlorothiazide (PRINZIDE,ZESTORETIC) 20-25  MG tablet Take 1 tablet by mouth daily.  . hydrocortisone (ANUSOL-HC) 25 MG suppository Place 1 suppository (25 mg total) rectally at bedtime. For 5 nights then as needed for hemorrhoids (Patient not taking: Reported on 07/28/2017)  . HYDROmorphone (DILAUDID) 2 MG tablet Take 1 tablet (2 mg total) by mouth every 4 (four) hours as needed for moderate pain or severe pain. Post-operatively (Patient not taking: Reported on 07/28/2017)  . senna-docusate (SENOKOT-S) 8.6-50 MG per tablet Take 1 tablet by mouth 2 (two) times daily. While taking pain meds to prevent constipation (Patient not taking: Reported on 07/28/2017)    FAMILY HISTORY:  His indicated that the status of his mother is unknown. He indicated that the status of his father is unknown. He indicated that the status of his maternal grandmother is unknown. He indicated that the status of his paternal grandmother is unknown. He indicated that the status of his maternal uncle is unknown.   SOCIAL HISTORY: He  reports that he has been smoking cigarettes.  He has a 25.00 pack-year smoking history. He has never used smokeless tobacco. He reports that he drinks alcohol. He reports that he has current or past drug history. Drug: Marijuana.  REVIEW OF SYSTEMS:   Not obtainable  SUBJECTIVE:  As above with the addition that he drinks approximately a pint of alcohol a day.  VITAL SIGNS: BP (!) 168/100   Pulse 72   Temp 98.5 F (36.9 C) (Oral)   Resp 19   Ht  (1.651 m)   Wt 168 lb 3.2 oz (76.3 kg)   SpO2 98%   BMI 27.99 kg/m   HEMODYNAMICS:    VENTILATOR SETTINGS:  INTAKE / OUTPUT: I/O last 3 completed shifts: In: 1315 [I.V.:1315] Out: 575 [Urine:575]  PHYSICAL EXAMINATION: General: Fit appearing 52 year old in no acute distress with an obvious hemiplegia Neuro: Oriented x3.  Short attention span and difficulty remembering instructions.  His left hemiplegia.  There is a slight anisocoria with the left pupil being 3-1/2 mm and  irregular on the right being 2-1/2.  This is apparently baseline Cardiovascular: S1 and S2 are regular without murmur rub or gallop Lungs: Respirations are unlabored, there are no wheezes, there are no rhonchi of the lungs are clear to supine exam Abdomen: Abdomen is soft without any organomegaly masses tenderness guarding or rebound Musculoskeletal: No edema   LABS:  BMET Recent Labs  Lab 07/27/17 2025 07/27/17 2036 07/28/17 0254  07/29/17 0038 07/29/17 0611 07/29/17 1130  NA 137 141  --    < > 144 146* 146*  K 3.7 3.7  --   --   --  3.9  --   CL 102 103  --   --   --  116*  --   CO2 25  --   --   --   --  23  --   BUN 11 12  --   --   --  11  --   CREATININE 1.22 1.00 1.12  --   --  0.97  --   GLUCOSE 120* 118*  --   --   --  108*  --    < > = values in this interval not displayed.    Electrolytes Recent Labs  Lab 07/27/17 2025 07/29/17 0611  CALCIUM 9.6 8.1*  MG  --  2.2    CBC Recent Labs  Lab 07/27/17 2025 07/27/17 2036 07/28/17 0254 07/29/17 0611  WBC 16.1*  --  14.1* 11.7*  HGB 18.0* 18.0* 18.3* 16.1  HCT 52.2* 53.0* 52.1* 47.7  PLT 254  --  234 205    Coag's Recent Labs  Lab 07/27/17 2025  APTT 26  INR 1.05    Sepsis Markers No results for input(s): LATICACIDVEN, PROCALCITON, O2SATVEN in the last 168 hours.  ABG No results for input(s): PHART, PCO2ART, PO2ART in the last 168 hours.  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 Awakens to voice  and follows commands, but somnolent. L sided weakness. EXAM: CT HEAD WITHOUT CONTRAST TECHNIQUE: Contiguous axial images were obtained from the base of the skull through the vertex without intravenous contrast. COMPARISON:  07/28/2017 at 10:22 a.m. and earlier exams. FINDINGS: Brain: Hypotension reflecting recent infarction extends throughout much of the right middle cerebral artery territory, including right basal ganglia. The right middle cerebral artery is hyperdense consistent thrombus/embolus. Mass effect from the recent infarct partly effaces the right lateral ventricle and overlying sulci. No significant midline shift. The extent of the right middle cerebral artery distribution infarct is unchanged from the exam performed earlier today. There are no new areas infarction. No intracranial  hemorrhage. Vascular: Hyperdense right middle cerebral artery as noted on the prior exam. Skull: Normal. Negative for fracture or focal lesion. Sinuses/Orbits: No acute finding. Other: None. IMPRESSION: 1. No significant interval change from the most recent prior exam obtained earlier today. 2. Evolving, large right MCA distribution infarct.  No hemorrhage. Electronically Signed   By: Amie Portland M.D.   On: 07/28/2017 18:27   Dg Chest Port 1 View  Result Date: 07/29/2017 CLINICAL DATA:  Central line placement. EXAM: PORTABLE CHEST 1 VIEW COMPARISON:  07/28/2017 FINDINGS: Left subclavian central venous line has its tip projecting within the mid superior vena cava. No pneumothorax. Lungs are clear. Heart, mediastinum and hila are unremarkable. IMPRESSION: 1. Left subclavian central venous line tip projects in the mid superior vena cava. 2. No pneumothorax.  No acute cardiopulmonary disease. Electronically Signed   By: Amie Portland M.D.   On: 07/29/2017 14:23     DISCUSSION:      This is a 52 year old smoker who presented with complaints of left-sided weakness on the afternoon of 5/26.  Apparently he had been  symptomatic for some time before presentation.  He has been found to have a right carotid occlusion and an evolving large right MCA territory infarct.  ASSESSMENT / PLAN:  PULMONARY A: No active issues at present however I am concerned if his mental status deteriorates with his large infarct that he may have some difficulties with his airway.  CARDIOVASCULAR A: Again no active issues,    GASTROINTESTINAL   HEMATOLOGIC  NEUROLOGIC A: Large evolving right MCA territory infarct.  He is not a candidate for revascularization.  I have added a statin, I am aware of his blood pressure of approximately 160 at present.  In addition to her concerns with the potential for major edema potentially requiring craniectomy, patient has a significant alcohol history and an elevated MCV.  He has been started on thiamine and we may need to add Precedex if he should again having problems with delirium.    Penny Pia, MD [facility] care [facility]  07/29/2017, 2:45 PM

## 2017-07-30 ENCOUNTER — Inpatient Hospital Stay (HOSPITAL_COMMUNITY): Payer: Managed Care, Other (non HMO)

## 2017-07-30 DIAGNOSIS — I5030 Unspecified diastolic (congestive) heart failure: Secondary | ICD-10-CM

## 2017-07-30 LAB — SODIUM
SODIUM: 146 mmol/L — AB (ref 135–145)
SODIUM: 145 mmol/L (ref 135–145)
Sodium: 144 mmol/L (ref 135–145)
Sodium: 143 mmol/L (ref 135–145)

## 2017-07-30 LAB — ECHOCARDIOGRAM COMPLETE
HEIGHTINCHES: 65 in
Weight: 2691.2 oz

## 2017-07-30 MED ORDER — NICARDIPINE HCL IN NACL 20-0.86 MG/200ML-% IV SOLN
3.0000 mg/h | INTRAVENOUS | Status: DC
  Administered 2017-07-30: 5 mg/h via INTRAVENOUS
  Filled 2017-07-30: qty 200

## 2017-07-30 MED ORDER — SODIUM CHLORIDE 0.9% FLUSH: 10.0000 mL | INTRAVENOUS | Status: DC | PRN

## 2017-07-30 MED ORDER — CHLORHEXIDINE GLUCONATE CLOTH 2 % EX PADS
6.0000 | MEDICATED_PAD | Freq: Every day | CUTANEOUS | Status: DC
  Administered 2017-07-30 – 2017-08-01 (×3): 6 via TOPICAL

## 2017-07-30 MED ORDER — SODIUM CHLORIDE 0.9% FLUSH
10.0000 mL | Freq: Two times a day (BID) | INTRAVENOUS | Status: DC
  Administered 2017-07-30 – 2017-07-31 (×3): 10 mL
  Administered 2017-08-01 (×2): 20 mL
  Administered 2017-08-02 (×2): 10 mL

## 2017-07-30 NOTE — Progress Notes (Signed)
STROKE TEAM PROGRESS NOTE   HISTORY OF PRESENT ILLNESS (per record) [name] [name] is an 52 y.o. male who woke up and noted left sided hemiplegia acutely at 7 am.  Not found until 8:30 am.  Refused to come to the ER until 8:30 pm.  CT Brain no blood, but hypodensity right MCA with ASPECTS 3-4.  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  RAPID shows core infarct 69 ml and penumbra 133 ml with ratio 1.9.  NIHSS 13.  He has a history of hypertension but not fully compliant.  No antiplatelet therapy at home.     SUBJECTIVE (INTERVAL HISTORY) No family members present. The patient is alert and follows commands. He is plegic on the left.  Serum sodium is yet 146 only. Repeat CT scan of the head shows stable appearance of the mild 4 mm midline shift from cytotoxic edema   OBJECTIVE Temp:  [98.2 F (36.8 C)-98.6 F (37 C)] 98.5 F (36.9 C) (05/28 0800) Pulse Rate:  [56-100] 63 (05/28 1200) Cardiac Rhythm: Normal sinus rhythm (05/28 1200) Resp:  [17-24] 20 (05/28 1200) BP: (139-183)/(88-123) 164/90 (05/28 1200) SpO2:  [95 %-99 %] 98 % (05/28 1200)  CBC:  Recent Labs  Lab 07/27/17 2025  07/28/17 0254 07/29/17 0611  WBC 16.1*  --  14.1* 11.7*  NEUTROABS 11.9*  --   --   --   HGB 18.0*   < > 18.3* 16.1  HCT 52.2*   < > 52.1* 47.7  MCV 97.9  --  99.0 101.3*  PLT 254  --  234 205   < > = values in this interval not displayed.    Basic Metabolic Panel:  Recent Labs  Lab 07/27/17 2025 07/27/17 2036 07/28/17 0254  07/29/17 0611  07/30/17 0611 07/30/17 1204  NA 137 141  --    < > 146*   < > 146* 143  K 3.7 3.7  --   --  3.9  --   --   --   CL 102 103  --   --  116*  --   --   --   CO2 25  --   --   --  23  --   --   --   GLUCOSE 120* 118*  --   --  108*  --   --   --   BUN 11 12  --   --  11  --   --   --   CREATININE 1.22 1.00 1.12  --  0.97  --   --   --   CALCIUM 9.6  --   --   --  8.1*  --   --   --   MG  --   --   --   --  2.2  --   --   --    < > = values in  this interval not displayed.    Lipid Panel:     Component Value Date/Time   CHOL 172 07/28/2017 0848   TRIG 99 07/28/2017 0848   HDL 46 07/28/2017 0848   CHOLHDL 3.7 07/28/2017 0848   VLDL 20 07/28/2017 0848   LDLCALC 106 (H) 07/28/2017 0848   HgbA1c:  Lab Results  Component Value Date   HGBA1C 5.0 07/28/2017   Urine Drug Screen:     Component Value Date/Time   LABOPIA NONE DETECTED 07/28/2017 1700   COCAINSCRNUR POSITIVE (A) 07/28/2017 1700   LABBENZ NONE  DETECTED 07/28/2017 1700   AMPHETMU NONE DETECTED 07/28/2017 1700   THCU NONE DETECTED 07/28/2017 1700   LABBARB NONE DETECTED 07/28/2017 1700    Alcohol Level No results found for: ETH  IMAGING  Ct Angio Head W Or Wo Contrast Ct Angio Neck W Or Wo Contrast Ct Cerebral Perfusion W Contrast 07/27/2017 IMPRESSION:  1. Large right MCA territory infarct.  CBF< 30% is 69 mL.  2. Hypoperfusion index of 0.6 consistent with very poor collaterals and rapidly progressive infarct.  3. 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  4. Marked decreased caliber of the cervical right internal carotid artery distal to the stenosis.  5. Reconstitution of the right internal carotid artery at the ophthalmic segment with some filling of the fetal type right posterior cerebral artery.  6. Occluded right M1 segment with poor collaterals.  7. Atherosclerotic changes at the left carotid bifurcation without significant stenosis.  8. Left MCA and ACA vessels are within normal limits.  9. Small basilar artery feeds the left posterior cerebral artery.    Dg Chest 2 View 07/28/2017 IMPRESSION:  No active cardiopulmonary disease.    Dg Foot 2 Views Left 07/27/2017 IMPRESSION:  Negative.    Ct Head Code Stroke Wo Contrast 07/27/2017 IMPRESSION:  1. Large right MCA territory infarct involving the insular cortex, lentiform nucleus, anterior limb internal capsule, caudate  head, and right temporal lobe. This results in some mass effect with effacement of the sulci and right lateral ventricle but no hemorrhage.  2. Hyperdense right MCA extending beyond the bifurcation compatible with thrombus.  3. Mild diffuse subcortical white matter hypoattenuation bilaterally likely reflects microvascular ischemia.  4. ASPECTS is 4/10    CT Head Wo Contrast - stable appearance of the 4 mm right left midline shift and cytotoxic edema. 07/30/2017   MRI Brain Wo Contrast - pending Not performed at time of admission. Three attempts made; however patient was too combative.     Transthoracic Echocardiogram - pending 00/00/00    PHYSICAL EXAM Vitals:   07/30/17 0900 07/30/17 1000 07/30/17 1100 07/30/17 1200  BP: (!) 183/111 (!) 172/103 (!) 165/96 (!) 164/90  Pulse: 71 65 67 63  Resp: Temp:      TempSrc:      SpO2: 96% 98% 98% 98%  Weight:      Height:        General -  Heart - Regular rate and rhythm - no murmer appreciated Lungs - Clear to auscultation anteriorly Abdomen - Soft - non tender Extremities - Distal pulses intact - no edema Skin - Warm and dry  Mental Status: Alert,   Speech fluent without evidence of aphasia.  Able to follow simple commands Cranial Nerves: II: Discs not visualized; hemianopia III,IV, RU:EAVWU gaze preference but able to look to left past midline, PERRLdecrease blink to threat on left V,VII: dense left facial droop with decreased sensation left VIII: hearing normal to voice IX,X: cough and gag intact, protecting his own airway XI: bilateral shoulder shrug intact. XII: tongue deviated Motor: Dense left arm weakness, withdraws left LE Decreased tone on the left Sensory: decreased sensation on the left Plantars: Right: downgoing   Left: upgoing Cerebellar: normal finger-to-nose right, unable on the left due to weakness Gait: not tested     ASSESSMENT/PLAN [name] is a 52 y.o. male with history  of tobacco use, hypertension,and medical noncompliance presenting with left hemiplegia. He  did not receive IV t-PA due to unknown time of onset.  Large right MCA stroke - embolic - from proximal high grade  carotid stenosis  Resultant  Left hemiparesis arm >> leg, facial droop, left eye ptosis with left eye elevated with esotropia  CT head - Large right MCA territory infarct involving the insular cortex, lentiform nucleus, anterior limb internal capsule, caudate head, and right temporal lobe  MRI head - unable to perform - pt combative on admission - consider reordering  MRA head - not ordered  CTA H&N - High-grade, critical, stenosis Rt ICA.  Rt MCA compatible with thrombus.   Repeat Head CT - pending  Carotid Doppler - CTA neck  2D Echo - pending  LDL - 106  HgbA1c - 5.0  VTE prophylaxis - Lovenox / SCDs Diet Order           Diet Heart Room service appropriate? Yes; Fluid consistency: Thin  Diet effective now          No antithrombotic prior to admission, now on aspirin 325 mg daily  Patient counseled to be compliant with his antithrombotic medications  Ongoing aggressive stroke risk factor management  Therapy recommendations:  pending  Disposition:  Pending  Hypertension  Stable - systolic blood pressure goal at this time is 120 to 180 (severe carotid stenosis with large stroke) . Long-term BP goal normotensive  Hyperlipidemia  Lipid lowering medication PTA:  none  LDL 106, goal < 70  Current lipid lowering medication: now on Lipitor 40 mg daily  Continue statin at discharge    Hypertonic Saline 3% (started 07/28/2017)  Na - 137->141->146  Repeat Head CT - Tuesday AM 5/28  Central venous catheter 07/29/2017 Dr Wallace Cullens    Other Stroke Risk Factors  Cigarette smoker - advised to stop smoking  ETOH use, advised to drink no more than 1 alcoholic beverage per day.  Overweight, Body mass index is 27.99 kg/m., recommend weight loss, diet and exercise  as appropriate   Family hx stroke (grandmother had stroke in her 41s)  Substance abuse   Other Active Problems  Pancytosis  Combativeness - resolved - possibly drug related  UDS - positive for cocaine  Severe R ICA stenosis - Dr Imogene Burn consulted - no surgery recommended at that time however patient has shown some improvement.  NS - Dr Lovell Sheehan following - surgery not indicated at this time  CCM on board   Plan / Recommendations   Stroke workup: awaiting - MRI ; repeat Head CT ; echo  Therapy Follow Up: pending  Disposition: pending  Antiplatelet / Anticoagulation: ASA 325 mg daily  (consider DAPT in future - hold for now 2/2 large stroke))  Statin: Lipitor 40 mg daily (may need to increase - LDL pending)  MD Follow Up: [facility] in 6-8 weeks  Other: consults include - CCM ; NSY; Vascular surgery for carotid stenosis   Further risk factor modification per primary care MD: Follow Up 2 weeks   .  He has a large right hemispheric infarct and is developing cytotoxic edema and is at risk for neurological worsening.  Critical care medicine and neurosurgery are on board.  Continue hypertonic saline with serum sodium goal between 150-155. Increase hypertonic drip rate to 85 mL an hour. Early intubation for neurological worsening and consideration for emergent right hemicraniectomy.  No family at the bedside.  Discussed with critical care MD.mobilize out of bed with physical occupational therapy.This patient is critically ill and at significant risk  of neurological worsening, death and care requires constant monitoring of vital signs, hemodynamics,respiratory and cardiac monitoring, extensive review of multiple databases, frequent neurological assessment, discussion with family, other specialists and medical decision making of high complexity.I have made any additions or clarifications directly to the above note.This critical care time does not reflect procedure time,  or teaching time or supervisory time of PA/NP/Med Resident etc but could involve care discussion time.  I spent 35 minutes of neurocritical care time  in the care of  this patient.      Delia Heady, MD Medical Director [facility] Pager: [phone] 07/30/2017 1:01 PM   To contact Stroke Continuity provider, please refer to [email or web address]. After hours, contact General Neurology

## 2017-07-30 NOTE — Progress Notes (Signed)
Subjective: The patient is alert and pleasant.  He is in no apparent distress.  He has appropriate questions.  Objective: Vital signs in last 24 hours: Temp:  [98.2 F (36.8 C)-98.6 F (37 C)] 98.4 F (36.9 C) (05/28 0400) Pulse Rate:  [60-100] 63 (05/28 0500) Resp:  [13-24] 19 (05/28 0500) BP: (119-183)/(77-123) 153/90 (05/28 0500) SpO2:  [95 %-100 %] 97 % (05/28 0500) Estimated body mass index is 27.99 kg/m as calculated from the following:   Height as of this encounter:  (1.651 m).   Weight as of this encounter: 76.3 kg (168 lb 3.2 oz).   Intake/Output from previous day: 05/27 0701 - 05/28 0700 In: 1776.3 [P.O.:240; I.V.:1536.3] Out: 1700 [Urine:1700] Intake/Output this shift: No intake/output data recorded.  Physical exam Glasgow Coma Scale 15.  The patient is alert and oriented x3.  He is densely paretic/plegic in his left upper extremity.  He is paretic in his left lower extremity.  His strength is normal on the right.  His pupils are equal.  Lab Results: Recent Labs    07/28/17 0254 07/29/17 0611  WBC 14.1* 11.7*  HGB 18.3* 16.1  HCT 52.1* 47.7  PLT 234 205   BMET Recent Labs    07/27/17 2025 07/27/17 2036 07/28/17 0254  07/29/17 0611  07/30/17 0305 07/30/17 0611  NA 137 141  --    < > 146*   < > 144 146*  K 3.7 3.7  --   --  3.9  --   --   --   CL 102 103  --   --  116*  --   --   --   CO2 25  --   --   --  23  --   --   --   GLUCOSE 120* 118*  --   --  108*  --   --   --   BUN 11 12  --   --  11  --   --   --   CREATININE 1.22 1.00 1.12  --  0.97  --   --   --   CALCIUM 9.6  --   --   --  8.1*  --   --   --    < > = values in this interval not displayed.    Studies/Results: Ct Head Wo Contrast  Result Date: 07/30/2017 CLINICAL DATA:  Stroke follow-up EXAM: CT HEAD WITHOUT CONTRAST TECHNIQUE: Contiguous axial images were obtained from the base of the skull through the vertex without intravenous contrast. COMPARISON:  07/28/2017 FINDINGS:  Brain: There is no mass, hemorrhage or extra-axial collection. Large right MCA territory infarct is again demonstrated with unchanged degree of cytotoxic edema causing mass effect on the right lateral ventricle. 4 mm of leftward midline shift. No hydrocephalus. Vascular: There is hyperdensity within the right MCA at the site of known thrombus. Skull: The visualized skull base, calvarium and extracranial soft tissues are normal. Sinuses/Orbits: No fluid levels or advanced mucosal thickening of the visualized paranasal sinuses. No mastoid or middle ear effusion. The orbits are normal. IMPRESSION: Unchanged examination of large right MCA territory infarct without acute hemorrhage. Electronically Signed   By: Deatra Robinson M.D.   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 Awakens to voice and follows commands, but somnolent.  L sided weakness. EXAM: CT HEAD WITHOUT CONTRAST TECHNIQUE: Contiguous axial images were obtained from the base of the skull through the vertex without intravenous contrast. COMPARISON:  07/28/2017 at 10:22 a.m. and earlier exams. FINDINGS: Brain: Hypotension reflecting recent infarction extends throughout much of the right middle cerebral artery territory, including right basal ganglia. The right middle cerebral artery is hyperdense consistent thrombus/embolus. Mass effect from the recent infarct partly effaces the right lateral ventricle and overlying sulci. No significant midline shift. The extent of the right middle cerebral artery distribution infarct is unchanged from the exam performed earlier today. There are no new areas infarction. No intracranial hemorrhage. Vascular: Hyperdense right middle cerebral artery as noted on the prior exam. Skull: Normal. Negative for fracture or focal lesion. Sinuses/Orbits:  No acute finding. Other: None. IMPRESSION: 1. No significant interval change from the most recent prior exam obtained earlier today. 2. Evolving, large right MCA distribution infarct.  No hemorrhage. Electronically Signed   By: Amie Portland M.D.   On: 07/28/2017 18:27   Ct Head Wo Contrast  Result Date: 07/28/2017 CLINICAL DATA:  Stroke, follow-up. EXAM: CT HEAD WITHOUT CONTRAST TECHNIQUE: Contiguous axial images were obtained from the base of the skull through the vertex without intravenous contrast. COMPARISON:  CT head without contrast and CT perfusion 07/27/2017. FINDINGS: Brain: There is expected evolution of the large right MCA territory infarct. Diffuse hypoattenuation is present throughout the anterior right temporal lobe. There is diffuse hypoattenuation involving the right insular cortex and basal ganglia. There is involvement of the insular scratched at there is involvement of the right internal capsule. Super ganglionic cortex is preserved. There is further effacement of the right lateral ventricle without downward herniation or significant midline shift. The brainstem and cerebellum are within normal limits. Vascular: Hyperdense right MCA is again noted. Atherosclerotic calcifications are present. Skull: Calvarium is intact. No focal lytic or blastic lesions are present. Sinuses/Orbits: A right ethmoid air cells opacified. The remaining paranasal sinuses and the mastoid air cells are clear. Globes and orbits are within normal limits. IMPRESSION: 1. Expected evolution of large right MCA territory infarct without interval hemorrhage. 2. Progressive mass effect with effacement of the sulci and right lateral ventricle without significant midline shift or downward herniation. Electronically Signed   By: Marin Roberts M.D.   On: 07/28/2017 11:24   Dg Chest Port 1 View  Result Date: 07/29/2017 CLINICAL DATA:  Central line placement. EXAM: PORTABLE CHEST 1 VIEW COMPARISON:  07/28/2017 FINDINGS:  Left subclavian central venous line has its tip projecting within the mid superior vena cava. No pneumothorax. Lungs are clear. Heart, mediastinum and hila are unremarkable. IMPRESSION: 1. Left subclavian central venous line tip projects in the mid superior vena cava. 2. No pneumothorax.  No acute cardiopulmonary disease. Electronically Signed   By: Amie Portland M.D.   On: 07/29/2017 14:23    Assessment/Plan: Right MCA infarction: The patient is stable clinically.  He does not need a hemicraniectomy.  LOS: 3 days     Cristi Loron 07/30/2017, 7:14 AM

## 2017-07-30 NOTE — Progress Notes (Signed)
PULMONARY / CRITICAL CARE MEDICINE   Name: [name] [name] MRN: [id number] DOB: Jan 11, 1966    ADMISSION DATE:  07/27/2017 CONSULTATION DATE: 5/26  REFERRING MD: Aroor  CHIEF COMPLAINT:  Left hemiplegia  HISTORY OF THE PRESENT ILLNESS: [name] [name] is a 52 yr old male with HTN and tobacco abuse who was brought in the hospital yesterday after waking up with left sided weakness and refused to come to the hospital until last night when eh was found to have a large right MCA stroke. I was called this morning due to altered mental status. I came to assess the patient on the floor he was arousable answers questions appropriately but still can not move his left side.  CTA is subsequently shown a high-grade left carotid occlusion.  The patient has been seen by vascular surgery who feel that he is not a candidate for revascularization as he had already evolved a large area of infarct. This is day 3 S/P presentation with a large right MCA territory infarct. Despite the large infarct, he is alert and conversant with me this morning, he ate breakfast. PAST MEDICAL HISTORY :  He  has a past medical history of GERD (gastroesophageal reflux disease), Hypertension, Nephrolithiasis, and Tobacco abuse.  PAST SURGICAL HISTORY: He  has a past surgical history that includes Umbilical hernia repair; Nephrolithotomy (Right, 09/07/2013); Holmium laser application (Right, 09/07/2013); Cystoscopy w/ ureteral stent placement (Bilateral, 09/07/2013); Nephrolithotomy (Right, 09/09/2013); Holmium laser application (Right, 09/09/2013); and Cystoscopy with retrograde pyelogram, ureteroscopy and stent placement (Bilateral, 09/09/2013).  No Known Allergies  No current facility-administered medications on file prior to encounter.    Current Outpatient Medications on File Prior to Encounter  Medication Sig  . lisinopril-hydrochlorothiazide (PRINZIDE,ZESTORETIC) 20-25 MG tablet Take 1 tablet by mouth daily.  . hydrocortisone (ANUSOL-HC) 25 MG  suppository Place 1 suppository (25 mg total) rectally at bedtime. For 5 nights then as needed for hemorrhoids (Patient not taking: Reported on 07/28/2017)  . HYDROmorphone (DILAUDID) 2 MG tablet Take 1 tablet (2 mg total) by mouth every 4 (four) hours as needed for moderate pain or severe pain. Post-operatively (Patient not taking: Reported on 07/28/2017)  . senna-docusate (SENOKOT-S) 8.6-50 MG per tablet Take 1 tablet by mouth 2 (two) times daily. While taking pain meds to prevent constipation (Patient not taking: Reported on 07/28/2017)    FAMILY HISTORY:  His indicated that the status of his mother is unknown. He indicated that the status of his father is unknown. He indicated that the status of his maternal grandmother is unknown. He indicated that the status of his paternal grandmother is unknown. He indicated that the status of his maternal uncle is unknown.   SOCIAL HISTORY: He  reports that he has been smoking cigarettes.  He has a 25.00 pack-year smoking history. He has never used smokeless tobacco. He reports that he drinks alcohol. He reports that he has current or past drug history. Drug: Marijuana.  REVIEW OF SYSTEMS:   Not obtainable  SUBJECTIVE:  As above with the addition that he drinks approximately a pint of alcohol a day.  VITAL SIGNS: BP (!) 150/92   Pulse 75   Temp 98.4 F (36.9 C) (Oral)   Resp 19   Ht  (1.651 m)   Wt 168 lb 3.2 oz (76.3 kg)   SpO2 97%   BMI 27.99 kg/m   HEMODYNAMICS:    VENTILATOR SETTINGS:    INTAKE / OUTPUT: I/O last 3 completed shifts: In: 2826.3 [P.O.:240; I.V.:2586.3] Out: 2050 [  Urine:2050]  PHYSICAL EXAMINATION: General: No acute distress with an obvious hemiplegia  Neuro: Oriented x3 for me.  Dense left hemiplegia normal strength on right.  Pupils equalCardiovascular: S1 and S2 are regular without murmur rub or gallop  Lungs: Respirations are unlabored, there are no wheezes, there is symmetric air movement, no rales.    Abdomen: The abdomen is soft without any organomegaly masses tenderness guarding or rebound  Musculoskeletal: No edema   LABS:  BMET Recent Labs  Lab 07/27/17 2025 07/27/17 2036 07/28/17 0254  07/29/17 4098  07/29/17 1720 07/30/17 0305 07/30/17 0611  NA 137 141  --    < > 146*   < > 147* 144 146*  K 3.7 3.7  --   --  3.9  --   --   --   --   CL 102 103  --   --  116*  --   --   --   --   CO2 25  --   --   --  23  --   --   --   --   BUN 11 12  --   --  11  --   --   --   --   CREATININE 1.22 1.00 1.12  --  0.97  --   --   --   --   GLUCOSE 120* 118*  --   --  108*  --   --   --   --    < > = values in this interval not displayed.    Electrolytes Recent Labs  Lab 07/27/17 2025 07/29/17 0611  CALCIUM 9.6 8.1*  MG  --  2.2    CBC Recent Labs  Lab 07/27/17 2025 07/27/17 2036 07/28/17 0254 07/29/17 0611  WBC 16.1*  --  14.1* 11.7*  HGB 18.0* 18.0* 18.3* 16.1  HCT 52.2* 53.0* 52.1* 47.7  PLT 254  --  234 205    Coag's Recent Labs  Lab 07/27/17 2025  APTT 26  INR 1.05    Sepsis Markers No results for input(s): LATICACIDVEN, PROCALCITON, O2SATVEN in the last 168 hours.  ABG No results for input(s): PHART, PCO2ART, PO2ART in the last 168 hours.  Liver Enzymes Recent Labs  Lab 07/27/17 2025 07/29/17 0611  AST 51* 34  ALT 37 33  ALKPHOS 71 52  BILITOT 1.1 1.0  ALBUMIN 4.1 3.3*    Cardiac Enzymes Recent Labs  Lab 07/28/17 1842 07/29/17 0038 07/29/17 0611  TROPONINI 0.03* <0.03 <0.03    Glucose Recent Labs  Lab 07/27/17 2105 07/28/17 0953  GLUCAP 94 117*    Imaging Ct Head Wo Contrast  Result Date: 07/30/2017 CLINICAL DATA:  Stroke follow-up EXAM: CT HEAD WITHOUT CONTRAST TECHNIQUE: Contiguous axial images were obtained from the base of the skull through the vertex without intravenous contrast. COMPARISON:  07/28/2017 FINDINGS: Brain: There is no mass, hemorrhage or extra-axial collection. Large right MCA territory infarct is again  demonstrated with unchanged degree of cytotoxic edema causing mass effect on the right lateral ventricle. 4 mm of leftward midline shift. No hydrocephalus. Vascular: There is hyperdensity within the right MCA at the site of known thrombus. Skull: The visualized skull base, calvarium and extracranial soft tissues are normal. Sinuses/Orbits: No fluid levels or advanced mucosal thickening of the visualized paranasal sinuses. No mastoid or middle ear effusion. The orbits are normal. IMPRESSION: Unchanged examination of large right MCA territory infarct without acute hemorrhage. Electronically Signed   By: Caryn Bee  Chase Picket M.D.   On: 07/30/2017 02:20   Dg Chest Port 1 View  Result Date: 07/29/2017 CLINICAL DATA:  Central line placement. EXAM: PORTABLE CHEST 1 VIEW COMPARISON:  07/28/2017 FINDINGS: Left subclavian central venous line has its tip projecting within the mid superior vena cava. No pneumothorax. Lungs are clear. Heart, mediastinum and hila are unremarkable. IMPRESSION: 1. Left subclavian central venous line tip projects in the mid superior vena cava. 2. No pneumothorax.  No acute cardiopulmonary disease. Electronically Signed   By: Amie Portland M.D.   On: 07/29/2017 14:23     DISCUSSION:      This is a 52 year old smoker who presented with complaints of left-sided weakness on the afternoon of 5/26.  Apparently he had been symptomatic for some time before presentation.  He has been found to have a right carotid occlusion and an evolving large right MCA territory infarct.  ASSESSMENT / PLAN:  PULMONARY A: He continues to be sufficiently alert to maintain his airway and has no active pulmonary issues   CARDIOVASCULAR A: Again no active issues,    GASTROINTESTINAL He is taking meals and on no GI prophylaxis  HEMATOLOGIC DVT prophylaxis is with Lovenox  NEUROLOGIC A: Despite a very large evolving right MCA territory infarct he is not becoming increasingly lethargic.  Pertinent to his  elevated MCV and history of alcohol intake he is not developing agitation or delirium.  He is not requiring acute life support and assistance from [place], the critical care service will not routinely follow, please reconsult [place] if needed.  Penny Pia, MD [facility]  07/30/2017, 8:19 AM

## 2017-07-30 NOTE — Progress Notes (Signed)
  Echocardiogram 2D Echocardiogram has been performed.  [name] [name] F 07/30/2017, 11:36 AM

## 2017-07-30 NOTE — Progress Notes (Signed)
Patient has been restless all night. Continuously removes BP cuff, heart monitor leads, pulse ox, and condom catheter. Patient unscrewed 3% sodium chloride line from central line. Will continue to monitor.

## 2017-07-30 NOTE — Progress Notes (Signed)
Physical Therapy Treatment Patient Details Name: [name] [name] MRN: [id number] DOB: [birth year] Today's Date: 07/30/2017    History of Present Illness 52 yo male with PMH significant for HTN, tobacco abuse admitted with R MCA infarct    PT Comments    More noticeable today the inattention to the left, mild difficulty scanning to the far left and that there was a visual  deficit on the left.   Emphasis on transitions to EOB, sit to stand, gait training close to the bed and transfers.   Follow Up Recommendations  CIR     Equipment Recommendations  Other (comment)    Recommendations for Other Services Rehab consult     Precautions / Restrictions Precautions Precautions: Fall Precaution Comments: lacks awareness to L side    Mobility  Bed Mobility Overal bed mobility: Needs Assistance Bed Mobility: Supine to Sit     Supine to sit: Min assist     General bed mobility comments: pt needs cues and physical (A) to sit on EOB initially. pt attempt the task with speed and lacks control for transfer  Transfers Overall transfer level: Needs assistance   Transfers: Sit to/from Stand;Stand Pivot Transfers Sit to Stand: +2 physical assistance;Min assist Stand pivot transfers: +2 physical assistance;Min assist       General transfer comment: pt requires cues for safety and with fatigue (A) to power up into standing. pt very impulsive with transfer and standing x1 during session without (A)  Ambulation/Gait Ambulation/Gait assistance: Min assist;+2 physical assistance Ambulation Distance (Feet): 4 Feet(forward and back) Assistive device: 2 person hand held assist Gait Pattern/deviations: Step-through pattern     General Gait Details: paretic in nature on the left, advancing L LE initiating at the hip with forefoot contact.  Worked to isolate stepping backward without drag back.   Stairs             Wheelchair Mobility    Modified Rankin (Stroke Patients Only)        Balance Overall balance assessment: Needs assistance Sitting-balance support: No upper extremity supported;Feet supported Sitting balance-Leahy Scale: Good     Standing balance support: Single extremity supported;During functional activity Standing balance-Leahy Scale: Poor Standing balance comment: pt able to static stand min (A)                            Cognition Arousal/Alertness: Awake/alert Behavior During Therapy: WFL for tasks assessed/performed Overall Cognitive Status: Impaired/Different from baseline Area of Impairment: Awareness;Problem solving                           Awareness: Emergent Problem Solving: Slow processing;Requires verbal cues General Comments: Pt lacks awareness to L side and more obvious this session.  pt reports not eating due to lack of utensils. pt with utensils in L visual field. pt asking therapist "i lost my roll . do you see it anyway?" pt with roll on L side of plate.       Exercises      General Comments General comments (skin integrity, edema, etc.): cues during session for safety of L UE . pt able to lcoate with R UE and correctly place L UE with cue      Pertinent Vitals/Pain Pain Assessment: No/denies pain    Home Living     Available Help at Discharge: Family;Available PRN/intermittently Type of Home: Mobile home  Prior Function            PT Goals (current goals can now be found in the care plan section) Acute Rehab PT Goals Patient Stated Goal: wants his sister to find him a lady PT Goal Formulation: With patient Time For Goal Achievement: 08/12/17 Potential to Achieve Goals: Good Progress towards PT goals: Progressing toward goals    Frequency    Min 4X/week      PT Plan Current plan remains appropriate    Co-evaluation PT/OT/SLP Co-Evaluation/Treatment: Yes Reason for Co-Treatment: To address functional/ADL transfers PT goals addressed during session:  Mobility/safety with mobility OT goals addressed during session: ADL's and self-care;Proper use of Adaptive equipment and DME;Strengthening/ROM      AM-PAC PT "6 Clicks" Daily Activity  Outcome Measure  Difficulty turning over in bed (including adjusting bedclothes, sheets and blankets)?: Unable Difficulty moving from lying on back to sitting on the side of the bed? : Unable Difficulty sitting down on and standing up from a chair with arms (e.g., wheelchair, bedside commode, etc,.)?: Unable Help needed moving to and from a bed to chair (including a wheelchair)?: A Little Help needed walking in hospital room?: A Lot Help needed climbing 3-5 steps with a railing? : A Lot 6 Click Score: 10    End of Session   Activity Tolerance: Patient tolerated treatment well Patient left: in chair;with call bell/phone within reach;with chair alarm set Nurse Communication: Mobility status PT Visit Diagnosis: Unsteadiness on feet (R26.81);Other abnormalities of gait and mobility (R26.89);Hemiplegia and hemiparesis Hemiplegia - Right/Left: Left Hemiplegia - dominant/non-dominant: Non-dominant Hemiplegia - caused by: Cerebral infarction     Time: 1330-1403 PT Time Calculation (min) (ACUTE ONLY): 33 min  Charges:  $Therapeutic Activity: 8-22 mins                    G Codes:       Aug 19, 2017  Crooks Bing, PT [phone]  (pager)   Eliseo Gum Klair Leising 08-19-2017, 5:27 PM

## 2017-07-30 NOTE — Progress Notes (Signed)
Occupational Therapy Treatment Patient Details Name: [name] [name] MRN: [id number] DOB: 07-01-1965 Today's Date: 07/30/2017    History of present illness 52 yo male with PMH significant for HTN, tobacoo abuse admitted with R MCA infarct   OT comments  Pt requires cues for locating food on L side of tray during session. Pt could benefit from (A) with meals due to pt sitting with tray in front of him not eating because he could not locate the utensils and did not call for help. Pt motivated to get better. Pt wanting therapist to (A) with cell phone use to check bank account and pt unable to recall correct log in information but did locate the correct app in phone.    Follow Up Recommendations  CIR    Equipment Recommendations  Other (comment)    Recommendations for Other Services Rehab consult    Precautions / Restrictions Precautions Precautions: Fall Precaution Comments: lacks awarness to L side       Mobility Bed Mobility Overal bed mobility: Needs Assistance Bed Mobility: Supine to Sit     Supine to sit: Min assist     General bed mobility comments: pt needs cues and physical (A) to sit on EOB initially. pt attempt the task with speed and lacks control for transfer  Transfers Overall transfer level: Needs assistance   Transfers: Sit to/from Stand;Stand Pivot Transfers Sit to Stand: +2 physical assistance;Min assist Stand pivot transfers: +2 physical assistance;Min assist       General transfer comment: pt requires cues for safety and with fatigue (A) to power up into standing. pt very impulsive with transfer and standing x1 during session without (A)    Balance Overall balance assessment: Needs assistance Sitting-balance support: No upper extremity supported;Feet supported Sitting balance-Leahy Scale: Good     Standing balance support: Single extremity supported;During functional activity Standing balance-Leahy Scale: Poor Standing balance comment: pt able to  static stand min (A)                           ADL either performed or assessed with clinical judgement   ADL Overall ADL's : Needs assistance/impaired Eating/Feeding: Set up Eating/Feeding Details (indicate cue type and reason): pt needed verbal cue to the shape of the tray "states square' pt cues to number of corners pt states "4" pt asked to locate each corner. Pt able to locate phone and utensils using the 4 corner method of scanning. pt was unable to follow command to simply "look left" provided by PT. pt does extremely well with use of corners. Pt could benefit from all items within the frame of the tray Grooming: Set up   Upper Body Bathing: Moderate assistance   Lower Body Bathing: Maximal assistance       Lower Body Dressing: Maximal assistance Lower Body Dressing Details (indicate cue type and reason): pt able to flex R knee and use R UE to reach for foot. pt needs sock started on R foot to porgress to don of sock Toilet Transfer: +2 for physical assistance;Minimal assistance Toilet Transfer Details (indicate cue type and reason): scissor gait noted         Functional mobility during ADLs: +2 for physical assistance;Minimal assistance General ADL Comments: pt with scissoring and narrowed gait with transfer. pt needed max cues for sequence and L LE placement/ safety. pt needs faciliation of R weight shift to advance LLE     Vision  Pt noted to have  L visual field deficits. Pt with vision occlusion noted ot have slower pupil response on L. Pt with R eye tracting and L eye lagging with visual scanning. Pt able to cross midline with mod cues. Pt reports items disappear on the L side of visual field . Pt with occlusion of L eye and R eye to isolate to monocular vision and reports objects in L visual field disappear.      Perception     Praxis      Cognition Arousal/Alertness: Awake/alert Behavior During Therapy: WFL for tasks assessed/performed Overall  Cognitive Status: Impaired/Different from baseline Area of Impairment: Awareness;Problem solving;Attention                           Awareness: Emergent Problem Solving: Slow processing;Requires verbal cues General Comments: Pt lacks awareness to L side and more obvious thi session pt reports not eating due to lack of utensils. pt iwth utensils in L visual field. pt asking therapist "i lost my roll . do you see it anyway?" pt with roll on L side of plate.         Exercises     Shoulder Instructions       General Comments cues during session for safety of L UE . pt able to lcoate with R UE and correctly place L UE with cue    Pertinent Vitals/ Pain       Pain Assessment: No/denies pain  Home Living                                          Prior Functioning/Environment              Frequency  Min 3X/week        Progress Toward Goals  OT Goals(current goals can now be found in the care plan section)  Progress towards OT goals: Progressing toward goals  Acute Rehab OT Goals Patient Stated Goal: wants his sister to find him a lady OT Goal Formulation: With patient Time For Goal Achievement: 08/12/17 Potential to Achieve Goals: Good ADL Goals Pt Will Perform Grooming: with min guard assist;sitting Pt Will Perform Upper Body Bathing: with min assist;sitting Pt Will Transfer to Toilet: with mod assist;regular height toilet;stand pivot transfer Additional ADL Goal #1: Pt will complete bed mobilty supervision as precursor to adls  Plan Discharge plan remains appropriate    Co-evaluation    PT/OT/SLP Co-Evaluation/Treatment: Yes Reason for Co-Treatment: To address functional/ADL transfers;For patient/therapist safety   OT goals addressed during session: ADL's and self-care;Proper use of Adaptive equipment and DME;Strengthening/ROM      AM-PAC PT "6 Clicks" Daily Activity     Outcome Measure   Help from another person eating meals?:  A Little Help from another person taking care of personal grooming?: A Little Help from another person toileting, which includes using toliet, bedpan, or urinal?: A Lot Help from another person bathing (including washing, rinsing, drying)?: A Lot Help from another person to put on and taking off regular upper body clothing?: A Lot Help from another person to put on and taking off regular lower body clothing?: A Lot 6 Click Score: 14    End of Session    OT Visit Diagnosis: Unsteadiness on feet (R26.81);Muscle weakness (generalized) (M62.81);Other symptoms and signs involving cognitive function;Hemiplegia and hemiparesis Hemiplegia - Right/Left: Left Hemiplegia - dominant/non-dominant: Non-Dominant  Hemiplegia - caused by: Cerebral infarction   Activity Tolerance Patient tolerated treatment well   Patient Left in chair;with call bell/phone within reach;with bed alarm set   Nurse Communication Mobility status;Precautions        Time: 1330(1330)-1404 OT Time Calculation (min): 34 min  Charges: OT General Charges $OT Visit: 1 Visit OT Treatments $Self Care/Home Management : 8-22 mins   [name] [name]   OTR/L Pager: [phone] Office: [phone] .    [name] [name] 07/30/2017, 3:20 PM

## 2017-07-30 NOTE — Evaluation (Signed)
Speech Language Pathology Evaluation Patient Details Name: [name] [name] MRN: [id number] DOB: Oct 14, 1965 Today's Date: 07/30/2017 Time: 1450-1510 SLP Time Calculation (min) (ACUTE ONLY): 20 min  Problem List:  Patient Active Problem List   Diagnosis Date Noted  . Acute encephalopathy   . Essential hypertension   . Cerebral infarction due to embolism of right middle cerebral artery (HCC)   . 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 Staghorn calculus 08/24/2013  . Hemorrhoids, internal, with bleeding and Grade 2 prolapse 08/24/2013   Past Medical History:  Past Medical History:  Diagnosis Date  . GERD (gastroesophageal reflux disease)   . Hypertension   . Nephrolithiasis   . Tobacco abuse    Past Surgical History:  Past Surgical History:  Procedure Laterality Date  . CYSTOSCOPY W/ URETERAL STENT PLACEMENT Bilateral 09/07/2013   Procedure: CYSTOSCOPY WITH RETROGRADE PYELOGRAM/URETERAL STENT PLACEMENT;  Surgeon: Sebastian Ache, MD;  Location: [facility];  Service: Urology;  Laterality: Bilateral;  . CYSTOSCOPY WITH RETROGRADE PYELOGRAM, URETEROSCOPY AND STENT PLACEMENT Bilateral 09/09/2013   Procedure: CYSTOSCOPY WITH RETROGRADE PYELOGRAM, DIAGNOSTIC URETEROSCOPY AND STENT CHANGE;  Surgeon: Sebastian Ache, MD;  Location: [facility];  Service: Urology;  Laterality: Bilateral;  . HOLMIUM LASER APPLICATION Right 09/07/2013   Procedure: HOLMIUM LASER APPLICATION;  Surgeon: Sebastian Ache, MD;  Location: [facility];  Service: Urology;  Laterality: Right;  . HOLMIUM LASER APPLICATION Right 09/09/2013   Procedure: HOLMIUM LASER APPLICATION;  Surgeon: Sebastian Ache, MD;  Location: [facility];  Service: Urology;  Laterality: Right;  . NEPHROLITHOTOMY Right 09/07/2013   Procedure: FIRST STAGE RIGHT PERCUTANEOUS NEPHROLITHOTOMY  WITH ACCESS, ;  Surgeon: Sebastian Ache, MD;  Location: [facility];  Service: Urology;  Laterality: Right;  . NEPHROLITHOTOMY Right 09/09/2013   Procedure:  RIGHT 2ND STAGE NEPHROLITHOTOMY PERCUTANEOUS;  Surgeon: Sebastian Ache, MD;  Location: [facility];  Service: Urology;  Laterality: Right;  . UMBILICAL HERNIA REPAIR     HPI:  EdwardMoseris a 52 y.o.malewith a known history of GERD, HTN, nephrolithiasis, tobacco dependencewas in a usual state of health until this morning when he woke up at 7am with left sided weakness.Was found by family who was unable to lift him into bed. 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 Thought to not be a good candidate for thrombectomy due to prolonged time from onset.  At baseline patient works for a [employer], functionally independent. Admits to drinking a pint of whiskey daily.    Assessment / Plan / Recommendation Clinical Impression  Pt presents with moderate cognitive impairments with deficits in memory (storage, retrieval, recall of new information), basic problem solving, sustained attention, inattention to left, lack of intellectual awareness, impulsivity and poor task tolerance. Pt was frustrated by inability to recall password for banking app and pleasantly declined formal testing on this date. Skilled ST is required to address the above mentioned deficits and increase functional independence. ST to follow acutely.    SLP Assessment  SLP Recommendation/Assessment: Patient needs continued Speech Lanaguage Pathology Services SLP Visit Diagnosis: Cognitive communication deficit (R41.841)    Follow Up Recommendations  Inpatient Rehab    Frequency and Duration min 2x/week  2 weeks      SLP Evaluation Cognition  Overall Cognitive Status: Impaired/Different from baseline Arousal/Alertness: Awake/alert Orientation Level:  Oriented to person;Oriented to place;Oriented to situation;Disoriented to time Attention:  Selective;Sustained Sustained Attention: Impaired Sustained Attention Impairment: Verbal complex;Functional complex Selective Attention: Impaired Selective Attention Impairment: Verbal complex;Functional complex Memory: Impaired Memory Impairment: Retrieval deficit;Decreased recall of new information;Decreased short term memory Decreased Short Term Memory: Verbal basic;Functional basic Awareness: Impaired Awareness Impairment: Emergent impairment;Anticipatory impairment;Intellectual impairment Problem Solving: Impaired Problem Solving Impairment: Verbal basic;Functional basic Executive Function: Reasoning;Organizing;Decision Making;Initiating;Self Monitoring;Self Correcting Reasoning: Impaired Reasoning Impairment: Verbal basic;Functional basic Organizing: Impaired Organizing Impairment: Verbal basic;Functional basic Decision Making: Impaired Decision Making Impairment: Verbal basic;Functional basic Initiating: Impaired Initiating Impairment: Verbal basic;Functional basic Self Monitoring: Impaired Self Monitoring Impairment: Verbal basic;Functional basic Self Correcting: Impaired Self Correcting Impairment: Verbal basic;Functional basic Behaviors: Restless;Impulsive;Poor frustration tolerance Safety/Judgment: Impaired       Comprehension  Auditory Comprehension Overall Auditory Comprehension: Appears within functional limits for tasks assessed Visual Recognition/Discrimination Discrimination: Not tested(significant left inattention) Reading Comprehension Reading Status: Not tested    Expression Expression Primary Mode of Expression: Verbal Verbal Expression Overall Verbal Expression: Appears within functional limits for tasks assessed Initiation: Impaired Written Expression Dominant Hand: Right Written Expression: Not tested   Oral / Motor  Oral Motor/Sensory Function Overall Oral Motor/Sensory Function: Within functional limits Motor Speech Overall Motor Speech:  Appears within functional limits for tasks assessed Respiration: Within functional limits Phonation: Normal Resonance: Within functional limits Articulation: Within functional limitis Intelligibility: Intelligible Motor Planning: Witnin functional limits Motor Speech Errors: Not applicable   GO                    Kristiane Morsch 07/30/2017, 3:44 PM

## 2017-07-31 LAB — SODIUM
SODIUM: 144 mmol/L (ref 135–145)
SODIUM: 144 mmol/L (ref 135–145)
SODIUM: 145 mmol/L (ref 135–145)
SODIUM: 146 mmol/L — AB (ref 135–145)

## 2017-07-31 MED ORDER — ADULT MULTIVITAMIN W/MINERALS CH
1.0000 | ORAL_TABLET | Freq: Every day | ORAL | Status: DC
  Administered 2017-07-31 – 2017-08-05 (×6): 1 via ORAL
  Filled 2017-07-31 (×6): qty 1

## 2017-07-31 MED ORDER — THIAMINE HCL 100 MG/ML IJ SOLN: 100.0000 mg | Freq: Every day | INTRAMUSCULAR | Status: DC

## 2017-07-31 MED ORDER — LORAZEPAM 2 MG/ML IJ SOLN: 0.0000 mg | Freq: Two times a day (BID) | INTRAMUSCULAR | Status: DC

## 2017-07-31 MED ORDER — LORAZEPAM 2 MG/ML IJ SOLN: 0.0000 mg | Freq: Four times a day (QID) | INTRAMUSCULAR | Status: DC

## 2017-07-31 MED ORDER — LORAZEPAM 1 MG PO TABS: 1.0000 mg | ORAL_TABLET | Freq: Four times a day (QID) | ORAL | Status: DC | PRN

## 2017-07-31 MED ORDER — VITAMIN B-1 100 MG PO TABS
100.0000 mg | ORAL_TABLET | Freq: Every day | ORAL | Status: DC
  Administered 2017-07-31 – 2017-08-02 (×3): 100 mg via ORAL
  Filled 2017-07-31 (×3): qty 1

## 2017-07-31 MED ORDER — LORAZEPAM 2 MG/ML IJ SOLN: 1.0000 mg | Freq: Four times a day (QID) | INTRAMUSCULAR | Status: DC | PRN

## 2017-07-31 MED ORDER — LISINOPRIL 20 MG PO TABS
20.0000 mg | ORAL_TABLET | Freq: Every day | ORAL | Status: DC
  Administered 2017-07-31 – 2017-08-02 (×3): 20 mg via ORAL
  Filled 2017-07-31 (×3): qty 1

## 2017-07-31 MED ORDER — LISINOPRIL-HYDROCHLOROTHIAZIDE 20-25 MG PO TABS
1.0000 | ORAL_TABLET | Freq: Every day | ORAL | Status: DC
Start: 2017-07-31 — End: 2017-07-31

## 2017-07-31 MED ORDER — HYDROCHLOROTHIAZIDE 25 MG PO TABS
25.0000 mg | ORAL_TABLET | Freq: Every day | ORAL | Status: DC
  Administered 2017-07-31 – 2017-08-02 (×3): 25 mg via ORAL
  Filled 2017-07-31 (×3): qty 1

## 2017-07-31 MED ORDER — FOLIC ACID 1 MG PO TABS
1.0000 mg | ORAL_TABLET | Freq: Every day | ORAL | Status: DC
  Administered 2017-07-31 – 2017-08-05 (×6): 1 mg via ORAL
  Filled 2017-07-31 (×6): qty 1

## 2017-07-31 NOTE — Progress Notes (Signed)
STROKE TEAM PROGRESS NOTE   HISTORY OF PRESENT ILLNESS (per record) [name] [name] is an 52 y.o. male who woke up and noted left sided hemiplegia acutely at 7 am.  Not found until 8:30 am.  Refused to come to the ER until 8:30 pm.  CT Brain no blood, but hypodensity right MCA with ASPECTS 3-4.  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  RAPID shows core infarct 69 ml and penumbra 133 ml with ratio 1.9.  NIHSS 13.  He has a history of hypertension but not fully compliant.  No antiplatelet therapy at home.     SUBJECTIVE (INTERVAL HISTORY) No family members present. The patient remains alert and follows commands. He remains plegic on the left.  Serum sodium is yet 146 only.    OBJECTIVE Temp:  [98 F (36.7 C)-98.9 F (37.2 C)] 98.5 F (36.9 C) (05/29 1615) Pulse Rate:  [58-89] 83 (05/29 1500) Cardiac Rhythm: Normal sinus rhythm (05/29 0800) Resp:  [12-32] 20 (05/29 1610) BP: (117-183)/(76-171) 173/92 (05/29 1600) SpO2:  [92 %-100 %] 98 % (05/29 1500)  CBC:  Recent Labs  Lab 07/27/17 2025  07/28/17 0254 07/29/17 0611  WBC 16.1*  --  14.1* 11.7*  NEUTROABS 11.9*  --   --   --   HGB 18.0*   < > 18.3* 16.1  HCT 52.2*   < > 52.1* 47.7  MCV 97.9  --  99.0 101.3*  PLT 254  --  234 205   < > = values in this interval not displayed.    Basic Metabolic Panel:  Recent Labs  Lab 07/27/17 2025 07/27/17 2036 07/28/17 0254  07/29/17 0611  07/31/17 0018 07/31/17 0600  NA 137 141  --    < > 146*   < > 145 144  K 3.7 3.7  --   --  3.9  --   --   --   CL 102 103  --   --  116*  --   --   --   CO2 25  --   --   --  23  --   --   --   GLUCOSE 120* 118*  --   --  108*  --   --   --   BUN 11 12  --   --  11  --   --   --   CREATININE 1.22 1.00 1.12  --  0.97  --   --   --   CALCIUM 9.6  --   --   --  8.1*  --   --   --   MG  --   --   --   --  2.2  --   --   --    < > = values in this interval not displayed.    Lipid Panel:     Component Value Date/Time   CHOL 172  07/28/2017 0848   TRIG 99 07/28/2017 0848   HDL 46 07/28/2017 0848   CHOLHDL 3.7 07/28/2017 0848   VLDL 20 07/28/2017 0848   LDLCALC 106 (H) 07/28/2017 0848   HgbA1c:  Lab Results  Component Value Date   HGBA1C 5.0 07/28/2017   Urine Drug Screen:     Component Value Date/Time   LABOPIA NONE DETECTED 07/28/2017 1700   COCAINSCRNUR POSITIVE (A) 07/28/2017 1700   LABBENZ NONE DETECTED 07/28/2017 1700   AMPHETMU NONE DETECTED 07/28/2017 1700   THCU NONE DETECTED 07/28/2017 1700  LABBARB NONE DETECTED 07/28/2017 1700    Alcohol Level No results found for: ETH  IMAGING  Ct Angio Head W Or Wo Contrast Ct Angio Neck W Or Wo Contrast Ct Cerebral Perfusion W Contrast 07/27/2017 IMPRESSION:  1. Large right MCA territory infarct.  CBF< 30% is 69 mL.  2. Hypoperfusion index of 0.6 consistent with very poor collaterals and rapidly progressive infarct.  3. 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  4. Marked decreased caliber of the cervical right internal carotid artery distal to the stenosis.  5. Reconstitution of the right internal carotid artery at the ophthalmic segment with some filling of the fetal type right posterior cerebral artery.  6. Occluded right M1 segment with poor collaterals.  7. Atherosclerotic changes at the left carotid bifurcation without significant stenosis.  8. Left MCA and ACA vessels are within normal limits.  9. Small basilar artery feeds the left posterior cerebral artery.    Dg Chest 2 View 07/28/2017 IMPRESSION:  No active cardiopulmonary disease.    Dg Foot 2 Views Left 07/27/2017 IMPRESSION:  Negative.    Ct Head Code Stroke Wo Contrast 07/27/2017 IMPRESSION:  1. Large right MCA territory infarct involving the insular cortex, lentiform nucleus, anterior limb internal capsule, caudate head, and right temporal lobe. This results in some mass effect with effacement of the sulci  and right lateral ventricle but no hemorrhage.  2. Hyperdense right MCA extending beyond the bifurcation compatible with thrombus.  3. Mild diffuse subcortical white matter hypoattenuation bilaterally likely reflects microvascular ischemia.  4. ASPECTS is 4/10    CT Head Wo Contrast - stable appearance of the 4 mm right left midline shift and cytotoxic edema. 07/30/2017   MRI Brain Wo Contrast - pending Not performed at time of admission. Three attempts made; however patient was too combative.     Transthoracic Echocardiogram - pending 00/00/00    PHYSICAL EXAM Vitals:   07/31/17 1500 07/31/17 1600 07/31/17 1610 07/31/17 1615  BP: (!) 155/100 (!) 173/92    Pulse: 83     Resp: (!) Temp:    98.5 F (36.9 C)  TempSrc:    Oral  SpO2: 98%     Weight:      Height:        General -  Heart - Regular rate and rhythm - no murmer appreciated Lungs - Clear to auscultation anteriorly Abdomen - Soft - non tender Extremities - Distal pulses intact - no edema Skin - Warm and dry  Mental Status: Drowsy but can be aroused easily   Speech fluent without evidence of aphasia.  Able to follow simple commands Cranial Nerves: II: Discs not visualized; hemianopia III,IV, ZO:XWRUE gaze preference but able to look to left past midline, PERRLdecrease blink to threat on left V,VII: dense left facial droop with decreased sensation left VIII: hearing normal to voice IX,X: cough and gag intact, protecting his own airway XI: bilateral shoulder shrug intact. XII: tongue deviated Motor: Dense left arm weakness, withdraws left LE Decreased tone on the left Sensory: decreased sensation on the left Plantars: Right: downgoing   Left: upgoing Cerebellar: normal finger-to-nose right, unable on the left due to weakness Gait: not tested     ASSESSMENT/PLAN [name] is a 52 y.o. male with history of tobacco use, hypertension,and medical noncompliance presenting with left  hemiplegia. He did not receive IV t-PA due to unknown time of onset.  Large right MCA stroke - embolic - from proximal high grade  carotid stenosis  Resultant  Left hemiparesis arm >> leg, facial droop, left eye ptosis with left eye elevated with esotropia  CT head - Large right MCA territory infarct involving the insular cortex, lentiform nucleus, anterior limb internal capsule, caudate head, and right temporal lobe  MRI head - unable to perform - pt combative on admission - consider reordering  MRA head - not ordered  CTA H&N - High-grade, critical, stenosis Rt ICA.  Rt MCA compatible with thrombus.   Repeat Head CT - pending  Carotid Doppler - CTA neck  2D Echo - pending  LDL - 106  HgbA1c - 5.0  VTE prophylaxis - Lovenox / SCDs Diet Order           Diet Heart Room service appropriate? Yes; Fluid consistency: Thin  Diet effective now          No antithrombotic prior to admission, now on aspirin 325 mg daily  Patient counseled to be compliant with his antithrombotic medications  Ongoing aggressive stroke risk factor management  Therapy recommendations:  pending  Disposition:  Pending  Hypertension  Stable - systolic blood pressure goal at this time is 120 to 180 (severe carotid stenosis with large stroke) . Long-term BP goal normotensive  Hyperlipidemia  Lipid lowering medication PTA:  none  LDL 106, goal < 70  Current lipid lowering medication: now on Lipitor 40 mg daily  Continue statin at discharge    Hypertonic Saline 3% (started 07/28/2017)  Na - 137->141->146  Repeat Head CT - Tuesday AM 5/28  Central venous catheter 07/29/2017 Dr Wallace Cullens    Other Stroke Risk Factors  Cigarette smoker - advised to stop smoking  ETOH use, advised to drink no more than 1 alcoholic beverage per day.  Overweight, Body mass index is 27.99 kg/m., recommend weight loss, diet and exercise as appropriate   Family hx stroke (grandmother had stroke in her  61s)  Substance abuse   Other Active Problems  Pancytosis  Combativeness - resolved - possibly drug related  UDS - positive for cocaine  Severe R ICA stenosis - Dr Imogene Burn consulted - no surgery recommended at that time however patient has shown some improvement.  NS - Dr Lovell Sheehan following - surgery not indicated at this time  CCM on board   Plan / Recommendations   Stroke workup: awaiting - MRI ; repeat Head CT ; echo  Therapy Follow Up: pending  Disposition: pending  Antiplatelet / Anticoagulation: ASA 325 mg daily  (consider DAPT in future - hold for now 2/2 large stroke))  Statin: Lipitor 40 mg daily (may need to increase - LDL pending)  MD Follow Up: [facility] in 6-8 weeks  Other: consults include - CCM ; NSY; Vascular surgery for carotid stenosis   Further risk factor modification per primary care MD: Follow Up 2 weeks  Patient has remained neurologically fairly stable and may be outside the timeframe for maximum cerebral edema hence recommend tapering hypertonic saline over the next 24 hours and discontinuing. Check follow-up CT scan of the head tomorrow morning.No family at the bedside.institute alcohol withdrawal protocol given his history of heavy alcohol usage. Patient has not been cooperative to obtain an MRI and despite 3 attempts hence we will not try further.  Discussed with critical care MD.mobilize out of bed with physical occupational therapy.This patient is critically ill and at significant risk of neurological worsening, death and care requires constant monitoring  of vital signs, hemodynamics,respiratory and cardiac monitoring, extensive review of multiple databases, frequent neurological assessment, discussion with family, other specialists and medical decision making of high complexity.I have made any additions or clarifications directly to the above note.This critical care time does not reflect procedure time, or teaching time or  supervisory time of PA/NP/Med Resident etc but could involve care discussion time.  I spent 35 minutes of neurocritical care time  in the care of  this patient.      Delia Heady, MD Medical Director [facility] Pager: [phone] 07/31/2017 4:26 PM   To contact Stroke Continuity provider, please refer to [email or web address]. After hours, contact General Neurology

## 2017-07-31 NOTE — Progress Notes (Signed)
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 BP MAP slightly elevated between 120-125, RN notified, pt cleared to con't to participate with PT. Pt con't to be an excellent candidate for inpatient rehab upon d/c.   Follow Up Recommendations  CIR     Equipment Recommendations  Other (comment)    Recommendations for Other Services Rehab consult     Precautions / Restrictions Precautions Precautions: Fall Precaution Comments: L sided neglect Restrictions Weight Bearing Restrictions: No    Mobility  Bed Mobility               General bed mobility comments: pt received EOB with OT  Transfers Overall transfer level: Needs assistance   Transfers: Sit to/from Stand;Stand Pivot Transfers Sit to Stand: Mod assist;+2 physical assistance;Min assist Stand pivot transfers: Min assist;Mod assist;+2 physical assistance       General transfer comment: L knee blocked to prevent knee buckling, max directional v/c's to sequence std pvt transfer, pt able to power up well however with strong lean to the R  Ambulation/Gait Ambulation/Gait assistance: Mod assist;Max assist;+2 physical assistance(person for chair follow and IV pole) Ambulation Distance (Feet): 10 Feet Assistive device: (2 person 3 musketeer style with arms crossed in the back) Gait Pattern/deviations: Step-through pattern Gait velocity: slow Gait velocity interpretation: <1.8 ft/sec, indicate of risk for recurrent falls General Gait Details: pt with impaired L LE proprioception, adduction with stepping and further abducts R  foot to widen base of support due to feeling of falling down despite manual cues at hips and LEs for optimal gait mechanics/kinematics   Stairs             Wheelchair Mobility    Modified Rankin (Stroke Patients Only)       Balance Overall balance assessment: Needs assistance Sitting-balance support: No upper extremity supported;Feet supported Sitting balance-Leahy Scale: Good     Standing balance support: Bilateral upper extremity supported Standing balance-Leahy Scale: Poor Standing balance comment: dependent on physical assist                            Cognition Arousal/Alertness: Awake/alert Behavior During Therapy: Impulsive Overall Cognitive Status: Impaired/Different from baseline Area of Impairment: Awareness;Safety/judgement;Problem solving;Following commands                   Current Attention Level: Sustained   Following Commands: Follows one step commands with increased time;Follows multi-step commands inconsistently Safety/Judgement: Decreased awareness of deficits(L sided neglect) Awareness: Emergent Problem Solving: Slow processing;Difficulty sequencing;Requires verbal cues;Requires tactile cues General Comments: per OT pt with improved awareness of L side however con't to require v/c's to attend to L side      Exercises Other Exercises Other Exercises: focused on picking up L knee in sitting and placing foot on "x" on the floor to work on proprioception for stepping sequencing.    General Comments General comments (skin integrity, edema, etc.): assisted pt with opening of salad dressing packets. observed pt eating, pt taking inappropriate size bites and had noted food falling out of L side of mouth. Sister present to supervise  pt      Pertinent Vitals/Pain Pain Assessment: No/denies pain    Home Living                      Prior Function            PT Goals (current goals can now be found in the care plan  section) Progress towards PT goals: Progressing toward goals    Frequency    Min 4X/week      PT Plan Current plan remains appropriate    Co-evaluation PT/OT/SLP Co-Evaluation/Treatment: Yes Reason for Co-Treatment: To address functional/ADL transfers PT goals addressed during session: Mobility/safety with mobility        AM-PAC PT "6 Clicks" Daily Activity  Outcome Measure  Difficulty turning over in bed (including adjusting bedclothes, sheets and blankets)?: Unable Difficulty moving from lying on back to sitting on the side of the bed? : Unable Difficulty sitting down on and standing up from a chair with arms (e.g., wheelchair, bedside commode, etc,.)?: Unable Help needed moving to and from a bed to chair (including a wheelchair)?: A Lot Help needed walking in hospital room?: A Lot Help needed climbing 3-5 steps with a railing? : A Lot 6 Click Score: 9    End of Session Equipment Utilized During Treatment: Gait belt Activity Tolerance: Patient tolerated treatment well Patient left: in chair;with call bell/phone within reach;with chair alarm set;with family/visitor present Nurse Communication: Mobility status PT Visit Diagnosis: Unsteadiness on feet (R26.81);Hemiplegia and hemiparesis Hemiplegia - Right/Left: Left Hemiplegia - dominant/non-dominant: Non-dominant Hemiplegia - caused by: Cerebral infarction     Time: 1310-1349 PT Time Calculation (min) (ACUTE ONLY): 39 min  Charges:  $Gait Training: 8-22 mins $Neuromuscular Re-education: 8-22 mins                    G Codes:       Lewis Shock, PT, DPT Pager #: [phone] Office #: [phone]    Ashawna Hanback M Myrene Bougher 07/31/2017, 2:15 PM

## 2017-07-31 NOTE — Progress Notes (Signed)
Subjective: The patient is mildly somnolent but easily arousable.  He says his headache is about "the same".  Objective: Vital signs in last 24 hours: Temp:  [98 F (36.7 C)-98.8 F (37.1 C)] 98 F (36.7 C) (05/29 0400) Pulse Rate:  [60-102] 60 (05/29 0600) Resp:  [16-32] 16 (05/29 0600) BP: (117-184)/(76-171) 155/77 (05/29 0600) SpO2:  [94 %-100 %] 97 % (05/29 0600) Estimated body mass index is 27.99 kg/m as calculated from the following:   Height as of this encounter:  (1.651 m).   Weight as of this encounter: 76.3 kg (168 lb 3.2 oz).   Intake/Output from previous day: 05/28 0701 - 05/29 0700 In: 3334.2 [P.O.:1320; I.V.:2014.2] Out: 4364 [Urine:4364] Intake/Output this shift: No intake/output data recorded.  Physical exam the patient is mildly somnolent but easily arousable.  His pupils are equal.  He follows commands.  He remains densely hemiparetic, upper extremity greater than lower extremity without change.  He is oriented x3.  He gave me the thumbs up.  I reviewed the patient's follow-up head CT performed yesterday.  There is the expected occlusion of his right MCA infarction.  There is slightly more mass-effect and midline shift.  Lab Results: Recent Labs    07/29/17 0611  WBC 11.7*  HGB 16.1  HCT 47.7  PLT 205   BMET Recent Labs    07/29/17 0611  07/30/17 1804 07/31/17 0018  NA 146*   < > 145 145  K 3.9  --   --   --   CL 116*  --   --   --   CO2 23  --   --   --   GLUCOSE 108*  --   --   --   BUN 11  --   --   --   CREATININE 0.97  --   --   --   CALCIUM 8.1*  --   --   --    < > = values in this interval not displayed.    Studies/Results: Ct Head Wo Contrast  Result Date: 07/30/2017 CLINICAL DATA:  Stroke follow-up EXAM: CT HEAD WITHOUT CONTRAST TECHNIQUE: Contiguous axial images were obtained from the base of the skull through the vertex without intravenous contrast. COMPARISON:  07/28/2017 FINDINGS: Brain: There is no mass, hemorrhage or  extra-axial collection. Large right MCA territory infarct is again demonstrated with unchanged degree of cytotoxic edema causing mass effect on the right lateral ventricle. 4 mm of leftward midline shift. No hydrocephalus. Vascular: There is hyperdensity within the right MCA at the site of known thrombus. Skull: The visualized skull base, calvarium and extracranial soft tissues are normal. Sinuses/Orbits: No fluid levels or advanced mucosal thickening of the visualized paranasal sinuses. No mastoid or middle ear effusion. The orbits are normal. IMPRESSION: Unchanged examination of large right MCA territory infarct without acute hemorrhage. Electronically Signed   By: Deatra Robinson M.D.   On: 07/30/2017 02:20   Dg Chest Port 1 View  Result Date: 07/29/2017 CLINICAL DATA:  Central line placement. EXAM: PORTABLE CHEST 1 VIEW COMPARISON:  07/28/2017 FINDINGS: Left subclavian central venous line has its tip projecting within the mid superior vena cava. No pneumothorax. Lungs are clear. Heart, mediastinum and hila are unremarkable. IMPRESSION: 1. Left subclavian central venous line tip projects in the mid superior vena cava. 2. No pneumothorax.  No acute cardiopulmonary disease. Electronically Signed   By: Amie Portland M.D.   On: 07/29/2017 14:23    Assessment/Plan: Right MCA infarction:  The patient continues to be clinically stable.  We will continue with medical management.  If he should worsen we could consider a decompressive hemicraniectomy.  LOS: 4 days     Cristi Loron 07/31/2017, 7:37 AM

## 2017-07-31 NOTE — Progress Notes (Signed)
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 Pt reports "my left side doesn't work" Pt demonstrates transfer this session. BP monitored closely this session due to elevated compared to previous sessions. Pt with a constant MAP 120 during session.    Follow Up Recommendations  CIR    Equipment Recommendations  Other (comment)    Recommendations for Other Services Rehab consult    Precautions / Restrictions Precautions Precautions: Fall Precaution Comments: L sided neglect Restrictions Weight Bearing Restrictions: No       Mobility Bed Mobility Overal bed mobility: Needs Assistance Bed Mobility: Supine to Sit     Supine to sit: Min assist     General bed mobility comments: pt requires cues to initiate but able to progress to eob with min (A) to help with L trunk rotation only  Transfers Overall transfer level: Needs assistance   Transfers: Sit to/from Stand;Stand Pivot Transfers Sit to Stand: Mod assist;+2 physical assistance;Min assist Stand pivot transfers: Min assist;Mod assist;+2 physical assistance       General transfer comment: L knee blocked to prevent knee buckling, max directional v/c's to sequence std pvt transfer, pt able to power up well however with strong lean to the R    Balance Overall balance assessment: Needs assistance Sitting-balance support: No upper extremity supported;Feet supported Sitting balance-Leahy Scale: Good     Standing balance support: Bilateral upper extremity supported Standing balance-Leahy Scale: Poor Standing balance comment: dependent on physical assist                           ADL either performed  or assessed with clinical judgement   ADL Overall ADL's : Needs assistance/impaired   Eating/Feeding Details (indicate cue type and reason): lunch arriving during session.                 Lower Body Dressing: Moderate assistance(sitting EOB to don socks) Lower Body Dressing Details (indicate cue type and reason): pt able to place R sock on Le with (A) to hook big toe. pt requires total (A) for L LE Toilet Transfer: +2 for physical assistance;Minimal assistance Toilet Transfer Details (indicate cue type and reason): pt able to progress bil LE with ataxic L LE movement. pt scissoring                 Vision   Vision Assessment?: Vision impaired- to be further tested in functional context   Perception     Praxis      Cognition Arousal/Alertness: Awake/alert Behavior During Therapy: Impulsive Overall Cognitive Status: Impaired/Different from baseline Area of Impairment: Awareness;Safety/judgement;Problem solving;Following commands                   Current Attention Level: Sustained   Following Commands: Follows one step commands with increased time;Follows multi-step commands inconsistently Safety/Judgement: Decreased awareness of deficits(L sided neglect) Awareness: Emergent Problem Solving: Slow processing;Difficulty sequencing;Requires verbal cues;Requires tactile cues General Comments: pt scanning to L with name call this session which is improved from previous sessions. pt able to verbalize "L side dont work" when asked to describe deficits this session. pt shows social awareness for appropriate behavior during  session with females. pt expressed having a headache and eager to get oob today        Exercises Exercises: Other exercises Other Exercises Other Exercises: pt weight bearing through L UE with transfers to sit<>stand. pt with good practice for transfer to place L UE with R UE to protect extremity   Shoulder Instructions       General Comments  assisted pt with opening of salad dressing packets. observed pt eating, pt taking inappropriate size bites and had noted food falling out of L side of mouth. Sister present to supervise pt    Pertinent Vitals/ Pain       Pain Assessment: No/denies pain  Home Living                                          Prior Functioning/Environment              Frequency  Min 3X/week        Progress Toward Goals  OT Goals(current goals can now be found in the care plan section)  Progress towards OT goals: Progressing toward goals  Acute Rehab OT Goals Patient Stated Goal: to get oob OT Goal Formulation: With patient Time For Goal Achievement: 08/12/17 Potential to Achieve Goals: Good ADL Goals Pt Will Perform Grooming: with min guard assist;sitting Pt Will Perform Upper Body Bathing: with min assist;sitting Pt Will Transfer to Toilet: with mod assist;regular height toilet;stand pivot transfer Additional ADL Goal #1: Pt will complete bed mobilty supervision as precursor to adls  Plan Discharge plan remains appropriate    Co-evaluation    PT/OT/SLP Co-Evaluation/Treatment: Yes Reason for Co-Treatment: Complexity of the patient's impairments (multi-system involvement);Necessary to address cognition/behavior during functional activity;For patient/therapist safety;To address functional/ADL transfers PT goals addressed during session: Mobility/safety with mobility OT goals addressed during session: ADL's and self-care;Proper use of Adaptive equipment and DME;Strengthening/ROM      AM-PAC PT "6 Clicks" Daily Activity     Outcome Measure   Help from another person eating meals?: A Little Help from another person taking care of personal grooming?: A Little Help from another person toileting, which includes using toliet, bedpan, or urinal?: A Lot Help from another person bathing (including washing, rinsing, drying)?: A Lot Help from another person to put on and taking  off regular upper body clothing?: A Lot Help from another person to put on and taking off regular lower body clothing?: A Lot 6 Click Score: 14    End of Session Equipment Utilized During Treatment: Gait belt  OT Visit Diagnosis: Unsteadiness on feet (R26.81);Muscle weakness (generalized) (M62.81);Other symptoms and signs involving cognitive function;Hemiplegia and hemiparesis Hemiplegia - Right/Left: Left Hemiplegia - dominant/non-dominant: Non-Dominant Hemiplegia - caused by: Cerebral infarction   Activity Tolerance Patient tolerated treatment well   Patient Left in chair;with call bell/phone within reach;Other (comment)(PT Ashly with patient setting up lunch)   Nurse Communication Mobility status;Precautions        Time: 1191-4782 OT Time Calculation (min): 33 min  Charges: OT General Charges $OT Visit: 1 Visit OT Treatments $Therapeutic Activity: 8-22 mins   Mateo Flow   OTR/L Pager: [phone] Office: [phone] .    Boone Master B 07/31/2017, 2:45 PM

## 2017-08-01 ENCOUNTER — Inpatient Hospital Stay (HOSPITAL_COMMUNITY): Payer: Managed Care, Other (non HMO)

## 2017-08-01 DIAGNOSIS — I63411 Cerebral infarction due to embolism of right middle cerebral artery: Secondary | ICD-10-CM

## 2017-08-01 DIAGNOSIS — G8194 Hemiplegia, unspecified affecting left nondominant side: Secondary | ICD-10-CM

## 2017-08-01 LAB — SODIUM
SODIUM: 142 mmol/L (ref 135–145)
SODIUM: 142 mmol/L (ref 135–145)
SODIUM: 144 mmol/L (ref 135–145)
SODIUM: 145 mmol/L (ref 135–145)

## 2017-08-01 NOTE — Consult Note (Signed)
Physical Medicine and Rehabilitation Consult   Reason for Consult: Functional deficits due to stroke Referring Physician: Dr. Pearlean Brownie   HPI: [name] [name] is a 52 y.o. male with history of HTN, tobacco abuse, alcohol abuse, GERD who was admitted on 07/27/17 with left sided weakness that started early in am but patient refused to come to ED. UDS positive for cocaine. CTA/perfusion showed large MCA infarct with high grade near occlusive stenosis of R-ICA at bifurcation and occluded right M1 segment with poor collaterals. 2 D echo showed EF 55-60% with grade 2 diastolic dysfunction and no PFO seen. He had decline in MS after admission requiring transfer to ICU. Dr. Imogene Burn consulted for input and felt that immediate surgical intervention likely not helpful with concerns of hemorrhagic conversion. Follow up CT head showed mild mass effect and Dr. Lovell Sheehan recommended hypertonic saline for treatment. CT head today showed stable infarct with increase in mass effect and 7 mm right to left midline shift and effacement of right lateral ventricle. Mentation has improved and therapy evaluations done revealing left inattention, dense left sided weakness and difficulty with mobility. CIR recommended due to functional deficits.    Review of Systems  Constitutional: Negative for chills and fever.  HENT: Negative for hearing loss and tinnitus.   Eyes: Negative for blurred vision and double vision.  Respiratory: Positive for cough (ongoing for past few months. ) and sputum production. Negative for shortness of breath.   Cardiovascular: Negative for chest pain and palpitations.  Gastrointestinal: Positive for heartburn. Negative for abdominal pain and constipation.  Genitourinary: Negative for dysuria and urgency.  Musculoskeletal: Negative for back pain, joint pain and myalgias.  Skin: Negative for rash.  Neurological: Positive for focal weakness. Negative for dizziness and headaches.    Psychiatric/Behavioral: The patient is not nervous/anxious and does not have insomnia.       Past Medical History:  Diagnosis Date  . GERD (gastroesophageal reflux disease)   . Hypertension   . Nephrolithiasis   . Tobacco abuse     Past Surgical History:  Procedure Laterality Date  . CYSTOSCOPY W/ URETERAL STENT PLACEMENT Bilateral 09/07/2013   Procedure: CYSTOSCOPY WITH RETROGRADE PYELOGRAM/URETERAL STENT PLACEMENT;  Surgeon: Sebastian Ache, MD;  Location: [facility];  Service: Urology;  Laterality: Bilateral;  . CYSTOSCOPY WITH RETROGRADE PYELOGRAM, URETEROSCOPY AND STENT PLACEMENT Bilateral 09/09/2013   Procedure: CYSTOSCOPY WITH RETROGRADE PYELOGRAM, DIAGNOSTIC URETEROSCOPY AND STENT CHANGE;  Surgeon: Sebastian Ache, MD;  Location: [facility];  Service: Urology;  Laterality: Bilateral;  . HOLMIUM LASER APPLICATION Right 09/07/2013   Procedure: HOLMIUM LASER APPLICATION;  Surgeon: Sebastian Ache, MD;  Location: [facility];  Service: Urology;  Laterality: Right;  . HOLMIUM LASER APPLICATION Right 09/09/2013   Procedure: HOLMIUM LASER APPLICATION;  Surgeon: Sebastian Ache, MD;  Location: [facility];  Service: Urology;  Laterality: Right;  . NEPHROLITHOTOMY Right 09/07/2013   Procedure: FIRST STAGE RIGHT PERCUTANEOUS NEPHROLITHOTOMY  WITH ACCESS, ;  Surgeon: Sebastian Ache, MD;  Location: [facility];  Service: Urology;  Laterality: Right;  . NEPHROLITHOTOMY Right 09/09/2013   Procedure: RIGHT 2ND STAGE NEPHROLITHOTOMY PERCUTANEOUS;  Surgeon: Sebastian Ache, MD;  Location: [facility];  Service: Urology;  Laterality: Right;  . UMBILICAL HERNIA REPAIR      Family History  Problem Relation Age of Onset  . Prostate cancer Father 22  . Lung cancer Father   . Nephrolithiasis Father   . Ovarian cancer Mother   . CVA Paternal Grandmother  in her 75's  . Cancer Paternal Grandmother        eye  . Diabetes Maternal Grandmother   . Alcoholism Maternal Uncle        x 3    Social History:  Lives alone and  independent PTA--[employer] in [employer]. Has a sister in town who works. He reports that he has been smoking cigarettes.  He has a 25.00 pack-year smoking history. He has never used smokeless tobacco. He reports that he drinks alcohol --a pint of whiskey daily. He reports that he has current or past drug history. Drug: Marijuana.    Allergies: No Known Allergies    Medications Prior to Admission  Medication Sig Dispense Refill  . lisinopril-hydrochlorothiazide (PRINZIDE,ZESTORETIC) 20-25 MG tablet Take 1 tablet by mouth daily.    . hydrocortisone (ANUSOL-HC) 25 MG suppository Place 1 suppository (25 mg total) rectally at bedtime. For 5 nights then as needed for hemorrhoids (Patient not taking: Reported on 07/28/2017) 24 suppository 0  . HYDROmorphone (DILAUDID) 2 MG tablet Take 1 tablet (2 mg total) by mouth every 4 (four) hours as needed for moderate pain or severe pain. Post-operatively (Patient not taking: Reported on 07/28/2017) 30 tablet 0  . senna-docusate (SENOKOT-S) 8.6-50 MG per tablet Take 1 tablet by mouth 2 (two) times daily. 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 sister providing details of home setup .  has two sisters and  joan brown is a neighbor that helps take care of his mobile home when he is away for work  Lives With: Alone  Functional History: Prior Function Level of Independence:  Independent Functional Status:  Mobility: Bed Mobility Overal bed mobility: Needs Assistance Bed Mobility: Supine to Sit Supine to sit: Min assist Sit to supine: Mod assist General bed mobility comments: pt requires cues to initiate but able to progress to eob with min (A) to help with L trunk rotation only Transfers Overall transfer level: Needs assistance Transfers: Sit to/from Stand, Stand Pivot Transfers Sit to Stand: Mod assist, +2 physical assistance, Min assist Stand pivot transfers: Min assist, Mod assist, +2 physical assistance  Lateral/Scoot Transfers: Min assist, +2 safety/equipment General transfer comment: L knee blocked to prevent knee buckling, max directional v/c's to sequence std pvt transfer, pt able to power up well however with strong lean to the R Ambulation/Gait Ambulation/Gait assistance: Mod assist, Max assist, +2 physical assistance(person for chair follow and IV pole) Ambulation Distance (Feet): 10 Feet Assistive device: (2 person 3 musketeer style with arms crossed in the back) Gait Pattern/deviations: Step-through pattern General Gait Details: pt with impaired L LE proprioception, adduction with stepping and further abducts R foot to widen base of support due to feeling of falling down despite manual cues at hips and LEs for optimal gait mechanics/kinematics Gait velocity: slow Gait velocity interpretation: <1.8 ft/sec, indicate of risk for recurrent falls    ADL: ADL Overall ADL's : Needs assistance/impaired Eating/Feeding: Set up Eating/Feeding Details (indicate cue type and reason): lunch arriving during session.  Grooming: Set up Upper Body Bathing: Moderate assistance Lower Body Bathing: Maximal assistance Lower Body Dressing: Moderate assistance(sitting EOB to don socks) Lower Body Dressing Details (indicate cue type and reason): pt able to place R sock on Le with (A) to hook big toe. pt requires total (A) for L LE Toilet Transfer: +2 for physical  assistance, Minimal assistance Toilet Transfer Details (indicate cue type and reason): pt able to progress bil LE with ataxic L LE movement. pt scissoring Functional mobility during ADLs: +2 for physical assistance, Minimal assistance General ADL Comments: pt with scissoring and narrowed gait with transfer. pt needed max cues for sequence and L LE placement/ safety. pt needs faciliation of R weight shift to advance LLE  Cognition: Cognition Overall Cognitive Status: Impaired/Different from baseline Arousal/Alertness: Awake/alert Orientation Level: Oriented X4 Attention: Selective, Sustained Sustained Attention: Impaired Sustained Attention Impairment: Verbal complex, Functional complex Selective Attention: Impaired Selective Attention Impairment: Verbal complex, Functional complex Memory: Impaired Memory Impairment: Retrieval deficit, Decreased recall of new information, Decreased short term memory Decreased Short Term Memory: Verbal basic, Functional basic Awareness: Impaired Awareness Impairment: Emergent impairment, Anticipatory impairment, Intellectual impairment Problem Solving: Impaired Problem Solving Impairment: Verbal basic, Functional basic Executive Function: Reasoning, Organizing, Decision Making, Initiating, Self Monitoring, Self Correcting Reasoning: Impaired Reasoning Impairment: Verbal basic, Functional basic Organizing: Impaired Organizing Impairment: Verbal basic, Functional basic Decision Making: Impaired Decision Making Impairment: Verbal basic, Functional basic Initiating: Impaired Initiating Impairment: Verbal basic, Functional basic Self Monitoring: Impaired Self Monitoring Impairment: Verbal basic, Functional basic Self Correcting: Impaired Self Correcting Impairment: Verbal basic, Functional basic Behaviors: Restless, Impulsive, Poor frustration tolerance Safety/Judgment: Impaired Cognition Arousal/Alertness: Awake/alert Behavior During Therapy:  Impulsive Overall Cognitive Status: Impaired/Different from baseline Area of Impairment: Awareness, Safety/judgement, Problem solving, Following commands Current Attention Level: Sustained Following Commands: Follows one step commands with increased time, Follows multi-step commands inconsistently Safety/Judgement: Decreased awareness of deficits(L sided neglect) Awareness: Emergent Problem Solving: Slow processing, Difficulty sequencing, Requires verbal cues, Requires tactile cues General Comments: pt scanning to L with name call this session which is improved from previous sessions. pt able to verbalize "L side dont work" when asked to describe deficits this session. pt shows social awareness for appropriate behavior during session with females. pt expressed having a headache and eager to get oob today   Blood pressure (!) 153/91, pulse 68, temperature 97.8 F (36.6 C), temperature source Oral, resp. rate (!) 21, height  (1.651 m), weight 76.3 kg (168 lb 3.2 oz), SpO2 95 %. Physical Exam  Nursing note and vitals reviewed. Constitutional: He appears well-developed.  HENT:  Head: Normocephalic.  Eyes: Pupils are equal, round, and reactive to light.  Neck: Normal range of motion.  Cardiovascular: Normal rate.  Respiratory: Effort normal.  GI: Soft.  Neurological:  Mild left facial weakness without dysarthria or pocketing. Left inattention but able to scan to the left with minimal cues. Able to move eyes to left field on testing.Left hemiparesis.  LUE tr to 0/5. LLE 1-2/5 prox to distal with decreased engagement on left in general. Does sense pain and gross touch in left arm and leg.  Psychiatric:  Flat, hard to engage    Results for orders placed or performed during the hospital encounter of 07/27/17 (from the past 24 hour(s))  Sodium     Status: None   Collection Time: 07/31/17 12:30 PM  Result Value Ref Range   Sodium 144 135 - 145 mmol/L  Sodium     Status: Abnormal    Collection  Time: 07/31/17  6:44 PM  Result Value Ref Range   Sodium 146 (H) 135 - 145 mmol/L  Sodium     Status: None   Collection Time: 08/01/17 12:04 AM  Result Value Ref Range   Sodium 145 135 - 145 mmol/L  Sodium     Status: None   Collection Time: 08/01/17  6:13 AM  Result Value Ref Range   Sodium 144 135 - 145 mmol/L   Ct Head Wo Contrast  Result Date: 08/01/2017 CLINICAL DATA:  52 y/o  M; follow-up of stroke. EXAM: CT HEAD WITHOUT CONTRAST TECHNIQUE: Contiguous axial images were obtained from the base of the skull through the vertex without intravenous contrast. COMPARISON:  07/30/2017 CT head.  07/27/2017 CT angiogram head. FINDINGS: Brain: Stable distribution of large right MCA infarction. Increased edema and mass effect with 7 mm right-to-left midline shift, previously 4 mm. Effacement of the right lateral ventricle. Stable size of left lateral ventricle. No herniation. No new focus of acute stroke, hemorrhage, or focal mass effect of the brain. Vascular: Increased density within the right middle cerebral artery, probably thrombus. Skull: Normal. Negative for fracture or focal lesion. Sinuses/Orbits: Partial opacification of left mastoid air cells. Right anterior ethmoid air cell mucous retention cyst and mild mucosal thickening of the left maxillary sinus. Orbits are unremarkable. Other: None. IMPRESSION: 1. Large right MCA infarction is stable in distribution but increased in edema/mass effect. 7 mm of right-to-left midline shift, previously 4 mm. 2. No new acute stroke, hemorrhage, or focal mass effect. Electronically Signed   By: Mitzi Hansen M.D.   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 Does the need for close, 24 hr/day medical supervision in concert with the patient's rehab needs make it unreasonable for this patient to be served in a less intensive setting? Yes 2. Co-Morbidities requiring  supervision/potential complications: htn, post-stroke sequelae 3. Due to bladder management, bowel management, safety, skin/wound care, disease management, medication administration and patient education, does the patient require 24 hr/day rehab nursing? Yes 4. Does the patient require coordinated care of a physician, rehab nurse, PT (1-2 hrs/day, 5 days/week), OT (1-2 hrs/day, 5 days/week) and SLP (1-2 hrs/day, 5 days/week) to address physical and functional deficits in the context of the above medical diagnosis(es)? Yes Addressing deficits in the following areas: balance, endurance, locomotion, strength, transferring, bowel/bladder control, bathing, dressing, feeding, grooming, toileting, cognition, speech, swallowing and psychosocial support 5. Can the patient actively participate in an intensive therapy program of at least 3 hrs of therapy per day at least 5 days per week? Yes 6. The potential for patient to make measurable gains while on inpatient rehab is excellent 7. Anticipated functional outcomes upon discharge from inpatient rehab are modified independent and supervision  with PT, modified independent, supervision and min assist with OT, modified independent with SLP. 8. Estimated rehab length of stay to reach the above functional goals is: 18-24 days 9. Anticipated D/C setting: Home 10. Anticipated post D/C treatments: HH therapy and Outpatient therapy 11. Overall Rehab/Functional Prognosis: excellent  RECOMMENDATIONS: This patient's condition is appropriate for continued rehabilitative care in the following setting: CIR Patient has agreed to participate in recommended program. Yes Note that insurance prior authorization may be required for reimbursement for recommended care.  Comment: Rehab Admissions Coordinator to follow up.  Thanks,  Ranelle Oyster, MD, [place] [facility]  I have personally performed a face to face diagnostic evaluation of this patient. Additionally,  I have reviewed and  concur with the physician assistant's documentation above.    Jacquelynn Cree, PA-C 08/01/2017

## 2017-08-01 NOTE — Progress Notes (Signed)
Inpatient Rehabilitation  Met with patient at bedside and sister, Helene Kelp via phone to discuss team's recommendation for IP Rehab.  Shared booklets, discussed anticipated outcomes, and answered initial questions.  Helene Kelp is going to discuss anticipated needs with family and plans to follow up with me Monday, 08/05/17 regarding the above.  I will initiate insurance authorization today and do not anticipate a decision from [facility] prior to 08/05/17.  Plan to follow for timing of medical readiness, insurance approval, patient/family decision, and IP Rehab bed availability.  Call if questions.    Carmelia Roller., CCC/SLP Admission Coordinator  [place]  Cell [phone]

## 2017-08-01 NOTE — Progress Notes (Signed)
STROKE TEAM PROGRESS NOTE    SUBJECTIVE (INTERVAL HISTORY) No family members present. The patient remains alert and follows commands. He remains plegic on the left.  Serum sodium is yet 142 and 35 drip is being tapered. Repeat CT head shows slightly worsening of midline shift but clinically he is unchanged.    OBJECTIVE Temp:  [97.8 F (36.6 C)-98.8 F (37.1 C)] 98.7 F (37.1 C) (05/30 1308) Pulse Rate:  [54-97] 80 (05/30 1400) Cardiac Rhythm: Normal sinus rhythm (05/30 0800) Resp:  [13-24] 19 (05/30 1400) BP: (130-179)/(77-120) 179/100 (05/30 1400) SpO2:  [95 %-100 %] 97 % (05/30 1400)  CBC:  Recent Labs  Lab 07/27/17 2025  07/28/17 0254 07/29/17 0611  WBC 16.1*  --  14.1* 11.7*  NEUTROABS 11.9*  --   --   --   HGB 18.0*   < > 18.3* 16.1  HCT 52.2*   < > 52.1* 47.7  MCV 97.9  --  99.0 101.3*  PLT 254  --  234 205   < > = values in this interval not displayed.    Basic Metabolic Panel:  Recent Labs  Lab 07/27/17 2025 07/27/17 2036 07/28/17 0254  07/29/17 0611  08/01/17 0613 08/01/17 1204  NA 137 141  --    < > 146*   < > 144 142  K 3.7 3.7  --   --  3.9  --   --   --   CL 102 103  --   --  116*  --   --   --   CO2 25  --   --   --  23  --   --   --   GLUCOSE 120* 118*  --   --  108*  --   --   --   BUN 11 12  --   --  11  --   --   --   CREATININE 1.22 1.00 1.12  --  0.97  --   --   --   CALCIUM 9.6  --   --   --  8.1*  --   --   --   MG  --   --   --   --  2.2  --   --   --    < > = values in this interval not displayed.    Lipid Panel:     Component Value Date/Time   CHOL 172 07/28/2017 0848   TRIG 99 07/28/2017 0848   HDL 46 07/28/2017 0848   CHOLHDL 3.7 07/28/2017 0848   VLDL 20 07/28/2017 0848   LDLCALC 106 (H) 07/28/2017 0848   HgbA1c:  Lab Results  Component Value Date   HGBA1C 5.0 07/28/2017   Urine Drug Screen:     Component Value Date/Time   LABOPIA NONE DETECTED 07/28/2017 1700   COCAINSCRNUR POSITIVE (A) 07/28/2017 1700   LABBENZ  NONE DETECTED 07/28/2017 1700   AMPHETMU NONE DETECTED 07/28/2017 1700   THCU NONE DETECTED 07/28/2017 1700   LABBARB NONE DETECTED 07/28/2017 1700    Alcohol Level No results found for: ETH  IMAGING  Ct Angio Head W Or Wo Contrast Ct Angio Neck W Or Wo Contrast Ct Cerebral Perfusion W Contrast 07/27/2017 IMPRESSION:  1. Large right MCA territory infarct.  CBF< 30% is 69 mL.  2. Hypoperfusion index of 0.6 consistent with very poor collaterals and rapidly progressive infarct.  3. High-grade, critical, near occlusive, stenosis of the right internal carotid artery 10  mm from the bifurcation.  4. Marked decreased caliber of the cervical right internal carotid artery distal to the stenosis.  5. Reconstitution of the right internal carotid artery at the ophthalmic segment with some filling of the fetal type right posterior cerebral artery.  6. Occluded right M1 segment with poor collaterals.  7. Atherosclerotic changes at the left carotid bifurcation without significant stenosis.  8. Left MCA and ACA vessels are within normal limits.  9. Small basilar artery feeds the left posterior cerebral artery.    Dg Chest 2 View 07/28/2017 IMPRESSION:  No active cardiopulmonary disease.    Dg Foot 2 Views Left 07/27/2017 IMPRESSION:  Negative.    Ct Head Code Stroke Wo Contrast 07/27/2017 IMPRESSION:  1. Large right MCA territory infarct involving the insular cortex, lentiform nucleus, anterior limb internal capsule, caudate head, and right temporal lobe. This results in some mass effect with effacement of the sulci and right lateral ventricle but no hemorrhage.  2. Hyperdense right MCA extending beyond the bifurcation compatible with thrombus.  3. Mild diffuse subcortical white matter hypoattenuation bilaterally likely reflects microvascular ischemia.  4. ASPECTS is 4/10    CT Head Wo Contrast - stable appearance of the 4 mm right left midline shift and cytotoxic edema. 07/30/2017   MRI  Brain Wo Contrast - pending Not performed at time of admission. Three attempts made; however patient was too combative.     Transthoracic Echocardiogram -Left ventricle: The cavity size was normal. There was mild focal   basal hypertrophy of the septum. Systolic function was normal.The estimated ejection fraction was in the range of 55% to 60%. Wall motion was normal; there were no regional wall motion abnormality      PHYSICAL EXAM Vitals:   08/01/17 1200 08/01/17 1300 08/01/17 1308 08/01/17 1400  BP: (!) 146/99 (!) 164/107  (!) 179/100  Pulse:  71  80  Resp: Temp:   98.7 F (37.1 C)   TempSrc:      SpO2: 100% 97%  97%  Weight:      Height:        General -  Heart - Regular rate and rhythm - no murmer appreciated Lungs - Clear to auscultation anteriorly Abdomen - Soft - non tender Extremities - Distal pulses intact - no edema Skin - Warm and dry  Mental Status: Drowsy but can be aroused easily   Speech fluent without evidence of aphasia.  Able to follow simple commands Cranial Nerves: II: Discs not visualized; hemianopia III,IV, ZO:XWRUE gaze preference but able to look to left past midline, PERRLdecrease blink to threat on left V,VII: dense left facial droop with decreased sensation left VIII: hearing normal to voice IX,X: cough and gag intact, protecting his own airway XI: bilateral shoulder shrug intact. XII: tongue deviated Motor: Dense left arm weakness, withdraws left LE Decreased tone on the left Sensory: decreased sensation on the left Plantars: Right: downgoing   Left: upgoing Cerebellar: normal finger-to-nose right, unable on the left due to weakness Gait: not tested     ASSESSMENT/PLAN [name] [name] is a 52 y.o. male with history of tobacco use, hypertension,and medical noncompliance presenting with left hemiplegia. He did not receive IV t-PA due to unknown time of onset.  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  Rt MCA compatible with thrombus.   Repeat Head CT - pending  Carotid Doppler - CTA neck  2D Echo -normal EF  LDL - 106  HgbA1c - 5.0  VTE prophylaxis - Lovenox / SCDs Diet Order           Diet Heart Room service appropriate? Yes; Fluid consistency: Thin  Diet effective now          No antithrombotic prior to admission, now on aspirin 325 mg daily  Patient counseled to be compliant with his antithrombotic medications  Ongoing aggressive stroke risk factor management  Therapy recommendations:  pending  Disposition:  Pending  Hypertension  Stable - systolic blood pressure goal at this time is 120 to 180 (severe carotid stenosis with large stroke) . Long-term BP goal normotensive  Hyperlipidemia  Lipid lowering medication PTA:  none  LDL 106, goal < 70  Current lipid lowering medication: now on Lipitor 40 mg daily  Continue statin at discharge    Hypertonic Saline 3% (started 07/28/2017)  Na - 137->141->146  Repeat Head CT - Tuesday AM 5/28  Central venous catheter 07/29/2017 Dr Wallace Cullens    Other Stroke Risk Factors  Cigarette smoker - advised to stop smoking  ETOH use, advised to drink no more than 1 alcoholic beverage per day.  Overweight, Body mass index is 27.99 kg/m., recommend weight loss, diet and exercise as appropriate   Family hx stroke (grandmother had stroke in her 73s)  Substance abuse   Other Active Problems  Pancytosis  Combativeness - resolved - possibly drug related  UDS - positive for  cocaine  Severe R ICA stenosis - Dr Imogene Burn consulted - no surgery recommended at that time however patient has shown some improvement.  NS - Dr Lovell Sheehan following - surgery not indicated at this time  CCM on board   Plan / Recommendations   Stroke workup: awaiting - MRI ; repeat Head CT ; echo  Therapy Follow Up: pending  Disposition: pending  Antiplatelet / Anticoagulation: ASA 325 mg daily  (consider DAPT in future - hold for now 2/2 large stroke))  Statin: Lipitor 40 mg daily (may need to increase - LDL pending)  MD Follow Up: [facility] in 6-8 weeks  Other: consults include - CCM ; NSY; Vascular surgery for carotid stenosis   Further risk factor modification per primary care MD: Follow Up 2 weeks   There is discrepancy between patient's clinical exam and CT scan findings hence we'll continue to taper hypertonic saline and observed the patient one more day in the ICU. If there is clinical worsening may need to restart hypertonic saline otherwise transfer to the floor tomorrow and rehabilitation  Discussed with critical care MD.and rehabilitation coordinator. .This patient is critically ill and at significant risk of neurological worsening, death and care requires constant monitoring of vital signs, hemodynamics,respiratory and cardiac monitoring, extensive review of multiple databases, frequent neurological assessment, discussion with family, other specialists and medical decision making of high complexity.I have made any additions or clarifications directly to the above note.This critical care time does not reflect procedure time, or teaching time or supervisory time of PA/NP/Med Resident etc but could involve care discussion time.  I  spent 35 minutes of neurocritical care time  in the care of  this patient.      Delia Heady, MD Medical Director [facility] Pager: [phone] 08/01/2017 5:19 PM   To contact Stroke Continuity provider, please  refer to [email or web address]. After hours, contact General Neurology

## 2017-08-01 NOTE — Progress Notes (Signed)
Physical Therapy Treatment Patient Details Name: [name] [name] MRN: [id number] DOB: 19-Feb-1966 Today's Date: 08/01/2017    History of Present Illness 52 yo male with PMH significant for HTN, tobacoo abuse admitted with R MCA infarct    PT Comments    Pt is making steady improvement, but needs cuing for safety and redirection plus assist for truncal stability and L LE facilitation and support,    Follow Up Recommendations  CIR     Equipment Recommendations  Rolling walker with 5" wheels    Recommendations for Other Services Rehab consult     Precautions / Restrictions Precautions Precautions: Fall Precaution Comments: l sided inattension still present but appears to be improving    Mobility  Bed Mobility Overal bed mobility: Needs Assistance Bed Mobility: Supine to Sit     Supine to sit: Min assist     General bed mobility comments: pt still trying to come up OOB without fully getting his left leg off the bed, needingleft leg assist and truncal stability until he squares on EOB  Transfers Overall transfer level: Needs assistance Equipment used: Rolling walker (2 wheeled) Transfers: Sit to/from Stand Sit to Stand: Mod assist;Min assist;+2 physical assistance(improved with repetition)         General transfer comment: cues for hand placement and to scoot to EOB/chair.  Assist with support L LE and trunk.  Ambulation/Gait Ambulation/Gait assistance: Mod assist;Max assist;+2 physical assistance;+2 safety/equipment Ambulation Distance (Feet): 40 Feet(with multiple standing breaks to regroup) Assistive device: Rolling walker (2 wheeled);1 person hand held assist Gait Pattern/deviations: Step-to pattern;Decreased step length - right;Decreased stance time - left Gait velocity: slow Gait velocity interpretation: <1.31 ft/sec, indicative of household ambulator General Gait Details: pt needed R w/shift assist, trunk control and occasional L LE with l leg swing and  placement.  Gave pt input to extend L knee in stance and help translating hips forward.   Stairs             Wheelchair Mobility    Modified Rankin (Stroke Patients Only) Modified Rankin (Stroke Patients Only) Pre-Morbid Rankin Score: No symptoms Modified Rankin: Moderately severe disability     Balance Overall balance assessment: Needs assistance Sitting-balance support: No upper extremity supported;Feet supported Sitting balance-Leahy Scale: Fair Sitting balance - Comments: pt needed more set up help today and kept moving outside BOS and losing balance backward.   Standing balance support: Bilateral upper extremity supported Standing balance-Leahy Scale: Poor Standing balance comment: dependent on physical assist                            Cognition Arousal/Alertness: Awake/alert Behavior During Therapy: Impulsive Overall Cognitive Status: Impaired/Different from baseline                     Current Attention Level: Sustained   Following Commands: Follows one step commands with increased time;Follows multi-step commands inconsistently Safety/Judgement: Decreased awareness of deficits   Problem Solving: Requires verbal cues;Slow processing;Requires tactile cues General Comments: pt finding targets on the left with more frequency and with less cuing.      Exercises      General Comments General comments (skin integrity, edema, etc.): vss      Pertinent Vitals/Pain Pain Assessment: No/denies pain    Home Living                      Prior Function  PT Goals (current goals can now be found in the care plan section) Acute Rehab PT Goals Patient Stated Goal: to get oob PT Goal Formulation: With patient Time For Goal Achievement: 08/12/17 Potential to Achieve Goals: Good Progress towards PT goals: Progressing toward goals    Frequency    Min 4X/week      PT Plan Current plan remains appropriate     Co-evaluation              AM-PAC PT "6 Clicks" Daily Activity  Outcome Measure  Difficulty turning over in bed (including adjusting bedclothes, sheets and blankets)?: Unable Difficulty moving from lying on back to sitting on the side of the bed? : Unable Difficulty sitting down on and standing up from a chair with arms (e.g., wheelchair, bedside commode, etc,.)?: Unable Help needed moving to and from a bed to chair (including a wheelchair)?: A Lot Help needed walking in hospital room?: A Lot Help needed climbing 3-5 steps with a railing? : A Lot 6 Click Score: 9    End of Session   Activity Tolerance: Patient tolerated treatment well Patient left: in chair;with call bell/phone within reach;with chair alarm set Nurse Communication: Mobility status PT Visit Diagnosis: Unsteadiness on feet (R26.81);Hemiplegia and hemiparesis Hemiplegia - Right/Left: Left Hemiplegia - dominant/non-dominant: Non-dominant Hemiplegia - caused by: Cerebral infarction     Time: 1135-1207 PT Time Calculation (min) (ACUTE ONLY): 32 min  Charges:  $Gait Training: 8-22 mins $Therapeutic Activity: 8-22 mins                    G Codes:       2017-08-19  [place] Bing, PT [phone]  (pager)   Eliseo Gum Keydi Giel 08-19-17, 5:50 PM

## 2017-08-01 NOTE — Progress Notes (Signed)
Subjective:  The patient is alert and pleasant. He tells me his headache is better. He says he is doing "good".  Objective: Vital signs in last 24 hours: Temp:  [97.8 F (36.6 C)-98.9 F (37.2 C)] 97.8 F (36.6 C) (05/30 0800) Pulse Rate:  [54-97] 68 (05/30 0800) Resp:  [12-26] 21 (05/30 0800) BP: (130-179)/(77-120) 153/91 (05/30 0800) SpO2:  [94 %-99 %] 95 % (05/30 0800) Estimated body mass index is 27.99 kg/m as calculated from the following:   Height as of this encounter:  (1.651 m).   Weight as of this encounter: 76.3 kg (168 lb 3.2 oz).   Intake/Output from previous day: 05/29 0701 - 05/30 0700 In: 1798.1 [P.O.:640; I.V.:1158.1] Out: 2085 [Urine:2085] Intake/Output this shift: No intake/output data recorded.  Physical exam Glasgow Coma Scale 15. The patient is densely left hemiparetic, without change. His pupils are equal. His speech is normal.  I have reviewed the patient's head CT performed yesterday. It demonstrates the expected evolution of his right MCA infarction. He has slightly more mass effect than his previous scan.  Lab Results: No results for input(s): WBC, HGB, HCT, PLT in the last 72 hours. BMET Recent Labs    08/01/17 0004 08/01/17 0613  NA 145 144    Studies/Results: Ct Head Wo Contrast  Result Date: 08/01/2017 CLINICAL DATA:  52 y/o  M; follow-up of stroke. EXAM: CT HEAD WITHOUT CONTRAST TECHNIQUE: Contiguous axial images were obtained from the base of the skull through the vertex without intravenous contrast. COMPARISON:  07/30/2017 CT head.  07/27/2017 CT angiogram head. FINDINGS: Brain: Stable distribution of large right MCA infarction. Increased edema and mass effect with 7 mm right-to-left midline shift, previously 4 mm. Effacement of the right lateral ventricle. Stable size of left lateral ventricle. No herniation. No new focus of acute stroke, hemorrhage, or focal mass effect of the brain. Vascular: Increased density within the right middle  cerebral artery, probably thrombus. Skull: Normal. Negative for fracture or focal lesion. Sinuses/Orbits: Partial opacification of left mastoid air cells. Right anterior ethmoid air cell mucous retention cyst and mild mucosal thickening of the left maxillary sinus. Orbits are unremarkable. Other: None. IMPRESSION: 1. Large right MCA infarction is stable in distribution but increased in edema/mass effect. 7 mm of right-to-left midline shift, previously 4 mm. 2. No new acute stroke, hemorrhage, or focal mass effect. Electronically Signed   By: Mitzi Hansen M.D.   On: 08/01/2017 05:29    Assessment/Plan: Right MCA infarction: The patient is doing well clinically. It doesn't look like he will need surgery.  LOS: 5 days     Cristi Loron 08/01/2017, 10:19 AM

## 2017-08-02 ENCOUNTER — Inpatient Hospital Stay (HOSPITAL_COMMUNITY): Payer: Managed Care, Other (non HMO)

## 2017-08-02 LAB — SODIUM
SODIUM: 143 mmol/L (ref 135–145)
Sodium: 142 mmol/L (ref 135–145)

## 2017-08-02 NOTE — Progress Notes (Signed)
Physical Therapy Treatment Patient Details Name: MORDECHAI MATUSZAK MRN: [id number] DOB: Jan 12, 1966 Today's Date: 08/02/2017    History of Present Illness 52 yo male with PMH significant for HTN, tobacoo abuse admitted with R MCA infarct    PT Comments    Slow improvements in attention, but with easy loss of focus on task.  Emphasis on transfer safety and gait trainging.   Follow Up Recommendations  CIR     Equipment Recommendations  Rolling walker with 5" wheels    Recommendations for Other Services Rehab consult     Precautions / Restrictions Precautions Precautions: Fall Precaution Comments: l sided inattention still present but appears to be improving    Mobility  Bed Mobility Overal bed mobility: Needs Assistance Bed Mobility: Supine to Sit     Supine to sit: Mod assist     General bed mobility comments: more assist today with pt impulsively trying to get OOB before positioned to do so well.  Transfers Overall transfer level: Needs assistance Equipment used: Rolling walker (2 wheeled) Transfers: Sit to/from Stand Sit to Stand: Mod assist;Min assist;+2 physical assistance         General transfer comment: cues for hand placement and assist to come forward and up symmetrically  Ambulation/Gait Ambulation/Gait assistance: Mod assist;+2 physical assistance Ambulation Distance (Feet): 25 Feet(then additional 15 feet) Assistive device: Rolling walker (2 wheeled) Gait Pattern/deviations: Step-to pattern Gait velocity: slow   General Gait Details: Paretic gait on the left.  Pt needing w/shift assist, stability and occasional assist to advance or control L foot advance.   Stairs             Wheelchair Mobility    Modified Rankin (Stroke Patients Only) Modified Rankin (Stroke Patients Only) Modified Rankin: Moderately severe disability     Balance Overall balance assessment: Needs assistance   Sitting balance-Leahy Scale: Fair Sitting balance -  Comments: still extending more outside of his BOS and occasionally losing balance.   Standing balance support: Bilateral upper extremity supported Standing balance-Leahy Scale: Poor Standing balance comment: dependent on physical assist                            Cognition Arousal/Alertness: Awake/alert Behavior During Therapy: Impulsive;WFL for tasks assessed/performed Overall Cognitive Status: Impaired/Different from baseline Area of Impairment: (not test formally)                   Current Attention Level: Sustained   Following Commands: Follows one step commands with increased time     Problem Solving: Slow processing        Exercises      General Comments        Pertinent Vitals/Pain Pain Assessment: Faces Faces Pain Scale: Hurts a little bit Pain Location: L shoulder Pain Descriptors / Indicators: Aching Pain Intervention(s): Monitored during session    Home Living                      Prior Function            PT Goals (current goals can now be found in the care plan section) Acute Rehab PT Goals Patient Stated Goal: to get oob PT Goal Formulation: With patient Time For Goal Achievement: 08/12/17 Potential to Achieve Goals: Good Progress towards PT goals: Progressing toward goals    Frequency    Min 4X/week      PT Plan Current plan remains appropriate  Co-evaluation              AM-PAC PT "6 Clicks" Daily Activity  Outcome Measure  Difficulty turning over in bed (including adjusting bedclothes, sheets and blankets)?: Unable Difficulty moving from lying on back to sitting on the side of the bed? : Unable Difficulty sitting down on and standing up from a chair with arms (e.g., wheelchair, bedside commode, etc,.)?: Unable Help needed moving to and from a bed to chair (including a wheelchair)?: A Lot Help needed walking in hospital room?: A Lot Help needed climbing 3-5 steps with a railing? : A Lot 6 Click  Score: 9    End of Session   Activity Tolerance: Patient tolerated treatment well Patient left: in chair;with call bell/phone within reach;with chair alarm set Nurse Communication: Mobility status PT Visit Diagnosis: Unsteadiness on feet (R26.81);Hemiplegia and hemiparesis Hemiplegia - Right/Left: Left Hemiplegia - dominant/non-dominant: Non-dominant Hemiplegia - caused by: Cerebral infarction     Time: 9147-8295 PT Time Calculation (min) (ACUTE ONLY): 23 min  Charges:  $Gait Training: 8-22 mins $Therapeutic Activity: 8-22 mins                    G Codes:       Aug 25, 2017  [place] Bing, PT [phone]  (pager)   Eliseo Gum Stefen Juba 08-25-17, 5:22 PM

## 2017-08-02 NOTE — Plan of Care (Signed)
Pt able to feed himself, eats about 50% of his meals

## 2017-08-02 NOTE — Progress Notes (Signed)
Subjective: The patient is alert and pleasant and in no apparent distress.  He denies headaches.  Objective: Vital signs in last 24 hours: Temp:  [97.8 F (36.6 C)-98.7 F (37.1 C)] 98 F (36.7 C) (05/31 0400) Pulse Rate:  [57-81] 63 (05/31 0700) Resp:  [14-28] 17 (05/31 0700) BP: (118-179)/(78-108) 118/83 (05/31 0700) SpO2:  [93 %-100 %] 93 % (05/31 0700) Estimated body mass index is 27.99 kg/m as calculated from the following:   Height as of this encounter: 5\' 5"  (1.651 m).   Weight as of this encounter: 76.3 kg (168 lb 3.2 oz).   Intake/Output from previous day: 05/30 0701 - 05/31 0700 In: 494.4 [P.O.:220; I.V.:274.4] Out: 1175 [Urine:1175] Intake/Output this shift: No intake/output data recorded.  Physical exam the patient is alert and oriented x3.  His speech is normal.  He has fairly good strength in his left lower extremity.  He is plegic in his left upper extremity.  His pupils are equal.  Lab Results: No results for input(s): WBC, HGB, HCT, PLT in the last 72 hours. BMET Recent Labs    08/02/17 0049 08/02/17 0611  NA 143 142    Studies/Results: Ct Head Wo Contrast  Result Date: 08/01/2017 CLINICAL DATA:  52 y/o  M; follow-up of stroke. EXAM: CT HEAD WITHOUT CONTRAST TECHNIQUE: Contiguous axial images were obtained from the base of the skull through the vertex without intravenous contrast. COMPARISON:  07/30/2017 CT head.  07/27/2017 CT angiogram head. FINDINGS: Brain: Stable distribution of large right MCA infarction. Increased edema and mass effect with 7 mm right-to-left midline shift, previously 4 mm. Effacement of the right lateral ventricle. Stable size of left lateral ventricle. No herniation. No new focus of acute stroke, hemorrhage, or focal mass effect of the brain. Vascular: Increased density within the right middle cerebral artery, probably thrombus. Skull: Normal. Negative for fracture or focal lesion. Sinuses/Orbits: Partial opacification of left mastoid  air cells. Right anterior ethmoid air cell mucous retention cyst and mild mucosal thickening of the left maxillary sinus. Orbits are unremarkable. Other: None. IMPRESSION: 1. Large right MCA infarction is stable in distribution but increased in edema/mass effect. 7 mm of right-to-left midline shift, previously 4 mm. 2. No new acute stroke, hemorrhage, or focal mass effect. Electronically Signed   By: Mitzi HansenLance  Furusawa-Stratton M.D.   On: 08/01/2017 05:29   Mr Brain Wo Contrast  Result Date: 08/02/2017 CLINICAL DATA:  52 y/o  M; stroke for follow-up. EXAM: MRI HEAD WITHOUT CONTRAST TECHNIQUE: Multiplanar, multiecho pulse sequences of the brain and surrounding structures were obtained without intravenous contrast. COMPARISON:  08/01/2017 CT head. FINDINGS: Brain: Motion degradation of several sequences. Large right MCA distribution infarction is stable in distribution in comparison with the prior CT of head given difference in technique involving the right lateral frontal lobe, lateral and superior temporal lobe, caudate nucleus, lentiform nucleus, and insula. The region of infarction demonstrates mildly reduced diffusion, T2 hyperintensity, and local mass effect. There is patchy susceptibility hypointensity within the right basal ganglia compatible with petechial hemorrhage. Mass effect from the infarct partially effaces the right lateral ventricle and results in 6 mm of right-to-left midline shift, similar to prior study. No herniation. No new focus of reduced diffusion to indicate interval acute infarction. Background of mild chronic microvascular ischemic changes and parenchymal volume loss of the brain. Vascular: Normal central flow voids. Skull and upper cervical spine: Normal marrow signal. Sinuses/Orbits: Negative. Other: None. IMPRESSION: 1. Stable distribution of large right MCA early subacute infarction given  differences in technique. Petechial hemorrhage within the right basal ganglia. Stable to  minimally decreased mass effect with 6 mm right-to-left midline shift. 2. No new acute intracranial abnormality identified. Electronically Signed   By: Mitzi Hansen M.D.   On: 08/02/2017 05:47    Assessment/Plan: Right MCA infarction: The patient continues to be stable clinically.  He does not need surgery.  LOS: 6 days     Cristi Loron 08/02/2017, 7:28 AM

## 2017-08-02 NOTE — Progress Notes (Signed)
STROKE TEAM PROGRESS NOTE      SUBJECTIVE (INTERVAL HISTORY) No family members present. The patient remains alert and follows commands. He remains plegic on the left.   Hypertonic saline has been discontinued   OBJECTIVE Temp:  [97.5 F (36.4 C)-98.2 F (36.8 C)] 98 F (36.7 C) (05/31 1200) Pulse Rate:  [57-81] 62 (05/31 1500) Cardiac Rhythm: Normal sinus rhythm (05/31 0800) Resp:  [14-28] 16 (05/31 1500) BP: (118-170)/(78-108) 139/89 (05/31 1500) SpO2:  [93 %-98 %] 96 % (05/31 1500)  CBC:  Recent Labs  Lab 07/27/17 2025  07/28/17 0254 07/29/17 0611  WBC 16.1*  --  14.1* 11.7*  NEUTROABS 11.9*  --   --   --   HGB 18.0*   < > 18.3* 16.1  HCT 52.2*   < > 52.1* 47.7  MCV 97.9  --  99.0 101.3*  PLT 254  --  234 205   < > = values in this interval not displayed.    Basic Metabolic Panel:  Recent Labs  Lab 07/27/17 2025 07/27/17 2036 07/28/17 0254  07/29/17 0611  08/02/17 0049 08/02/17 0611  NA 137 141  --    < > 146*   < > 143 142  K 3.7 3.7  --   --  3.9  --   --   --   CL 102 103  --   --  116*  --   --   --   CO2 25  --   --   --  23  --   --   --   GLUCOSE 120* 118*  --   --  108*  --   --   --   BUN 11 12  --   --  11  --   --   --   CREATININE 1.22 1.00 1.12  --  0.97  --   --   --   CALCIUM 9.6  --   --   --  8.1*  --   --   --   MG  --   --   --   --  2.2  --   --   --    < > = values in this interval not displayed.    Lipid Panel:     Component Value Date/Time   CHOL 172 07/28/2017 0848   TRIG 99 07/28/2017 0848   HDL 46 07/28/2017 0848   CHOLHDL 3.7 07/28/2017 0848   VLDL 20 07/28/2017 0848   LDLCALC 106 (H) 07/28/2017 0848   HgbA1c:  Lab Results  Component Value Date   HGBA1C 5.0 07/28/2017   Urine Drug Screen:     Component Value Date/Time   LABOPIA NONE DETECTED 07/28/2017 1700   COCAINSCRNUR POSITIVE (A) 07/28/2017 1700   LABBENZ NONE DETECTED 07/28/2017 1700   AMPHETMU NONE DETECTED 07/28/2017 1700   THCU NONE DETECTED 07/28/2017  1700   LABBARB NONE DETECTED 07/28/2017 1700    Alcohol Level No results found for: ETH  IMAGING  Ct Angio Head W Or Wo Contrast Ct Angio Neck W Or Wo Contrast Ct Cerebral Perfusion W Contrast 07/27/2017 IMPRESSION:  1. Large right MCA territory infarct.  CBF< 30% is 69 mL.  2. Hypoperfusion index of 0.6 consistent with very poor collaterals and rapidly progressive infarct.  3. 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  4. Marked decreased caliber of the cervical right internal carotid artery distal to  the stenosis.  5. Reconstitution of the right internal carotid artery at the ophthalmic segment with some filling of the fetal type right posterior cerebral artery.  6. Occluded right M1 segment with poor collaterals.  7. Atherosclerotic changes at the left carotid bifurcation without significant stenosis.  8. Left MCA and ACA vessels are within normal limits.  9. Small basilar artery feeds the left posterior cerebral artery.    Dg Chest 2 View 07/28/2017 IMPRESSION:  No active cardiopulmonary disease.    Dg Foot 2 Views Left 07/27/2017 IMPRESSION:  Negative.    Ct Head Code Stroke Wo Contrast 07/27/2017 IMPRESSION:  1. Large right MCA territory infarct involving the insular cortex, lentiform nucleus, anterior limb internal capsule, caudate head, and right temporal lobe. This results in some mass effect with effacement of the sulci and right lateral ventricle but no hemorrhage.  2. Hyperdense right MCA extending beyond the bifurcation compatible with thrombus.  3. Mild diffuse subcortical white matter hypoattenuation bilaterally likely reflects microvascular ischemia.  4. ASPECTS is 4/10    CT Head Wo Contrast - stable appearance of the 4 mm right left midline shift and cytotoxic edema. 07/30/2017   MRI Brain Wo Contrast -1. Stable distribution of large right MCA early subacute infarction given differences  in technique. Petechial hemorrhage within the right basal ganglia. Stable to minimally decreased mass effect with 6 mm right-to-left midline shift. 2. No new acute intracranial abnormality identified   Transthoracic Echocardiogram -Left ventricle: The cavity size was normal. There was mild focal   basal hypertrophy of the septum. Systolic function was normal.The estimated ejection fraction was in the range of 55% to 60%.Wall motion was normal; there were no regional wall motion abnormalities   PHYSICAL EXAM Vitals:   08/02/17 1200 08/02/17 1300 08/02/17 1400 08/02/17 1500  BP: 138/90 (!) 144/88 (!) 136/94 139/89  Pulse: 63 67 70 62  Resp: Temp: 98 F (36.7 C)     TempSrc: Oral     SpO2: 95% 94% 96% 96%  Weight:      Height:        General -  Heart - Regular rate and rhythm - no murmer appreciated Lungs - Clear to auscultation anteriorly Abdomen - Soft - non tender Extremities - Distal pulses intact - no edema Skin - Warm and dry  Mental Status: Drowsy but can be aroused easily   Speech fluent without evidence of aphasia.  Able to follow simple commands Cranial Nerves: II: Discs not visualized; hemianopia III,IV, UJ:WJXBJ gaze preference but able to look to left past midline, PERRLdecrease blink to threat on left V,VII: dense left facial droop with decreased sensation left VIII: hearing normal to voice IX,X: cough and gag intact, protecting his own airway XI: bilateral shoulder shrug intact. XII: tongue deviated Motor: Dense left arm weakness, withdraws left LE Decreased tone on the left Sensory: decreased sensation on the left Plantars: Right: downgoing   Left: upgoing Cerebellar: normal finger-to-nose right, unable on the left due to weakness Gait: not tested     ASSESSMENT/PLAN [name] is a 52 y.o. male with history of tobacco use, hypertension,and medical noncompliance presenting with left hemiplegia. He did not receive IV t-PA due to  unknown time of onset.  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  Rt MCA compatible with thrombus.      Carotid Doppler - CTA neck  2D Echo - normal EF no clot  LDL - 106  HgbA1c - 5.0  VTE prophylaxis - Lovenox / SCDs Diet Order           Diet Heart Room service appropriate? Yes; Fluid consistency: Thin  Diet effective now          No antithrombotic prior to admission, now on aspirin 325 mg daily  Patient counseled to be compliant with his antithrombotic medications  Ongoing aggressive stroke risk factor management  Therapy recommendations:  CLR  Disposition:  CLR  Hypertension  Stable - systolic blood pressure goal at this time is 120 to 180 (severe carotid stenosis with large stroke) . Long-term BP goal normotensive  Hyperlipidemia  Lipid lowering medication PTA:  none  LDL 106, goal < 70  Current lipid lowering medication: now on Lipitor 40 mg daily  Continue statin at discharge    Hypertonic Saline 3% (started 07/28/2017)  Na - 137->141->146  Repeat Head CT - Tuesday AM 5/28  Central venous catheter 07/29/2017 Dr Wallace Cullens    Other Stroke Risk Factors  Cigarette smoker - advised to stop smoking  ETOH use, advised to drink no more than 1 alcoholic beverage per day.  Overweight, Body mass index is 27.99 kg/m., recommend weight loss, diet and exercise as appropriate   Family hx stroke (grandmother had stroke in her 14s)  Substance abuse   Other Active  Problems  Pancytosis  Combativeness - resolved - possibly drug related  UDS - positive for cocaine  Severe R ICA stenosis - Dr Imogene Burn consulted - no surgery recommended at that time however patient has shown some improvement.  NS - Dr Lovell Sheehan following - surgery not indicated at this time  CCM on board   Plan / Recommendations   Stroke workup:  Completed  Therapy Follow Up: CLR  Disposition: CLR  Antiplatelet / Anticoagulation: ASA 325 mg daily  (consider DAPT in future - hold for now 2/2 large stroke))  Statin: Lipitor 40 mg daily (may need to increase - LDL pending)  MD Follow Up: [facility] in 6-8 weeks  Other: consults include - CCM ; NSY; Vascular surgery for carotid stenosis   Further risk factor modification per primary care MD: Follow Up 2 weeks  Patient has remained neurologically fairly stable and may be outside the timeframe for maximum cerebral edema and have  tapered and stopped  hypertonic saline.MRI scan of the brain today shows stable cytotoxic edema and mild midline shift without change. No family at the bedside.institute alcohol withdrawal protocol given his history of heavy alcohol usage. r.  Discussed with critical care MD.mobilize out of bed with physical occupational therapy.and transfer to floor bed and to inpatient rehabilitation when bed available..This patient is critically ill and at significant risk of neurological worsening, death and care requires constant monitoring of vital signs, hemodynamics,respiratory and cardiac monitoring, extensive review of multiple databases, frequent neurological assessment, discussion with family, other specialists and medical decision making of high complexity.I have made any additions or clarifications directly to the above note.This critical care time does not reflect procedure time, or teaching time  or supervisory time of PA/NP/Med Resident etc but could involve care discussion time.  I spent 30  minutes of neurocritical care time  in the care of  this patient.      Delia Heady, MD Medical Director [facility] Pager: [phone] 08/02/2017 4:11 PM   To contact Stroke Continuity provider, please refer to [email or web address]. After hours, contact General Neurology

## 2017-08-02 NOTE — Care Management Note (Signed)
Case Management Note  Patient Details  Name: [name] [name] MRN: [id number] Date of Birth: 01/27/1966  Subjective/Objective:  [age] yo male with PMH significant for HTN, tobacoo abuse admitted with R MCA infarct. PTA, pt independent, lives alone; has two sisters.                     Action/Plan: PT/OT recommending CIR, and insurance authorization has been initiated.  Hopeful for dc to CIR early next week.   Expected Discharge Date:                  Expected Discharge Plan:  IP Rehab Facility  In-House Referral:     Discharge planning Services  CM Consult  Post Acute Care Choice:    Choice offered to:     DME Arranged:    DME Agency:     HH Arranged:    HH Agency:     Status of Service:  In process, will continue to follow  If discussed at Long Length of Stay Meetings, dates discussed:    Additional Comments:  Glennon Macmerson, Sabrin Dunlevy M, RN 08/02/2017, 5:16 PM

## 2017-08-02 NOTE — Progress Notes (Signed)
Inpatient Rehabilitation  Met with patient and reviewed the insurance verification letter and answered questions about IP Rehab program.  Patient is eager to get there and get back his independence.  Also called sister, Helene Kelp and notified her that IP Rehab booklets as well as insurance verification letter have been placed in his patient's belongings bag for them to review this weekend.  Continuing to follow for timing of medical readiness, insurance approval, final family discharge plans, and IP Rehab bed availability.  Working toward hopeful admit Monday, 08/05/17.  Call if questions.   Carmelia Roller., CCC/SLP Admission Coordinator  [facility]  Cell [phone]

## 2017-08-03 MED ORDER — ACETAMINOPHEN 325 MG PO TABS
650.0000 mg | ORAL_TABLET | Freq: Four times a day (QID) | ORAL | Status: DC | PRN
  Administered 2017-08-04: 650 mg via ORAL
  Filled 2017-08-03 (×2): qty 2

## 2017-08-03 MED ORDER — SENNA 8.6 MG PO TABS
1.0000 | ORAL_TABLET | Freq: Every day | ORAL | Status: DC
  Administered 2017-08-03 – 2017-08-04 (×2): 8.6 mg via ORAL
  Filled 2017-08-03 (×2): qty 1

## 2017-08-03 NOTE — Progress Notes (Signed)
STROKE TEAM PROGRESS NOTE    SUBJECTIVE (INTERVAL HISTORY) No family members present. The patient remains alert and follows commands. His left sided weakness improved some, able to have 3/5 LLE. Still plegic on the LUE. Speech fluent.   OBJECTIVE Temp:  [97.9 F (36.6 C)-98.3 F (36.8 C)] 98.3 F (36.8 C) (06/01 1210) Pulse Rate:  [56-99] 99 (06/01 1210) Cardiac Rhythm: Normal sinus rhythm (06/01 0816) Resp:  [17-20] 18 (06/01 1210) BP: (109-157)/(62-104) 109/88 (06/01 1210) SpO2:  [93 %-100 %] 99 % (06/01 1210)  CBC:  Recent Labs  Lab 07/27/17 2025  07/28/17 0254 07/29/17 0611  WBC 16.1*  --  14.1* 11.7*  NEUTROABS 11.9*  --   --   --   HGB 18.0*   < > 18.3* 16.1  HCT 52.2*   < > 52.1* 47.7  MCV 97.9  --  99.0 101.3*  PLT 254  --  234 205   < > = values in this interval not displayed.    Basic Metabolic Panel:  Recent Labs  Lab 07/27/17 2025 07/27/17 2036 07/28/17 0254  07/29/17 0611  08/02/17 0049 08/02/17 0611  NA 137 141  --    < > 146*   < > 143 142  K 3.7 3.7  --   --  3.9  --   --   --   CL 102 103  --   --  116*  --   --   --   CO2 25  --   --   --  23  --   --   --   GLUCOSE 120* 118*  --   --  108*  --   --   --   BUN 11 12  --   --  11  --   --   --   CREATININE 1.22 1.00 1.12  --  0.97  --   --   --   CALCIUM 9.6  --   --   --  8.1*  --   --   --   MG  --   --   --   --  2.2  --   --   --    < > = values in this interval not displayed.    Lipid Panel:     Component Value Date/Time   CHOL 172 07/28/2017 0848   TRIG 99 07/28/2017 0848   HDL 46 07/28/2017 0848   CHOLHDL 3.7 07/28/2017 0848   VLDL 20 07/28/2017 0848   LDLCALC 106 (H) 07/28/2017 0848   HgbA1c:  Lab Results  Component Value Date   HGBA1C 5.0 07/28/2017   Urine Drug Screen:     Component Value Date/Time   LABOPIA NONE DETECTED 07/28/2017 1700   COCAINSCRNUR POSITIVE (A) 07/28/2017 1700   LABBENZ NONE DETECTED 07/28/2017 1700   AMPHETMU NONE DETECTED 07/28/2017 1700   THCU NONE DETECTED 07/28/2017 1700   LABBARB NONE DETECTED 07/28/2017 1700    Alcohol Level No results found for: ETH  IMAGING I have personally reviewed the radiological images below and agree with the radiology interpretations.  Ct Angio Head W Or Wo Contrast Ct Angio Neck W Or Wo Contrast Ct Cerebral Perfusion W Contrast 07/27/2017 IMPRESSION:  1. Large right MCA territory infarct.  CBF< 30% is 69 mL.  2. Hypoperfusion index of 0.6 consistent with very poor collaterals and rapidly progressive infarct.  3. High-grade, critical, near occlusive, stenosis of the right internal carotid artery 10 mm from  the bifurcation.  4. Marked decreased caliber of the cervical right internal carotid artery distal to the stenosis.  5. Reconstitution of the right internal carotid artery at the ophthalmic segment with some filling of the fetal type right posterior cerebral artery.  6. Occluded right M1 segment with poor collaterals.  7. Atherosclerotic changes at the left carotid bifurcation without significant stenosis.  8. Left MCA and ACA vessels are within normal limits.  9. Small basilar artery feeds the left posterior cerebral artery.   Ct Head Code Stroke Wo Contrast 07/27/2017 IMPRESSION:  1. Large right MCA territory infarct involving the insular cortex, lentiform nucleus, anterior limb internal capsule, caudate head, and right temporal lobe. This results in some mass effect with effacement of the sulci and right lateral ventricle but no hemorrhage.  2. Hyperdense right MCA extending beyond the bifurcation compatible with thrombus.  3. Mild diffuse subcortical white matter hypoattenuation bilaterally likely reflects microvascular ischemia.  4. ASPECTS is 4/10   CT Head Wo Contrast  08/01/2017 IMPRESSION: 1. Large right MCA infarction is stable in distribution but increased in edema/mass effect. 7 mm of right-to-left midline shift, previously 4 mm. 2. No new acute stroke, hemorrhage, or focal  mass effect.  MRI Brain Wo Contrast  08/02/2017 IMPRESSION: 1. Stable distribution of large right MCA early subacute infarction given differences in technique. Petechial hemorrhage within the right basal ganglia. Stable to minimally decreased mass effect with 6 mm right-to-left midline shift. 2. No new acute intracranial abnormality identified.  Transthoracic Echocardiogram -Left ventricle: The cavity size was normal. There was mild focal   basal hypertrophy of the septum. Systolic function was normal.The estimated ejection fraction was in the range of 55% to 60%. Wall motion was normal; there were no regional wall motion abnormality    PHYSICAL EXAM  Temp:  [98 F (36.7 C)-98.3 F (36.8 C)] 98 F (36.7 C) (06/01 2353) Pulse Rate:  [58-99] 77 (06/01 2353) Resp:  [17-18] 18 (06/01 2353) BP: (106-121)/(62-92) 110/63 (06/01 2353) SpO2:  [94 %-99 %] 95 % (06/01 2353)  General - Well nourished, well developed, in no apparent distress.  Ophthalmologic - fundi not visualized due to noncooperation.  Cardiovascular - Regular rate and rhythm.  Mental Status -  Level of arousal and orientation to time, place, and person were intact. Language including expression, naming, repetition, comprehension was assessed and found intact. Fund of Knowledge was assessed and was intact.  Cranial Nerves II - XII - II - Visual field intact OU. III, IV, VI - Extraocular movements intact. V - Facial sensation intact bilaterally. VII - left mild facial droop. VIII - Hearing & vestibular intact bilaterally. X - Palate elevates symmetrically. XI - Chin turning & shoulder shrug intact bilaterally XII - Tongue protrusion intact.  Motor Strength - The patient's strength was normal in RUE and RLE, however, left UE 2/5 bicep otherwise 1/5, LLE 3/5 proximal and distal.  Bulk was normal and fasciculations were absent.   Motor Tone - Muscle tone was assessed at the neck and appendages and was normal.  Reflexes  - The patient's reflexes were symmetrical in all extremities and he had no pathological reflexes.  Sensory - Light touch, temperature/pinprick were assessed and were symmetrical.    Coordination - The patient had normal movements in the right hands with no ataxia or dysmetria.  Tremor was absent.  Gait and Station - deferred.   ASSESSMENT/PLAN [name] [name] is a 52 y.o. male with history of tobacco use, hypertension,and medical  noncompliance presenting with left hemiplegia. He did not receive IV t-PA due to unknown time of onset.  Large right MCA stroke - likely A-A embolic from proximal high grade  carotid stenosis  Resultant  Left hemiparesis arm >> leg, facial droop  CT head - Large right MCA territory infarct involving the insular cortex, lentiform nucleus, anterior limb internal capsule, caudate head, and right temporal lobe  MRI head - Stable distribution of large right MCA. 6 mm right-to-left midline shift.  CTA H&N - High-grade, critical, stenosis Rt ICA.  Rt MCA compatible with thrombus.   2D Echo -normal EF  LDL - 106  HgbA1c - 5.0  VTE prophylaxis - Lovenox / SCDs  No antithrombotic prior to admission, now on aspirin 325 mg daily  Patient counseled to be compliant with his antithrombotic medications  Ongoing aggressive stroke risk factor management  Therapy recommendations:  CIR  Disposition:  Pending  Right carotid stenosis  CTA H&N - High-grade, critical, stenosis Rt ICA.  Likely the cause of current stroke  VVS consulted initially but recommend no surgery due to poor neuro status  Currently pt neuro stable and improved, will call VVS again.   Cerebral edema  Was on 3% saline now off  Na now 142  MRI 08/02/17 showed stable right MCA large infarct with 6mm midline shift  Will d/c central line  Hypertension  Stable   BP goal at this time is 120 to 160 due to severe carotid stenosis with large stroke  Hyperlipidemia  Lipid lowering  medication PTA:  none  LDL 106, goal < 70  Current lipid lowering medication: now on Lipitor 40 mg daily  Continue statin at discharge  Cocaine abuse  UDS positive for cocaine  Cessation education provided  Pt is willing to quit  Tobacco abuse  Current smoker  Smoking cessation counseling provided  Pt is willing to quit  Other Stroke Risk Factors  ETOH use, advised to drink no more than 1 alcoholic beverage per day.  Family hx stroke (grandmother had stroke in her 76s)  Other Active Problems  Leukocytosis - improving  Hospital day #8  Marvel Plan, MD PhD Stroke Neurology 08/04/2017 2:34 AM    To contact Stroke Continuity provider, please refer to [email or web address]. After hours, contact General Neurology

## 2017-08-03 NOTE — Progress Notes (Signed)
L scv cvc removed.  Sutures removed. vaseline guaze pressure dsg applied. Pressure x 3 minutes.  No bleeding at this time.

## 2017-08-03 NOTE — Progress Notes (Signed)
Patient transferred from 4N ICU is alert and oriented has left side weakness arm greater than leg, refuses SCD's but is on Lovenox, has order for cardiac monitoring, will continue to monitor.

## 2017-08-04 DIAGNOSIS — F1410 Cocaine abuse, uncomplicated: Secondary | ICD-10-CM | POA: Diagnosis present

## 2017-08-04 DIAGNOSIS — I6529 Occlusion and stenosis of unspecified carotid artery: Secondary | ICD-10-CM | POA: Diagnosis present

## 2017-08-04 LAB — CBC
HCT: 49.5 % (ref 39.0–52.0)
Hemoglobin: 17.3 g/dL — ABNORMAL HIGH (ref 13.0–17.0)
MCH: 34.2 pg — ABNORMAL HIGH (ref 26.0–34.0)
MCHC: 34.9 g/dL (ref 30.0–36.0)
MCV: 97.8 fL (ref 78.0–100.0)
PLATELETS: 261 10*3/uL (ref 150–400)
RBC: 5.06 MIL/uL (ref 4.22–5.81)
RDW: 11.7 % (ref 11.5–15.5)
WBC: 10.7 10*3/uL — AB (ref 4.0–10.5)

## 2017-08-04 LAB — BASIC METABOLIC PANEL
ANION GAP: 8 (ref 5–15)
BUN: 19 mg/dL (ref 6–20)
CALCIUM: 9.2 mg/dL (ref 8.9–10.3)
CO2: 27 mmol/L (ref 22–32)
Chloride: 106 mmol/L (ref 101–111)
Creatinine, Ser: 0.99 mg/dL (ref 0.61–1.24)
GLUCOSE: 107 mg/dL — AB (ref 65–99)
Potassium: 3.6 mmol/L (ref 3.5–5.1)
Sodium: 141 mmol/L (ref 135–145)

## 2017-08-04 MED ORDER — PANTOPRAZOLE SODIUM 40 MG PO TBEC
40.0000 mg | DELAYED_RELEASE_TABLET | Freq: Every day | ORAL | Status: DC
  Administered 2017-08-04 – 2017-08-05 (×2): 40 mg via ORAL
  Filled 2017-08-04: qty 1

## 2017-08-04 MED ORDER — FLEET ENEMA 7-19 GM/118ML RE ENEM: 1.0000 | ENEMA | Freq: Once | RECTAL | Status: DC

## 2017-08-04 NOTE — PMR Pre-admission (Signed)
PMR Admission Coordinator Pre-Admission Assessment  Patient: [name] [name] is an 52 y.o., male MRN: [id number] DOB: May 18, 1965 Height: 5\' 5"  (165.1 cm) Weight: 76.3 kg (168 lb 3.2 oz)              Insurance Information HMO:     PPO:      PCP:      IPA:      80/20:      OTHER: [facility] Open Access AETNA Plan   PRIMARY: AETNA      Policy#: [id number]      Subscriber: Self CM Name: Raquel       Phone#: [phone]     Fax#: [phone] Pre-Cert#: [id number] for 7 days 08/05/17-08/11/17     Employer: Full-Time Benefits:  Phone #: [phone]     Name: Verified online at [email or web address] Eff. 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  Patient refused to come to the ED initially.  UDS was positive for cocaine.  CTA head neck/perfusion showed large MCA infarct with high-grade near occlusive stenosis of right-ICA at the bifurcation and occluded right M1 segment with poor collaterals.  2D echo done showed showing EF of 55 to 60% with grade 2 diastolic dysfunction and no PFO.  He had declining mental status after admission requiring transfer to ICU.  Dr. Evette Cristal was consulted for input and felt that immediate surgical intervention likely not helpful with concerns of hemorrhagic  conversion.  Follow-up CT head showed mild mass-effect and Dr. Lovell Sheehan recommended treatment with hypertonic saline.  Repeat CT 5/30 showed stable infarct however increase in mass-effect and 7 mm right to left midline shift with effacement.  Mentation is improving and he has been monitored conservatively.    He continues to be limited by left inattention and left sided weakness affecting mobility as well as ability to carry out ADL tasks. Therapy has been ongoing and CIR was recommended due to functional deficits.  NIH Total: 8  Past Medical History  Past Medical History:  Diagnosis Date  . GERD (gastroesophageal reflux disease)   . Hypertension   . Nephrolithiasis   . Tobacco abuse     Family History  family history includes Alcoholism in his maternal uncle; CVA in his paternal grandmother; Cancer in his paternal grandmother; Diabetes in his maternal grandmother; Lung cancer in his father; Nephrolithiasis in his father; Ovarian cancer in his mother; Prostate cancer (age of onset: 29) in his father.  Prior Rehab/Hospitalizations:  Has the patient had major surgery during 100 days prior to admission? No  Current Medications   Current Facility-Administered Medications:  .  acetaminophen (TYLENOL) tablet  650 mg, 650 mg, Oral, Q6H PRN, Milon Dikes, MD, 650 mg at 08/04/17 1239 .  enoxaparin (LOVENOX) injection 40 mg, 40 mg, Subcutaneous, Daily, Micki Riley, MD, 40 mg at 08/05/17 0927 .  folic acid (FOLVITE) tablet 1 mg, 1 mg, Oral, Daily, Micki Riley, MD, 1 mg at 08/05/17 0927 .  multivitamin with minerals tablet 1 tablet, 1 tablet, Oral, Daily, Micki Riley, MD, 1 tablet at 08/05/17 [phone] .  pantoprazole (PROTONIX) EC tablet 40 mg, 40 mg, Oral, Daily, Marvel Plan, MD, 40 mg at 08/05/17 0927 .  senna (SENOKOT) tablet 8.6 mg, 1 tablet, Oral, QHS, Marvel Plan, MD, 8.6 mg at 08/04/17 2141 .  senna-docusate (Senokot-S) tablet 1 tablet, 1 tablet, Oral, QHS PRN, Micki Riley, MD, 1  tablet at 08/04/17 1239 .  sodium phosphate (FLEET) 7-19 GM/118ML enema 1 enema, 1 enema, Rectal, Once, Marvel Plan, MD  Patients Current Diet:  Diet Order           Diet Heart Room service appropriate? Yes; Fluid consistency: Thin  Diet effective now          Precautions / Restrictions Precautions Precautions: Fall Precaution Comments: l sided inattention still present but appears to be improving--pt will say he needs to look to his left and only needs min VCs to do so Restrictions Weight Bearing Restrictions: No   Has the patient had 2 or more falls or a fall with injury in the past year?No  Prior Activity Level Community (5-7x/wk): Prior to admission patient was working full-time as an Arboriculturist, active, and fully independent.  Home Assistive Devices / Equipment Home Assistive Devices/Equipment: None Home Equipment: None  Prior Device Use: Indicate devices/aids used by the patient prior to current illness, exacerbation or injury? None of the above  Prior Functional Level Prior Function Level of Independence: Independent  Self Care: Did the patient need help bathing, dressing, using the toilet or eating? Independent  Indoor Mobility: Did the patient need assistance with walking from room to room (with or without device)? Independent  Stairs: Did the patient need assistance with internal or external stairs (with or without device)? Independent  Functional Cognition: Did the patient need help planning regular tasks such as shopping or remembering to take medications? Independent  Current Functional Level Cognition  Arousal/Alertness: Awake/alert Overall Cognitive Status: Impaired/Different from baseline Current Attention Level: Selective Orientation Level: Oriented X4 Following Commands: Follows one step commands consistently Safety/Judgement: Decreased awareness of safety, Decreased awareness of deficits General Comments: Patient with improvements in  attention and task performance; more attentive to LUE today post intial cuing Attention: Selective, Sustained Sustained Attention: Impaired Sustained Attention Impairment: Verbal complex, Functional complex Selective Attention: Impaired Selective Attention Impairment: Verbal complex, Functional complex Memory: Impaired Memory Impairment: Retrieval deficit, Decreased recall of new information, Decreased short term memory Decreased Short Term Memory: Verbal basic, Functional basic Awareness: Impaired Awareness Impairment: Emergent impairment, Anticipatory impairment, Intellectual impairment Problem Solving: Impaired Problem Solving Impairment: Verbal basic, Functional basic Executive Function: Reasoning, Organizing, Decision Making, Initiating, Self Monitoring, Self Correcting Reasoning: Impaired Reasoning Impairment: Verbal basic, Functional basic Organizing: Impaired Organizing Impairment: Verbal basic, Functional basic Decision Making: Impaired Decision Making Impairment: Verbal basic, Functional basic Initiating: Impaired Initiating Impairment: Verbal basic, Functional basic Self Monitoring: Impaired Self Monitoring Impairment: Verbal basic, Functional basic Self Correcting: Impaired Self Correcting Impairment: Verbal basic, Functional basic Behaviors: Restless, Impulsive, Poor frustration tolerance Safety/Judgment: Impaired    Extremity Assessment (includes Sensation/Coordination)  Upper Extremity Assessment: RUE deficits/detail LUE  Deficits / Details: brunstrom I flaccid pt with sessation to tactile input. pt showed awareness to L UE and states "i have to move it before i move so i dont roll on it"  LUE Sensation: decreased light touch, decreased proprioception LUE Coordination: decreased fine motor, decreased gross motor  Lower Extremity Assessment: Defer to PT evaluation LLE Deficits / Details: Initially no voluntary movement.  On standing, pt able to w/bear through the left  LE and show some control of locking and unlocking the knee in w/bearing. LLE Coordination: decreased fine motor    ADLs  Overall ADL's : Needs assistance/impaired Eating/Feeding: Supervision/ safety, Sitting Eating/Feeding Details (indicate cue type and reason): only to make sure he finds everything on his tray Grooming: Wash/dry hands, Wash/dry face, Oral care, Brushing hair, Minimal assistance, Standing Grooming Details (indicate cue type and reason): at sink with Stedy Upper Body Bathing: Moderate assistance Lower Body Bathing: Maximal assistance Lower Body Dressing: Moderate assistance(sitting EOB to don socks) Lower Body Dressing Details (indicate cue type and reason): pt able to place R sock on Le with (A) to hook big toe. pt requires total (A) for L LE Toilet Transfer: Minimal assistance, +2 for physical assistance, Ambulation Toilet Transfer Details (indicate cue type and reason): pt able to progress bil LE with ataxic L LE movement. pt scissoring Functional mobility during ADLs: +2 for physical assistance, Minimal assistance General ADL Comments: pt with scissoring and narrowed gait with transfer. pt needed max cues for sequence and L LE placement/ safety. pt needs faciliation of R weight shift to advance LLE    Mobility  Overal bed mobility: Needs Assistance Bed Mobility: Supine to Sit Supine to sit: Min guard Sit to supine: Mod assist General bed mobility comments: Min guard for safety    Transfers  Overall transfer level: Needs assistance Equipment used: Rolling walker (2 wheeled) Transfer via Lift Equipment: Stedy Transfers: Sit to/from Merrill LynchStand Sit to Stand: Min assist, +2 physical assistance Stand pivot transfers: Min assist, Mod assist, +2 physical assistance  Lateral/Scoot Transfers: Min assist, +2 safety/equipment General transfer comment: Min assist with use of stedy to for upright activity    Ambulation / Gait / Stairs / Wheelchair Mobility   Ambulation/Gait Ambulation/Gait assistance: Mod assist, +2 physical assistance Ambulation Distance (Feet): 10 Feet Assistive device: 2 person hand held assist Gait Pattern/deviations: Step-to pattern General Gait Details: VCs for step initiation and quad setting during loading response. Moderate assist for stability with LUE supported Gait velocity: slow Gait velocity interpretation: <1.31 ft/sec, indicative of household ambulator    Posture / Balance Dynamic Sitting Balance Sitting balance - Comments: able to maintain with some dynamic movements at EOB Balance Overall balance assessment: Needs assistance Sitting-balance support: No upper extremity supported, Feet supported Sitting balance-Leahy Scale: Fair Sitting balance - Comments: able to maintain with some dynamic movements at EOB Standing balance support: Bilateral upper extremity supported, During functional activity Standing balance-Leahy Scale: Poor Standing balance comment: able to perform upright activity with LLE blocked out by stedy knee support. Assist to maintain position of LUE    Special needs/care consideration BiPAP/CPAP: No CPM: No Continuous Drip IV: No Dialysis: No         Life Vest: No Oxygen: No Special Bed: No Trach Size: No Wound Vac (area): No       Skin: WDL  Bowel mgmt:Continent, last BM 08/04/17 Bladder mgmt: Incontinent with external foley in place for management  Diabetic mgmt No, HgbA1c 5.0    Previous Home Environment Living Arrangements: Alone  Lives With: Alone Available Help at Discharge: Family, Available PRN/intermittently Type of Home: Mobile home Home Layout: One level Home Access: Stairs to enter Entrance Stairs-Number of Steps: ALOT ( HOUSE IS ON A HILL PER PATIENT) Bathroom Shower/Tub: Engineer, manufacturing systems: Standard Home Care Services: No Additional Comments: 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 sister providing details of home setup .  has two sisters and  joan brown is a neighbor that helps take care of his mobile home when he is away for work  Discharge Living Setting Plans for Discharge Living Setting: Patient's home Type of Home at Discharge: House Discharge Home Layout: One level Discharge Home Access: Stairs to enter Entrance Stairs-Rails: Can reach both Entrance Stairs-Number of Steps: 10 Discharge Bathroom Shower/Tub: Walk-in shower Discharge Bathroom Toilet: Standard Discharge Bathroom Accessibility: Yes How Accessible: Accessible via walker Does the patient have any problems obtaining your medications?: No  Social/Family/Support Systems Patient Roles: Other (Comment)(Brother, uncle, and nephew) Contact Information: Sister: Rosey Bath  Anticipated Caregiver: Family to provide 24/7 and Rosey Bath to coordinate  Anticipated Caregiver's Contact Information: See above  Ability/Limitations of Caregiver: None Caregiver Availability: 24/7 Discharge Plan Discussed with Primary Caregiver: Yes Is Caregiver In Agreement with Plan?: Yes Does Caregiver/Family have Issues with Lodging/Transportation while Pt is in Rehab?: No  Goals/Additional Needs Patient/Family Goal for Rehab: PT: Mod I-Supervision; OT: Mod I-Supervision-Min A; SLP: Mod I  Expected length of stay: 18-24 days  Cultural Considerations: None Dietary Needs: Heart Healthy diet restrictions  Equipment Needs: TBD Special Service Needs: None Pt/Family Agrees to Admission and willing to participate: Yes Program Orientation Provided & Reviewed with Pt/Caregiver Including Roles  & Responsibilities: Yes  Decrease burden of Care through IP rehab admission: No   Possible need for SNF placement upon discharge: No  Patient Condition: This patient's medical and functional status has changed since the consult dated: 08/01/17 in which the Rehabilitation Physician determined and documented that the patient's condition is  appropriate for intensive rehabilitative care in an inpatient rehabilitation facility. See "History of Present Illness" (above) for medical update. Functional changes are: Currently requiring mod assist +2 HHA to ambulate 10 feet. Patient's medical and functional status update has been discussed with the Rehabilitation physician and patient remains appropriate for inpatient rehabilitation. Will admit to inpatient rehab today.  Preadmission Screen Completed By:  Trish Mage, 08/05/2017 1:47 PM ______________________________________________________________________   Discussed status with Dr. Allena Katz on 08/05/17 at 1347 and received telephone approval for admission today.  Admission Coordinator:  Trish Mage, time 1347/Date 08/05/17

## 2017-08-04 NOTE — Progress Notes (Signed)
STROKE TEAM PROGRESS NOTE    SUBJECTIVE (INTERVAL HISTORY) No family members present. Pt is alert and follows commands. His left sided weakness improved, able to have 3/5 LLE. Still plegic on the LUE. Speech fluent. Stated stomach ache today as he did not have bowel movement for 4 days. Will give fleet enema.   OBJECTIVE Temp:  [97.9 F (36.6 C)-98.3 F (36.8 C)] 98.1 F (36.7 C) (06/02 0833) Pulse Rate:  [67-99] 81 (06/02 0833) Cardiac Rhythm: Normal sinus rhythm (06/02 0849) Resp:  [17-18] 17 (06/02 0833) BP: (106-121)/(63-89) 119/83 (06/02 0833) SpO2:  [94 %-99 %] 97 % (06/02 0833)  CBC:  Recent Labs  Lab 07/29/17 0611 08/04/17 0537  WBC 11.7* 10.7*  HGB 16.1 17.3*  HCT 47.7 49.5  MCV 101.3* 97.8  PLT 205 261    Basic Metabolic Panel:  Recent Labs  Lab 07/29/17 0611  08/02/17 0611 08/04/17 0537  NA 146*   < > 142 141  K 3.9  --   --  3.6  CL 116*  --   --  106  CO2 23  --   --  27  GLUCOSE 108*  --   --  107*  BUN 11  --   --  19  CREATININE 0.97  --   --  0.99  CALCIUM 8.1*  --   --  9.2  MG 2.2  --   --   --    < > = values in this interval not displayed.    Lipid Panel:     Component Value Date/Time   CHOL 172 07/28/2017 0848   TRIG 99 07/28/2017 0848   HDL 46 07/28/2017 0848   CHOLHDL 3.7 07/28/2017 0848   VLDL 20 07/28/2017 0848   LDLCALC 106 (H) 07/28/2017 0848   HgbA1c:  Lab Results  Component Value Date   HGBA1C 5.0 07/28/2017   Urine Drug Screen:     Component Value Date/Time   LABOPIA NONE DETECTED 07/28/2017 1700   COCAINSCRNUR POSITIVE (A) 07/28/2017 1700   LABBENZ NONE DETECTED 07/28/2017 1700   AMPHETMU NONE DETECTED 07/28/2017 1700   THCU NONE DETECTED 07/28/2017 1700   LABBARB NONE DETECTED 07/28/2017 1700    Alcohol Level No results found for: ETH  IMAGING I have personally reviewed the radiological images below and agree with the radiology interpretations.  Ct Angio Head W Or Wo Contrast Ct Angio Neck W Or Wo  Contrast Ct Cerebral Perfusion W Contrast 07/27/2017 IMPRESSION:  1. Large right MCA territory infarct.  CBF< 30% is 69 mL.  2. Hypoperfusion index of 0.6 consistent with very poor collaterals and rapidly progressive infarct.  3. 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  4. Marked decreased caliber of the cervical right internal carotid artery distal to the stenosis.  5. Reconstitution of the right internal carotid artery at the ophthalmic segment with some filling of the fetal type right posterior cerebral artery.  6. Occluded right M1 segment with poor collaterals.  7. Atherosclerotic changes at the left carotid bifurcation without significant stenosis.  8. Left MCA and ACA vessels are within normal limits.  9. Small basilar artery feeds the left posterior cerebral artery.   Ct Head Code Stroke Wo Contrast 07/27/2017 IMPRESSION:  1. Large right MCA territory infarct involving the insular cortex, lentiform nucleus, anterior limb internal capsule, caudate head, and right temporal lobe. This results in some mass effect with effacement of the sulci and right  lateral ventricle but no hemorrhage.  2. Hyperdense right MCA extending beyond the bifurcation compatible with thrombus.  3. Mild diffuse subcortical white matter hypoattenuation bilaterally likely reflects microvascular ischemia.  4. ASPECTS is 4/10   CT Head Wo Contrast  08/01/2017 IMPRESSION: 1. Large right MCA infarction is stable in distribution but increased in edema/mass effect. 7 mm of right-to-left midline shift, previously 4 mm. 2. No new acute stroke, hemorrhage, or focal mass effect.  MRI Brain Wo Contrast  08/02/2017 IMPRESSION: 1. Stable distribution of large right MCA early subacute infarction given differences in technique. Petechial hemorrhage within the right basal ganglia. Stable to minimally decreased mass effect with 6 mm right-to-left midline  shift. 2. No new acute intracranial abnormality identified.  Transthoracic Echocardiogram -Left ventricle: The cavity size was normal. There was mild focal   basal hypertrophy of the septum. Systolic function was normal.The estimated ejection fraction was in the range of 55% to 60%. Wall motion was normal; there were no regional wall motion abnormality    PHYSICAL EXAM  Temp:  [97.9 F (36.6 C)-98.3 F (36.8 C)] 98.1 F (36.7 C) (06/02 0833) Pulse Rate:  [67-99] 81 (06/02 0833) Resp:  [17-18] 17 (06/02 0833) BP: (106-121)/(63-89) 119/83 (06/02 0833) SpO2:  [94 %-99 %] 97 % (06/02 0833)  General - Well nourished, well developed, in no apparent distress.  Ophthalmologic - fundi not visualized due to noncooperation.  Cardiovascular - Regular rate and rhythm.  Mental Status -  Level of arousal and orientation to time, place, and person were intact. Language including expression, naming, repetition, comprehension was assessed and found intact. Fund of Knowledge was assessed and was intact.  Cranial Nerves II - XII - II - Visual field intact OU. III, IV, VI - Extraocular movements intact. V - Facial sensation intact bilaterally. VII - left mild facial droop. VIII - Hearing & vestibular intact bilaterally. X - Palate elevates symmetrically. XI - Chin turning & shoulder shrug intact bilaterally XII - Tongue protrusion intact.  Motor Strength - The patient's strength was normal in RUE and RLE, however, left UE 2/5 bicep otherwise 1/5, LLE 3/5 proximal and distal.  Bulk was normal and fasciculations were absent.   Motor Tone - Muscle tone was assessed at the neck and appendages and was normal.  Reflexes - The patient's reflexes were symmetrical in all extremities and he had no pathological reflexes.  Sensory - Light touch, temperature/pinprick were assessed and were symmetrical.    Coordination - The patient had normal movements in the right hands with no ataxia or dysmetria.   Tremor was absent.  Gait and Station - deferred.   ASSESSMENT/PLAN [name] is a 52 y.o. male with history of tobacco use, hypertension,and medical noncompliance presenting with left hemiplegia. He did not receive IV t-PA due to unknown time of onset.  Large right MCA stroke - likely A-A embolic from proximal high grade  carotid stenosis  Resultant  Left hemiparesis arm >> leg, facial droop  CT head - Large right MCA territory infarct involving the insular cortex, lentiform nucleus, anterior limb internal capsule, caudate head, and right temporal lobe  MRI head - Stable distribution of large right MCA. 6 mm right-to-left midline shift.  CTA H&N - High-grade, critical, stenosis Rt ICA.  Rt MCA compatible with thrombus.   2D Echo -normal EF  LDL - 106  HgbA1c - 5.0  VTE prophylaxis - Lovenox / SCDs  No antithrombotic prior to admission, now on aspirin 325 mg daily  Patient counseled to be compliant with his antithrombotic medications  Ongoing aggressive stroke risk factor management  Therapy recommendations:  CIR  Disposition:  Pending  Right carotid stenosis  CTA H&N - High-grade, critical, stenosis Rt ICA.  Likely the cause of current stroke  VVS consulted initially but recommend no surgery due to poor neuro status  Currently pt neuro stable and improved.  Contacted Dr. Edilia Bo Sunday - He will ask Dr Imogene Burn to re-evaluate pt. Mon AM for timing of CEA.  Cerebral edema  Was on 3% saline now off  Na now 142  MRI 08/02/17 showed stable right MCA large infarct with 6mm midline shift  d/c central line  Hypertension  Stable   BP goal at this time is 120 to 160 due to severe carotid stenosis with large stroke  Hyperlipidemia  Lipid lowering medication PTA:  none  LDL 106, goal < 70  Current lipid lowering medication: now on Lipitor 40 mg daily  Continue statin at discharge  Cocaine abuse  UDS positive for cocaine  Cessation education  provided  Pt is willing to quit  Tobacco abuse  Current smoker  Smoking cessation counseling provided  Pt is willing to quit  Other Stroke Risk Factors  ETOH use, advised to drink no more than 1 alcoholic beverage per day.  Family hx stroke (grandmother had stroke in her 29s)  Other Active Problems  Leukocytosis - improving - 11.7 -> 10.7  Hospital day #8  Marvel Plan, MD PhD Stroke Neurology 08/04/2017 4:04 PM     To contact Stroke Continuity provider, please refer to [email or web address]. After hours, contact General Neurology

## 2017-08-05 ENCOUNTER — Inpatient Hospital Stay (HOSPITAL_COMMUNITY)
Admission: RE | Admit: 2017-08-05 | Discharge: 2017-08-21 | DRG: 057 | Disposition: A | Discharge status: Home Health | Payer: Managed Care, Other (non HMO) | Source: Intra-hospital | Attending: Physical Medicine & Rehabilitation | Admitting: Physical Medicine & Rehabilitation

## 2017-08-05 ENCOUNTER — Encounter (HOSPITAL_COMMUNITY): Payer: Self-pay | Admitting: *Deleted

## 2017-08-05 ENCOUNTER — Other Ambulatory Visit: Payer: Self-pay

## 2017-08-05 DIAGNOSIS — D72829 Elevated white blood cell count, unspecified: Secondary | ICD-10-CM | POA: Diagnosis present

## 2017-08-05 DIAGNOSIS — F1410 Cocaine abuse, uncomplicated: Secondary | ICD-10-CM

## 2017-08-05 DIAGNOSIS — I63511 Cerebral infarction due to unspecified occlusion or stenosis of right middle cerebral artery: Secondary | ICD-10-CM | POA: Diagnosis not present

## 2017-08-05 DIAGNOSIS — I6521 Occlusion and stenosis of right carotid artery: Secondary | ICD-10-CM | POA: Diagnosis present

## 2017-08-05 DIAGNOSIS — G479 Sleep disorder, unspecified: Secondary | ICD-10-CM | POA: Diagnosis present

## 2017-08-05 DIAGNOSIS — Z833 Family history of diabetes mellitus: Secondary | ICD-10-CM

## 2017-08-05 DIAGNOSIS — K219 Gastro-esophageal reflux disease without esophagitis: Secondary | ICD-10-CM

## 2017-08-05 DIAGNOSIS — Z23 Encounter for immunization: Secondary | ICD-10-CM | POA: Diagnosis not present

## 2017-08-05 DIAGNOSIS — Z801 Family history of malignant neoplasm of trachea, bronchus and lung: Secondary | ICD-10-CM

## 2017-08-05 DIAGNOSIS — D72823 Leukemoid reaction: Secondary | ICD-10-CM | POA: Diagnosis not present

## 2017-08-05 DIAGNOSIS — K5900 Constipation, unspecified: Secondary | ICD-10-CM | POA: Diagnosis present

## 2017-08-05 DIAGNOSIS — R0989 Other specified symptoms and signs involving the circulatory and respiratory systems: Secondary | ICD-10-CM

## 2017-08-05 DIAGNOSIS — L732 Hidradenitis suppurativa: Secondary | ICD-10-CM | POA: Diagnosis present

## 2017-08-05 DIAGNOSIS — I69354 Hemiplegia and hemiparesis following cerebral infarction affecting left non-dominant side: Principal | ICD-10-CM

## 2017-08-05 DIAGNOSIS — Z823 Family history of stroke: Secondary | ICD-10-CM | POA: Diagnosis not present

## 2017-08-05 DIAGNOSIS — I10 Essential (primary) hypertension: Secondary | ICD-10-CM | POA: Diagnosis present

## 2017-08-05 DIAGNOSIS — I6931 Attention and concentration deficit following cerebral infarction: Secondary | ICD-10-CM

## 2017-08-05 DIAGNOSIS — I63411 Cerebral infarction due to embolism of right middle cerebral artery: Secondary | ICD-10-CM

## 2017-08-05 DIAGNOSIS — Z8041 Family history of malignant neoplasm of ovary: Secondary | ICD-10-CM | POA: Diagnosis not present

## 2017-08-05 DIAGNOSIS — Z87442 Personal history of urinary calculi: Secondary | ICD-10-CM

## 2017-08-05 DIAGNOSIS — H04123 Dry eye syndrome of bilateral lacrimal glands: Secondary | ICD-10-CM

## 2017-08-05 DIAGNOSIS — I69322 Dysarthria following cerebral infarction: Secondary | ICD-10-CM | POA: Diagnosis not present

## 2017-08-05 DIAGNOSIS — Z8042 Family history of malignant neoplasm of prostate: Secondary | ICD-10-CM | POA: Diagnosis not present

## 2017-08-05 DIAGNOSIS — K5901 Slow transit constipation: Secondary | ICD-10-CM

## 2017-08-05 DIAGNOSIS — F17200 Nicotine dependence, unspecified, uncomplicated: Secondary | ICD-10-CM

## 2017-08-05 DIAGNOSIS — E785 Hyperlipidemia, unspecified: Secondary | ICD-10-CM

## 2017-08-05 DIAGNOSIS — F17210 Nicotine dependence, cigarettes, uncomplicated: Secondary | ICD-10-CM | POA: Diagnosis present

## 2017-08-05 DIAGNOSIS — M79642 Pain in left hand: Secondary | ICD-10-CM

## 2017-08-05 DIAGNOSIS — G936 Cerebral edema: Secondary | ICD-10-CM | POA: Diagnosis present

## 2017-08-05 LAB — CBC
HCT: 52 % (ref 39.0–52.0)
HEMOGLOBIN: 18.1 g/dL — AB (ref 13.0–17.0)
MCH: 33.8 pg (ref 26.0–34.0)
MCHC: 34.8 g/dL (ref 30.0–36.0)
MCV: 97.2 fL (ref 78.0–100.0)
Platelets: 271 10*3/uL (ref 150–400)
RBC: 5.35 MIL/uL (ref 4.22–5.81)
RDW: 11.5 % (ref 11.5–15.5)
WBC: 10.3 10*3/uL (ref 4.0–10.5)

## 2017-08-05 LAB — BASIC METABOLIC PANEL
Anion gap: 9 (ref 5–15)
BUN: 18 mg/dL (ref 6–20)
CHLORIDE: 106 mmol/L (ref 101–111)
CO2: 25 mmol/L (ref 22–32)
CREATININE: 0.94 mg/dL (ref 0.61–1.24)
Calcium: 9.1 mg/dL (ref 8.9–10.3)
GFR calc Af Amer: >60 mL/min (ref >60)
GFR calc non Af Amer: >60 mL/min (ref >60)
GLUCOSE: 101 mg/dL — AB (ref 65–99)
Potassium: 3.6 mmol/L (ref 3.5–5.1)
Sodium: 140 mmol/L (ref 135–145)

## 2017-08-05 MED ORDER — DIPHENHYDRAMINE HCL 12.5 MG/5ML PO ELIX
12.5000 mg | ORAL_SOLUTION | Freq: Four times a day (QID) | ORAL | Status: DC | PRN
  Administered 2017-08-09: 25 mg via ORAL
  Filled 2017-08-05: qty 10

## 2017-08-05 MED ORDER — SENNOSIDES-DOCUSATE SODIUM 8.6-50 MG PO TABS: 1.0000 | ORAL_TABLET | Freq: Every evening | ORAL | Status: DC | PRN

## 2017-08-05 MED ORDER — PNEUMOCOCCAL VAC POLYVALENT 25 MCG/0.5ML IJ INJ
0.5000 mL | INJECTION | INTRAMUSCULAR | Status: AC
  Administered 2017-08-06: 0.5 mL via INTRAMUSCULAR
  Filled 2017-08-05 (×2): qty 0.5

## 2017-08-05 MED ORDER — ATORVASTATIN CALCIUM 40 MG PO TABS: 40.0000 mg | ORAL_TABLET | Freq: Every day | ORAL | Status: DC

## 2017-08-05 MED ORDER — POLYETHYLENE GLYCOL 3350 17 G PO PACK: 17.0000 g | PACK | Freq: Every day | ORAL | Status: DC | PRN

## 2017-08-05 MED ORDER — SENNA 8.6 MG PO TABS: 1.0000 | ORAL_TABLET | Freq: Every day | ORAL | 0 refills | Status: DC

## 2017-08-05 MED ORDER — BISACODYL 10 MG RE SUPP: 10.0000 mg | Freq: Every day | RECTAL | Status: DC | PRN

## 2017-08-05 MED ORDER — MAGNESIUM HYDROXIDE NICU ORAL SYRINGE 400 MG/5 ML
30.0000 mL | Freq: Once | ORAL | Status: DC
  Filled 2017-08-05: qty 30

## 2017-08-05 MED ORDER — FOLIC ACID 1 MG PO TABS: 1.0000 mg | ORAL_TABLET | Freq: Every day | ORAL | Status: DC

## 2017-08-05 MED ORDER — ENOXAPARIN SODIUM 40 MG/0.4ML ~~LOC~~ SOLN: 40.0000 mg | Freq: Every day | SUBCUTANEOUS | Status: DC

## 2017-08-05 MED ORDER — ENOXAPARIN SODIUM 40 MG/0.4ML ~~LOC~~ SOLN
40.0000 mg | SUBCUTANEOUS | Status: DC
  Administered 2017-08-05 – 2017-08-12 (×8): 40 mg via SUBCUTANEOUS
  Filled 2017-08-05 (×8): qty 0.4

## 2017-08-05 MED ORDER — ADULT MULTIVITAMIN W/MINERALS CH: 1.0000 | ORAL_TABLET | Freq: Every day | ORAL | Status: DC

## 2017-08-05 MED ORDER — ADULT MULTIVITAMIN W/MINERALS CH
1.0000 | ORAL_TABLET | Freq: Every day | ORAL | Status: DC
  Administered 2017-08-06 – 2017-08-21 (×16): 1 via ORAL
  Filled 2017-08-05 (×15): qty 1

## 2017-08-05 MED ORDER — PROCHLORPERAZINE EDISYLATE 10 MG/2ML IJ SOLN: 5.0000 mg | Freq: Four times a day (QID) | INTRAMUSCULAR | Status: DC | PRN

## 2017-08-05 MED ORDER — MAGNESIUM HYDROXIDE 400 MG/5ML PO SUSP
30.0000 mL | Freq: Once | ORAL | Status: AC
  Administered 2017-08-05: 30 mL via ORAL
  Filled 2017-08-05: qty 30

## 2017-08-05 MED ORDER — GUAIFENESIN-DM 100-10 MG/5ML PO SYRP: 5.0000 mL | ORAL_SOLUTION | Freq: Four times a day (QID) | ORAL | Status: DC | PRN

## 2017-08-05 MED ORDER — PANTOPRAZOLE SODIUM 40 MG PO TBEC
40.0000 mg | DELAYED_RELEASE_TABLET | Freq: Every day | ORAL | Status: DC
  Administered 2017-08-06 – 2017-08-21 (×16): 40 mg via ORAL
  Filled 2017-08-05 (×16): qty 1

## 2017-08-05 MED ORDER — SENNOSIDES-DOCUSATE SODIUM 8.6-50 MG PO TABS
2.0000 | ORAL_TABLET | Freq: Every day | ORAL | Status: DC
  Administered 2017-08-05 – 2017-08-09 (×3): 2 via ORAL
  Filled 2017-08-05 (×5): qty 2

## 2017-08-05 MED ORDER — ALUM & MAG HYDROXIDE-SIMETH 200-200-20 MG/5ML PO SUSP
30.0000 mL | ORAL | Status: DC | PRN
  Administered 2017-08-12 – 2017-08-19 (×2): 30 mL via ORAL
  Filled 2017-08-05 (×2): qty 30

## 2017-08-05 MED ORDER — BLOOD PRESSURE CONTROL BOOK
Freq: Once | Status: AC
  Administered 2017-08-05: 17:00:00
  Filled 2017-08-05: qty 1

## 2017-08-05 MED ORDER — FLEET ENEMA 7-19 GM/118ML RE ENEM: 1.0000 | ENEMA | Freq: Once | RECTAL | Status: DC | PRN

## 2017-08-05 MED ORDER — PROCHLORPERAZINE MALEATE 5 MG PO TABS: 5.0000 mg | ORAL_TABLET | Freq: Four times a day (QID) | ORAL | Status: DC | PRN

## 2017-08-05 MED ORDER — ACETAMINOPHEN 325 MG PO TABS
325.0000 mg | ORAL_TABLET | ORAL | Status: DC | PRN
  Administered 2017-08-07 – 2017-08-16 (×6): 650 mg via ORAL
  Filled 2017-08-05 (×6): qty 2

## 2017-08-05 MED ORDER — TRAZODONE HCL 50 MG PO TABS
25.0000 mg | ORAL_TABLET | Freq: Every evening | ORAL | Status: DC | PRN
  Administered 2017-08-05 – 2017-08-07 (×3): 50 mg via ORAL
  Filled 2017-08-05 (×4): qty 1

## 2017-08-05 MED ORDER — FOLIC ACID 1 MG PO TABS
1.0000 mg | ORAL_TABLET | Freq: Every day | ORAL | Status: DC
  Administered 2017-08-06 – 2017-08-21 (×16): 1 mg via ORAL
  Filled 2017-08-05 (×16): qty 1

## 2017-08-05 MED ORDER — PROCHLORPERAZINE 25 MG RE SUPP: 12.5000 mg | Freq: Four times a day (QID) | RECTAL | Status: DC | PRN

## 2017-08-05 NOTE — Progress Notes (Signed)
[name] Mage, RN  Rehab Admission Coordinator  Physical Medicine and Rehabilitation  PMR Pre-admission  Signed  Date of Service:  08/04/2017 8:50 PM       Related encounter: ED to Hosp-Admission (Discharged) from 07/27/2017 in [facility]      Signed            Show:Clear all [x] Manual[x] Template[x] Copied  Added by: [x] [name] [name], [name] [name][x] [name] Mage, RN   [] Hover for details   PMR Admission Coordinator Pre-Admission Assessment  Patient: [name] [name] is an 52 y.o., male MRN: [id number] DOB: 06/29/1965 Height: 5\' 5"  (165.1 cm) Weight: 76.3 kg (168 lb 3.2 oz)                                                                                                                                                  Insurance Information HMO:     PPO:      PCP:      IPA:      80/20:      OTHER: [facility] Open Access AETNA Plan    PRIMARYMonia [name]      Policy#: [id number]      Subscriber: Self CM Name: [name] [name]       Phone#: [phone]     Fax#: [phone] Pre-Cert#: [id number] for 7 days 08/05/17-08/11/17     Employer: Full-Time Benefits:  Phone #: [phone]     Name: Verified online at [email or web address] Eff. 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 Patient refused to come to the ED initially. UDS was positive for cocaine. CTA head neck/perfusion showed large MCA infarct with high-grade near occlusive stenosis of right-ICA at the bifurcation and occluded right M1 segment with poor collaterals. 2D echo done showed showing EF of 55 to 60% with grade 2 diastolic dysfunction and no PFO. He had declining mental status after admission requiring transfer to ICU. Dr. Evette Cristal was consulted for input  and felt that immediate surgical intervention likely not helpful with concerns of hemorrhagic conversion. Follow-up CT head showed mild mass-effect and Dr. Lovell SheehanJenkins recommended treatment with hypertonic saline. Repeat CT 5/30 showed stable infarct however increase in mass-effect and 7 mm right to left midline shift with effacement. Mentation is improving and he has been monitored conservatively.   He continues to be limited by left inattention and left sided weakness affecting mobility as well as ability to carry out ADL tasks.Therapy has been ongoing and CIR was recommended due to functional deficits.  NIH Total: 8  Past Medical History      Past Medical History:  Diagnosis Date  . GERD (gastroesophageal reflux disease)   . Hypertension   . Nephrolithiasis   . Tobacco abuse     Family History  family history includes Alcoholism in his maternal uncle; CVA in his paternal grandmother; Cancer in his paternal grandmother; Diabetes in his maternal grandmother; Lung cancer in his father; Nephrolithiasis in his father; Ovarian cancer in his mother; Prostate cancer (age of onset: 1672) in his father.  Prior Rehab/Hospitalizations:  Has the patient had major surgery during 100 days prior to admission? No  Current Medications   Current Facility-Administered Medications:  .  acetaminophen (TYLENOL) tablet 650 mg, 650 mg, Oral, Q6H PRN, Milon DikesArora, Ashish, MD, 650 mg at 08/04/17 1239 .  enoxaparin  (LOVENOX) injection 40 mg, 40 mg, Subcutaneous, Daily, Micki RileySethi, Pramod S, MD, 40 mg at 08/05/17 0927 .  folic acid (FOLVITE) tablet 1 mg, 1 mg, Oral, Daily, Micki RileySethi, Pramod S, MD, 1 mg at 08/05/17 0927 .  multivitamin with minerals tablet 1 tablet, 1 tablet, Oral, Daily, Micki RileySethi, Pramod S, MD, 1 tablet at 08/05/17 [phone] .  pantoprazole (PROTONIX) EC tablet 40 mg, 40 mg, Oral, Daily, Marvel PlanXu, Jindong, MD, 40 mg at 08/05/17 0927 .  senna (SENOKOT) tablet 8.6 mg, 1 tablet, Oral, QHS, Marvel PlanXu, Jindong, MD, 8.6 mg at 08/04/17 2141 .  senna-docusate (Senokot-S) tablet 1 tablet, 1 tablet, Oral, QHS PRN, Micki RileySethi, Pramod S, MD, 1 tablet at 08/04/17 1239 .  sodium phosphate (FLEET) 7-19 GM/118ML enema 1 enema, 1 enema, Rectal, Once, Marvel PlanXu, Jindong, MD  Patients Current Diet:       Diet Order           Diet Heart Room service appropriate? Yes; Fluid consistency: Thin  Diet effective now          Precautions / Restrictions Precautions Precautions: Fall Precaution Comments: l sided inattention still present but appears to be improving--pt will say he needs to look to his left and only needs min VCs to do so Restrictions Weight Bearing Restrictions: No   Has the patient had 2 or more falls or a fall with injury in the past year?No  Prior Activity Level Community (5-7x/wk): Prior to admission patient was working full-time as an Arboriculturistequipment operator, active, and fully independent.  Home Assistive Devices / Equipment Home Assistive Devices/Equipment: None Home Equipment: None  Prior Device Use: Indicate devices/aids used by the patient prior to current illness, exacerbation or injury? None of the above  Prior Functional Level Prior Function Level of Independence: Independent  Self Care: Did the patient need help bathing, dressing, using the toilet or eating? Independent  Indoor Mobility: Did the patient need assistance with walking from room to room (with or without device)? Independent  Stairs: Did  the patient need assistance with internal or external stairs (with or without device)? Independent  Functional Cognition: Did the patient need help planning  regular tasks such as shopping or remembering to take medications? Independent  Current Functional Level Cognition  Arousal/Alertness: Awake/alert Overall Cognitive Status: Impaired/Different from baseline Current Attention Level: Selective Orientation Level: Oriented X4 Following Commands: Follows one step commands consistently Safety/Judgement: Decreased awareness of safety, Decreased awareness of deficits General Comments: Patient with improvements in attention and task performance; more attentive to LUE today post intial cuing Attention: Selective, Sustained Sustained Attention: Impaired Sustained Attention Impairment: Verbal complex, Functional complex Selective Attention: Impaired Selective Attention Impairment: Verbal complex, Functional complex Memory: Impaired Memory Impairment: Retrieval deficit, Decreased recall of new information, Decreased short term memory Decreased Short Term Memory: Verbal basic, Functional basic Awareness: Impaired Awareness Impairment: Emergent impairment, Anticipatory impairment, Intellectual impairment Problem Solving: Impaired Problem Solving Impairment: Verbal basic, Functional basic Executive Function: Reasoning, Organizing, Decision Making, Initiating, Self Monitoring, Self Correcting Reasoning: Impaired Reasoning Impairment: Verbal basic, Functional basic Organizing: Impaired Organizing Impairment: Verbal basic, Functional basic Decision Making: Impaired Decision Making Impairment: Verbal basic, Functional basic Initiating: Impaired Initiating Impairment: Verbal basic, Functional basic Self Monitoring: Impaired Self Monitoring Impairment: Verbal basic, Functional basic Self Correcting: Impaired Self Correcting Impairment: Verbal basic, Functional basic Behaviors: Restless,  Impulsive, Poor frustration tolerance Safety/Judgment: Impaired    Extremity Assessment (includes Sensation/Coordination)  Upper Extremity Assessment: RUE deficits/detail LUE Deficits / Details: brunstrom I flaccid pt with sessation to tactile input. pt showed awareness to L UE and states "i have to move it before i move so i dont roll on it"  LUE Sensation: decreased light touch, decreased proprioception LUE Coordination: decreased fine motor, decreased gross motor  Lower Extremity Assessment: Defer to PT evaluation LLE Deficits / Details: Initially no voluntary movement.  On standing, pt able to w/bear through the left LE and show some control of locking and unlocking the knee in w/bearing. LLE Coordination: decreased fine motor    ADLs  Overall ADL's : Needs assistance/impaired Eating/Feeding: Supervision/ safety, Sitting Eating/Feeding Details (indicate cue type and reason): only to make sure he finds everything on his tray Grooming: Wash/dry hands, Wash/dry face, Oral care, Brushing hair, Minimal assistance, Standing Grooming Details (indicate cue type and reason): at sink with Stedy Upper Body Bathing: Moderate assistance Lower Body Bathing: Maximal assistance Lower Body Dressing: Moderate assistance(sitting EOB to don socks) Lower Body Dressing Details (indicate cue type and reason): pt able to place R sock on Le with (A) to hook big toe. pt requires total (A) for L LE Toilet Transfer: Minimal assistance, +2 for physical assistance, Ambulation Toilet Transfer Details (indicate cue type and reason): pt able to progress bil LE with ataxic L LE movement. pt scissoring Functional mobility during ADLs: +2 for physical assistance, Minimal assistance General ADL Comments: pt with scissoring and narrowed gait with transfer. pt needed max cues for sequence and L LE placement/ safety. pt needs faciliation of R weight shift to advance LLE    Mobility  Overal bed mobility: Needs  Assistance Bed Mobility: Supine to Sit Supine to sit: Min guard Sit to supine: Mod assist General bed mobility comments: Min guard for safety    Transfers  Overall transfer level: Needs assistance Equipment used: Rolling walker (2 wheeled) Transfer via Lift Equipment: Stedy Transfers: Sit to/from [employer] to Stand: Min assist, +2 physical assistance Stand pivot transfers: Min assist, Mod assist, +2 physical assistance  Lateral/Scoot Transfers: Min assist, +2 safety/equipment General transfer comment: Min assist with use of stedy to for upright activity    Ambulation / Gait / Stairs / Psychologist, prison and probation services  Ambulation/Gait  Ambulation/Gait assistance: Mod assist, +2 physical assistance Ambulation Distance (Feet): 10 Feet Assistive device: 2 person hand held assist Gait Pattern/deviations: Step-to pattern General Gait Details: VCs for step initiation and quad setting during loading response. Moderate assist for stability with LUE supported Gait velocity: slow Gait velocity interpretation: <1.31 ft/sec, indicative of household ambulator    Posture / Balance Dynamic Sitting Balance Sitting balance - Comments: able to maintain with some dynamic movements at EOB Balance Overall balance assessment: Needs assistance Sitting-balance support: No upper extremity supported, Feet supported Sitting balance-Leahy Scale: Fair Sitting balance - Comments: able to maintain with some dynamic movements at EOB Standing balance support: Bilateral upper extremity supported, During functional activity Standing balance-Leahy Scale: Poor Standing balance comment: able to perform upright activity with LLE blocked out by stedy knee support. Assist to maintain position of LUE    Special needs/care consideration BiPAP/CPAP: No CPM: No Continuous Drip IV: No Dialysis: No         Life Vest: No Oxygen: No Special Bed: No Trach Size: No Wound Vac (area): No       Skin: WDL                                Bowel mgmt:Continent, last BM 08/04/17 Bladder mgmt: Incontinent with external foley in place for management  Diabetic mgmt No, HgbA1c 5.0    Previous Home Environment Living Arrangements: Alone  Lives With: Alone Available Help at Discharge: Family, Available PRN/intermittently Type of Home: Mobile home Home Layout: One level Home Access: Stairs to enter Secretary/administrator of Steps: ALOT ( HOUSE IS ON A HILL PER PATIENT) Bathroom Shower/Tub: Engineer, manufacturing systems: Standard Home Care Services: No Additional Comments: 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 sister providing details of home setup .  has two sisters and  [name] [name] is a neighbor that helps take care of his mobile home when he is away for work  Discharge Living Setting Plans for Discharge Living Setting: Patient's home Type of Home at Discharge: House Discharge Home Layout: One level Discharge Home Access: Stairs to enter Entrance Stairs-Rails: Can reach both Entrance Stairs-Number of Steps: 10 Discharge Bathroom Shower/Tub: Walk-in shower Discharge Bathroom Toilet: Standard Discharge Bathroom Accessibility: Yes How Accessible: Accessible via walker Does the patient have any problems obtaining your medications?: No  Social/Family/Support Systems Patient Roles: Other (Comment)(Brother, uncle, and nephew) Contact Information: Sister: [name] [name]  Anticipated Caregiver: Family to provide 24/7 and [name] [name] to coordinate  Anticipated Caregiver's Contact Information: See above  Ability/Limitations of Caregiver: None Caregiver Availability: 24/7 Discharge Plan Discussed with Primary Caregiver: Yes Is Caregiver In Agreement with Plan?: Yes Does Caregiver/Family have Issues with Lodging/Transportation while Pt is in Rehab?: No  Goals/Additional Needs Patient/Family Goal for Rehab: PT: Mod I-Supervision; OT: Mod I-Supervision-Min A; SLP: Mod I  Expected length  of stay: 18-24 days  Cultural Considerations: None Dietary Needs: Heart Healthy diet restrictions  Equipment Needs: TBD Special Service Needs: None Pt/Family Agrees to Admission and willing to participate: Yes Program Orientation Provided & Reviewed with Pt/Caregiver Including Roles  & Responsibilities: Yes  Decrease burden of Care through IP rehab admission: No   Possible need for SNF placement upon discharge: No  Patient Condition: This patient's medical and functional status has changed since the consult dated: 08/01/17 in which the Rehabilitation Physician determined and documented that the patient's condition is appropriate for intensive rehabilitative  care in an inpatient rehabilitation facility. See "History of Present Illness" (above) for medical update. Functional changes are: Currently requiring mod assist +2 HHA to ambulate 10 feet. Patient's medical and functional status update has been discussed with the Rehabilitation physician and patient remains appropriate for inpatient rehabilitation. Will admit to inpatient rehab today.  Preadmission Screen Completed By:  [name] [name], 08/05/2017 1:47 PM ______________________________________________________________________   Discussed status with Dr. Allena Katz on 08/05/17 at 1347 and received telephone approval for admission today.  Admission Coordinator:  [name] [name], time 1347/Date 08/05/17             Cosigned by: Marcello Fennel, MD at 08/05/2017 2:01 PM  Revision History

## 2017-08-05 NOTE — Progress Notes (Signed)
Occupational Therapy Treatment Patient Details Name: [name] [name] MRN: [id number] DOB: 09/10/1965 Today's Date: 08/05/2017    History of present illness 52 yo male with PMH significant for HTN, tobacoo abuse admitted with R MCA infarct   OT comments  This 52 yo male admitted and found to have above presents to acute OT with making progress in left attention and grooming in standing (Stedy behind him for support if needed). He will continue to benefit from acute OT with follow up OT on CIR to get to an Independent to Mod I level.  Follow Up Recommendations  CIR;Supervision/Assistance - 24 hour    Equipment Recommendations  Other (comment)(TBD at next venue)    Recommendations for Other Services Rehab consult    Precautions / Restrictions Precautions Precautions: Fall Precaution Comments: l sided inattention still present but appears to be improving--pt will say he needs to look to his left and only needs min VCs to do so Restrictions Weight Bearing Restrictions: No       Mobility Bed Mobility Overal bed mobility: Needs Assistance Bed Mobility: Supine to Sit     Supine to sit: Min guard     General bed mobility comments: Min guard for safety  Transfers Overall transfer level: Needs assistance   Transfers: Sit to/from Stand Sit to Stand: Min assist;+2 physical assistance         General transfer comment: Min assist with use of stedy to for upright activity    Balance Overall balance assessment: Needs assistance Sitting-balance support: No upper extremity supported;Feet supported Sitting balance-Leahy Scale: Fair Sitting balance - Comments: able to maintain with some dynamic movements at EOB   Standing balance support: Bilateral upper extremity supported;During functional activity Standing balance-Leahy Scale: Poor Standing balance comment: able to perform upright activity with LLE blocked out by stedy knee support. Assist to maintain position of LUE                           ADL either performed or assessed with clinical judgement   ADL Overall ADL's : Needs assistance/impaired Eating/Feeding: Supervision/ safety;Sitting Eating/Feeding Details (indicate cue type and reason): only to make sure he finds everything on his tray Grooming: Wash/dry hands;Wash/dry face;Oral care;Brushing hair;Minimal assistance;Standing Grooming Details (indicate cue type and reason): at sink with [employer] Transfer: Minimal assistance;+2 for physical assistance;Ambulation                   Vision Baseline Vision/History: Wears glasses Wears Glasses: Reading only Additional Comments: Decreased attention to left, doing better about looking left without as many cues          Cognition Arousal/Alertness: Awake/alert Behavior During Therapy: Impulsive Overall Cognitive Status: Impaired/Different from baseline Area of Impairment: Orientation;Attention;Following commands;Safety/judgement;Awareness;Problem solving                 Orientation Level: Time(May, Wesely Long) Current Attention Level: Selective   Following Commands: Follows one step commands consistently Safety/Judgement: Decreased awareness of safety;Decreased awareness of deficits Awareness: Emergent Problem Solving: Requires verbal cues General Comments: Patient with improvements in attention and task performance; more attentive to LUE today post intial cuing        Exercises Exercises: Other exercises Other Exercises Other Exercises: Tolerated dynamic standing activities at counter with use of stedy for LE support and LLE knee block in weight bearing  Pertinent Vitals/ Pain       Pain Assessment: No/denies pain         Frequency  Min 3X/week        Progress Toward Goals  OT Goals(current goals can now be found in the care plan section)  Progress towards OT goals: Progressing toward goals  Acute Rehab OT Goals Patient  Stated Goal: to get better  Plan Discharge plan remains appropriate    Co-evaluation    PT/OT/SLP Co-Evaluation/Treatment: Yes Reason for Co-Treatment: Complexity of the patient's impairments (multi-system involvement);To address functional/ADL transfers PT goals addressed during session: Mobility/safety with mobility OT goals addressed during session: ADL's and self-care;Strengthening/ROM      AM-PAC PT "6 Clicks" Daily Activity     Outcome Measure   Help from another person eating meals?: A Little Help from another person taking care of personal grooming?: A Little Help from another person toileting, which includes using toliet, bedpan, or urinal?: A Lot Help from another person bathing (including washing, rinsing, drying)?: A Lot Help from another person to put on and taking off regular upper body clothing?: A Lot Help from another person to put on and taking off regular lower body clothing?: A Lot 6 Click Score: 14    End of Session Equipment Utilized During Treatment: Gait belt  OT Visit Diagnosis: Unsteadiness on feet (R26.81);Muscle weakness (generalized) (M62.81);Other symptoms and signs involving cognitive function;Hemiplegia and hemiparesis Hemiplegia - Right/Left: Left Hemiplegia - dominant/non-dominant: Non-Dominant Hemiplegia - caused by: Cerebral infarction   Activity Tolerance Patient tolerated treatment well   Patient Left in chair;with call bell/phone within reach;with chair alarm set   Nurse Communication          Time: [id number] OT Time Calculation (min): 25 min  Charges: OT General Charges $OT Visit: 1 Visit OT Treatments $Self Care/Home Management : 8-22 mins  [name], OTR/L [phone] 08/05/2017

## 2017-08-05 NOTE — Progress Notes (Signed)
Admit to unit via w/c. Oriented to unit, plan of care and rehab schedule/routine. States an understanding of information reviewed. [name] [name], [name] [name]

## 2017-08-05 NOTE — Progress Notes (Signed)
  Speech Language Pathology Treatment: Cognitive-Linquistic  Patient Details Name: [name] [name] MRN: [id number] DOB: 07/19/1965 Today's Date: 08/05/2017 Time: [id number] SLP Time Calculation (min) (ACUTE ONLY): 16 min  Assessment / Plan / Recommendation Clinical Impression  Skilled treatment session focused on cognition goals. SLP received pt in bed and he appeared very frustrated/irritated. He states that he has to pee but it will wet the bed despite having condom cath in place. Pt very impulsive and attempted to sit edge of bed and "untwisted" catheter. SLP provided ineffective assistance, nursing help requested. Pt required Max A cues to decreased frustration and wait for nursing before "wetting the bed." Pt relunctantly agreed. Session ended when nursing arrived to reposition catheter.    HPI HPI: EdwardMoseris a 52 y.o.malewith a known history of GERD, HTN, nephrolithiasis, tobacco dependencewas in a usual state of health until this morning when he woke up at 7am with left sided weakness.Was found by family who was unable to lift him into bed. 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 Thought to not be a good candidate for thrombectomy due to prolonged time from onset.  At baseline patient works for a [employer], functionally independent. Admits to drinking a pint of whiskey daily.       SLP Plan  Continue with current plan of care                       General recommendations: Rehab consult Oral Care Recommendations: Oral care BID Follow up Recommendations: Inpatient Rehab SLP Visit Diagnosis: Cognitive communication deficit (M84.132(R41.841) Plan: Continue with current plan of care       GO                [name] [name] 08/05/2017, 1:38 PM

## 2017-08-05 NOTE — H&P (Signed)
Physical Medicine and Rehabilitation Admission H&P    Chief Complaint  Patient presents with  . Functional deficits due to stroke    HPI: [name] [name]. [name] [name] is a 52 year old right-handed male with history of hypertension, tobacco abuse, alcohol abuse, GERD who was admitted on 07/27/2017 with left-sided weakness that started early in a.m.  Patient refused to come to the ED initially.  UDS was positive for cocaine.  CTA head neck/perfusion showed large MCA infarct with high-grade near occlusive stenosis of right-ICA at the bifurcation and occluded right M1 segment with poor collaterals.  R-MCA compatible thrombus. 2D echo done showed showing EF of 55 to 60% with grade 2 diastolic dysfunction and no PFO.  He had declining mental status after admission requiring transfer to ICU.  Dr. Bridgett Larsson was consulted for input and felt that immediate surgical intervention likely not helpful with concerns of hemorrhagic conversion.  Follow-up CT head showed mild mass-effect and Dr. Arnoldo Morale recommended treatment with hypertonic saline.  Repeat CT 5/30 showed stable infarct however increase in mass-effect and 7 mm right to left midline shift with effacement.    Lethargy resolving and he has been monitored conservatively.  He continues to be limited by left inattention and left sided weakness affecting mobility as well as ability to carry out ADL tasks. Therapy has been ongoing and CIR was recommended due to functional deficits.  Review of Systems  Constitutional: Positive for malaise/fatigue (unable to sleep at nights). Negative for chills and fever.  HENT: Negative for hearing loss and tinnitus.   Eyes: Negative for blurred vision and double vision.  Respiratory: Positive for cough. Negative for sputum production and shortness of breath.   Cardiovascular: Negative for chest pain and palpitations.  Gastrointestinal: Positive for constipation and heartburn.  Genitourinary: Negative for dysuria and urgency.    Musculoskeletal: Positive for joint pain (bilateral knees).  Neurological: Positive for speech change and focal weakness.  Psychiatric/Behavioral: The patient has insomnia.   All other systems reviewed and are negative.    Past Medical History:  Diagnosis Date  . GERD (gastroesophageal reflux disease)   . Hypertension   . Nephrolithiasis   . Tobacco abuse     Past Surgical History:  Procedure Laterality Date  . CYSTOSCOPY W/ URETERAL STENT PLACEMENT Bilateral 09/07/2013   Procedure: CYSTOSCOPY WITH RETROGRADE PYELOGRAM/URETERAL STENT PLACEMENT;  Surgeon: Alexis Frock, MD;  Location: [facility];  Service: Urology;  Laterality: Bilateral;  . CYSTOSCOPY WITH RETROGRADE PYELOGRAM, URETEROSCOPY AND STENT PLACEMENT Bilateral 09/09/2013   Procedure: CYSTOSCOPY WITH RETROGRADE PYELOGRAM, DIAGNOSTIC URETEROSCOPY AND STENT CHANGE;  Surgeon: Alexis Frock, MD;  Location: [facility];  Service: Urology;  Laterality: Bilateral;  . HOLMIUM LASER APPLICATION Right 06/12/4494   Procedure: HOLMIUM LASER APPLICATION;  Surgeon: Alexis Frock, MD;  Location: [facility];  Service: Urology;  Laterality: Right;  . HOLMIUM LASER APPLICATION Right 09/06/9161   Procedure: HOLMIUM LASER APPLICATION;  Surgeon: Alexis Frock, MD;  Location: [facility];  Service: Urology;  Laterality: Right;  . NEPHROLITHOTOMY Right 09/07/2013   Procedure: FIRST STAGE RIGHT PERCUTANEOUS NEPHROLITHOTOMY  WITH ACCESS, ;  Surgeon: Alexis Frock, MD;  Location: [facility];  Service: Urology;  Laterality: Right;  . NEPHROLITHOTOMY Right 09/09/2013   Procedure: RIGHT 2ND STAGE NEPHROLITHOTOMY PERCUTANEOUS;  Surgeon: Alexis Frock, MD;  Location: [facility];  Service: Urology;  Laterality: Right;  . UMBILICAL HERNIA REPAIR      Family History  Problem Relation Age of Onset  . Prostate cancer Father 90  . Lung cancer Father   .  Nephrolithiasis Father   . Ovarian cancer Mother   . CVA Paternal Grandmother        in her 61's  . Cancer Paternal Grandmother         eye  . Diabetes Maternal Grandmother   . Alcoholism Maternal Uncle        x 3    Social History:  Lives alone and independent PTA. Does construction work. He reports that he has been smoking cigarettes about 1 PPD. He does not use smokeless tobacco. He drinks a pint of whiskey daily. Uses cocaine and marijuana occasionally.     Allergies: No Known Allergies    Medications Prior to Admission  Medication Sig Dispense Refill  . lisinopril-hydrochlorothiazide (PRINZIDE,ZESTORETIC) 20-25 MG tablet Take 1 tablet by mouth daily.    . hydrocortisone (ANUSOL-HC) 25 MG suppository Place 1 suppository (25 mg total) rectally at bedtime. For 5 nights then as needed for hemorrhoids (Patient not taking: Reported on 07/28/2017) 24 suppository 0  . HYDROmorphone (DILAUDID) 2 MG tablet Take 1 tablet (2 mg total) by mouth every 4 (four) hours as needed for moderate pain or severe pain. Post-operatively (Patient not taking: Reported on 07/28/2017) 30 tablet 0  . senna-docusate (SENOKOT-S) 8.6-50 MG per tablet Take 1 tablet by mouth 2 (two) times daily. While taking pain meds to prevent constipation (Patient not taking: Reported on 07/28/2017) 30 tablet 0    Drug Regimen Review  Drug regimen was reviewed and remains appropriate with no significant issues identified  Home: Home Living Family/patient expects to be discharged to:: Private residence Living Arrangements: Alone Available Help at Discharge: Family, Available PRN/intermittently Type of Home: Mobile home Home Access: Stairs to enter Entrance Stairs-Number of Steps: ALOT ( HOUSE IS ON A HILL PER PATIENT) Home Layout: One level Bathroom Shower/Tub: Chiropodist: Standard Home Equipment: None Additional Comments: 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 sister providing details of home setup .  has two sisters and  joan brown is a neighbor that helps take care of his mobile home when  he is away for work  Lives With: Alone   Functional History: Prior Function Level of Independence: Independent  Functional Status:  Mobility: Bed Mobility Overal bed mobility: Needs Assistance Bed Mobility: Supine to Sit Supine to sit: Min guard Sit to supine: Mod assist General bed mobility comments: Min guard for safety Transfers Overall transfer level: Needs assistance Equipment used: Rolling walker (2 wheeled) Transfer via Lift Equipment: Stedy Transfers: Sit to/from [employer] to Stand: Min assist, +2 physical assistance Stand pivot transfers: Min assist, Mod assist, +2 physical assistance  Lateral/Scoot Transfers: Min assist, +2 safety/equipment General transfer comment: Min assist with use of stedy to for upright activity Ambulation/Gait Ambulation/Gait assistance: Mod assist, +2 physical assistance Ambulation Distance (Feet): 10 Feet Assistive device: 2 person hand held assist Gait Pattern/deviations: Step-to pattern General Gait Details: VCs for step initiation and quad setting during loading response. Moderate assist for stability with LUE supported Gait velocity: slow Gait velocity interpretation: <1.31 ft/sec, indicative of household ambulator    ADL: ADL Overall ADL's : Needs assistance/impaired Eating/Feeding: Supervision/ safety, Sitting Eating/Feeding Details (indicate cue type and reason): only to make sure he finds everything on his tray Grooming: Wash/dry hands, Wash/dry face, Oral care, Brushing hair, Minimal assistance, Standing Grooming Details (indicate cue type and reason): at sink with Stedy Upper Body Bathing: Moderate assistance Lower Body Bathing: Maximal assistance Lower Body Dressing: Moderate assistance(sitting  EOB to don socks) Lower Body Dressing Details (indicate cue type and reason): pt able to place R sock on Le with (A) to hook big toe. pt requires total (A) for L LE Toilet Transfer: Minimal assistance, +2 for physical assistance,  Ambulation Toilet Transfer Details (indicate cue type and reason): pt able to progress bil LE with ataxic L LE movement. pt scissoring Functional mobility during ADLs: +2 for physical assistance, Minimal assistance General ADL Comments: pt with scissoring and narrowed gait with transfer. pt needed max cues for sequence and L LE placement/ safety. pt needs faciliation of R weight shift to advance LLE  Cognition: Cognition Overall Cognitive Status: Impaired/Different from baseline Arousal/Alertness: Awake/alert Orientation Level: Oriented X4 Attention: Selective, Sustained Sustained Attention: Impaired Sustained Attention Impairment: Verbal complex, Functional complex Selective Attention: Impaired Selective Attention Impairment: Verbal complex, Functional complex Memory: Impaired Memory Impairment: Retrieval deficit, Decreased recall of new information, Decreased short term memory Decreased Short Term Memory: Verbal basic, Functional basic Awareness: Impaired Awareness Impairment: Emergent impairment, Anticipatory impairment, Intellectual impairment Problem Solving: Impaired Problem Solving Impairment: Verbal basic, Functional basic Executive Function: Reasoning, Organizing, Decision Making, Initiating, Self Monitoring, Self Correcting Reasoning: Impaired Reasoning Impairment: Verbal basic, Functional basic Organizing: Impaired Organizing Impairment: Verbal basic, Functional basic Decision Making: Impaired Decision Making Impairment: Verbal basic, Functional basic Initiating: Impaired Initiating Impairment: Verbal basic, Functional basic Self Monitoring: Impaired Self Monitoring Impairment: Verbal basic, Functional basic Self Correcting: Impaired Self Correcting Impairment: Verbal basic, Functional basic Behaviors: Restless, Impulsive, Poor frustration tolerance Safety/Judgment: Impaired Cognition Arousal/Alertness: Awake/alert Behavior During Therapy: Impulsive Overall  Cognitive Status: Impaired/Different from baseline Area of Impairment: Orientation, Attention, Following commands, Safety/judgement, Awareness, Problem solving Orientation Level: Time(May, Wesely Long) Current Attention Level: Selective Following Commands: Follows one step commands consistently Safety/Judgement: Decreased awareness of safety, Decreased awareness of deficits Awareness: Emergent Problem Solving: Requires verbal cues General Comments: Patient with improvements in attention and task performance; more attentive to LUE today post intial cuing   Blood pressure 115/67, pulse 67, temperature 97.8 F (36.6 C), temperature source Oral, resp. rate 16, height '5\' 5"'  (1.651 m), weight 76.3 kg (168 lb 3.2 oz), SpO2 99 %. Physical Exam  Nursing note and vitals reviewed. Constitutional: He appears well-developed and well-nourished. He is easily aroused.  HENT:  Head: Normocephalic and atraumatic.  Eyes: EOM are normal. Right eye exhibits no discharge. Left eye exhibits no discharge.  Neck: Normal range of motion. Neck supple.  Cardiovascular: Normal rate and regular rhythm.  Respiratory: Effort normal and breath sounds normal.  GI: Soft. Bowel sounds are normal.  Musculoskeletal:  No edema or tenderness in extremities  Neurological: He is alert and easily aroused.  Minimal verbal output.  He was able to follow simple motor commands.  Left facial weakness with mild dysarthria.  Left inattention  LUE: 0/5 proximal to distal LLE: 2-/5 proximal to distal Sensation intact to light touch  Skin: Skin is warm and dry.  Psychiatric: His affect is blunt.    Results for orders placed or performed during the hospital encounter of 07/27/17 (from the past 48 hour(s))  CBC     Status: Abnormal   Collection Time: 08/04/17  5:37 AM  Result Value Ref Range   WBC 10.7 (H) 4.0 - 10.5 K/uL   RBC 5.06 4.22 - 5.81 MIL/uL   Hemoglobin 17.3 (H) 13.0 - 17.0 g/dL   HCT 49.5 39.0 - 52.0 %   MCV 97.8  78.0 - 100.0 fL   MCH 34.2 (H) 26.0 - 34.0  pg   MCHC 34.9 30.0 - 36.0 g/dL   RDW 11.7 11.5 - 15.5 %   Platelets 261 150 - 400 K/uL    Comment: Performed at [facility], [place] [address] [place], [place] [address]  Basic metabolic panel     Status: Abnormal   Collection Time: 08/04/17  5:37 AM  Result Value Ref Range   Sodium 141 135 - 145 mmol/L   Potassium 3.6 3.5 - 5.1 mmol/L   Chloride 106 101 - 111 mmol/L   CO2 27 22 - 32 mmol/L   Glucose, Bld 107 (H) 65 - 99 mg/dL   BUN 19 6 - 20 mg/dL   Creatinine, Ser 0.99 0.61 - 1.24 mg/dL   Calcium 9.2 8.9 - 10.3 mg/dL   GFR calc non Af Amer >60 >60 mL/min   GFR calc Af Amer >60 >60 mL/min    Comment: (NOTE) The eGFR has been calculated using the CKD EPI equation. This calculation has not been validated in all clinical situations. eGFR's persistently <60 mL/min signify possible Chronic Kidney Disease.    Anion gap 8 5 - 15    Comment: Performed at [facility] [address], [place], [place] [address]  CBC     Status: Abnormal   Collection Time: 08/05/17  6:20 AM  Result Value Ref Range   WBC 10.3 4.0 - 10.5 K/uL   RBC 5.35 4.22 - 5.81 MIL/uL   Hemoglobin 18.1 (H) 13.0 - 17.0 g/dL   HCT 52.0 39.0 - 52.0 %   MCV 97.2 78.0 - 100.0 fL   MCH 33.8 26.0 - 34.0 pg   MCHC 34.8 30.0 - 36.0 g/dL   RDW 11.5 11.5 - 15.5 %   Platelets 271 150 - 400 K/uL    Comment: Performed at [facility], [place] [address], [place], [place] [address]  Basic metabolic panel     Status: Abnormal   Collection Time: 08/05/17  6:20 AM  Result Value Ref Range   Sodium 140 135 - 145 mmol/L   Potassium 3.6 3.5 - 5.1 mmol/L   Chloride 106 101 - 111 mmol/L   CO2 25 22 - 32 mmol/L   Glucose, Bld 101 (H) 65 - 99 mg/dL   BUN 18 6 - 20 mg/dL   Creatinine, Ser 0.94 0.61 - 1.24 mg/dL   Calcium 9.1 8.9 - 10.3 mg/dL   GFR calc non Af Amer >60 >60 mL/min   GFR calc Af Amer >60 >60 mL/min    Comment: (NOTE) The eGFR has been calculated using the CKD  EPI equation. This calculation has not been validated in all clinical situations. eGFR's persistently <60 mL/min signify possible Chronic Kidney Disease.    Anion gap 9 5 - 15    Comment: Performed at [facility] [address], [place], [place] [address]   No results found.     Medical Problem List and Plan: 1.  Left inattention and left sided weakness affecting mobility as well as ability to carry out ADL tasks secondary to right MCA infarct. 2.  DVT Prophylaxis/Anticoagulation: Pharmaceutical: Lovenox 3. Pain Management: tylenol prn.  4. Mood: LCSW to follow for evaluation and support.  5. Neuropsych: This patient is capable of making decisions on his own behalf. 6. Skin/Wound Care: Routine pressure relief measures.  7. Fluids/Electrolytes/Nutrition: Monitor I/O. Check lytes in am.  8. HTN: Monitor BP bid. Was on Lisinopril/HCTZ 20-25 daily at home 9. Constipation: MOM today. Will augment  bowel program. 10 GERD: Continue Protonix.   11. Tobacco/polysubstance  abuse: Encourage cessation. IS while awake to help with cough.  12. Right ICA stenosis: CEA in 4 weeks  Post Admission Physician Evaluation: 1. Preadmission assessment reviewed and changes made below. 2. Functional deficits secondary  to right MCA infarct.. 3. Patient is admitted to receive collaborative, interdisciplinary care between the physiatrist, rehab nursing staff, and therapy team. 4. Patient's level of medical complexity and substantial therapy needs in context of that medical necessity cannot be provided at a lesser intensity of care such as a SNF. 5. Patient has experienced substantial functional loss from his/her baseline which was documented above under the "Functional History" and "Functional Status" headings.  Judging by the patient's diagnosis, physical exam, and functional history, the patient has potential for functional progress which will result in measurable gains while on inpatient rehab.  These  gains will be of substantial and practical use upon discharge  in facilitating mobility and self-care at the household level. 2. Physiatrist will provide 24 hour management of medical needs as well as oversight of the therapy plan/treatment and provide guidance as appropriate regarding the interaction of the two. 7. 24 hour rehab nursing will assist with safety, disease management and patient education  and help integrate therapy concepts, techniques,education, etc. 8. PT will assess and treat for/with: Lower extremity strength, range of motion, stamina, balance, functional mobility, safety, adaptive techniques and equipment, wound care, coping skills, pain control, stroke education. Goals are: Min A. 9. OT will assess and treat for/with: ADL's, functional mobility, safety, upper extremity strength, adaptive techniques and equipment, wound mgt, ego support, and community reintegration.   Goals are: Min A. Therapy may proceed with showering this patient. 10. SLP will assess and treat for/with: cognition.  Goals are: Mod I/Supervision. 11. Case Management and Social Worker will assess and treat for psychological issues and discharge planning. 12. Team conference will be held weekly to assess progress toward goals and to determine barriers to discharge. 13. Patient will receive at least 3 hours of therapy per day at least 5 days per week. 14. ELOS: 13-16 days.       15. Prognosis:  good  I have personally performed a face to face diagnostic evaluation, including, but not limited to relevant history and physical exam findings, of this patient and developed relevant assessment and plan.  Additionally, I have reviewed and concur with the physician assistant's documentation above.  Delice Lesch, MD, ABPMR Bary Leriche, PA-C 08/05/2017

## 2017-08-05 NOTE — Progress Notes (Signed)
Physical Therapy Treatment Patient Details Name: [name] [name] MRN: [id number] DOB: 1965/11/03 Today's Date: 08/05/2017    History of Present Illness 52 yo male with PMH significant for HTN, tobacoo abuse admitted with R MCA infarct    PT Comments    Patient seen for activity progression, continues to make steady gains at this time. Today, patient tolerated multiple activities in standing/weight bearing activities with use of equipment for knee blocking. Patient able to perform multiple transfers and shows improvements in attention and overall function. CIR remains appropriate at this time. Will continue to see and progress as tolerated.    Follow Up Recommendations  CIR     Equipment Recommendations  Rolling walker with 5" wheels    Recommendations for Other Services Rehab consult     Precautions / Restrictions Precautions Precautions: Fall Precaution Comments: l sided inattention still present but appears to be improving Restrictions Weight Bearing Restrictions: No    Mobility  Bed Mobility Overal bed mobility: Needs Assistance Bed Mobility: Supine to Sit     Supine to sit: Min guard     General bed mobility comments: Min guard for safety  Transfers Overall transfer level: Needs assistance   Transfers: Sit to/from Stand Sit to Stand: Min assist;+2 physical assistance         General transfer comment: Min assist with use of stedy to for upright activity  Ambulation/Gait Ambulation/Gait assistance: Mod assist;+2 physical assistance Ambulation Distance (Feet): 10 Feet Assistive device: 2 person hand held assist Gait Pattern/deviations: Step-to pattern Gait velocity: slow Gait velocity interpretation: <1.31 ft/sec, indicative of household ambulator General Gait Details: VCs for step initiation and quad setting during loading response. Moderate assist for stability with LUE supported   Stairs             Wheelchair Mobility    Modified Rankin  (Stroke Patients Only) Modified Rankin (Stroke Patients Only) Pre-Morbid Rankin Score: No symptoms Modified Rankin: Moderately severe disability     Balance Overall balance assessment: Needs assistance Sitting-balance support: No upper extremity supported;Feet supported Sitting balance-Leahy Scale: Fair Sitting balance - Comments: able to maintain with some dynamic movements at EOB   Standing balance support: Bilateral upper extremity supported;During functional activity Standing balance-Leahy Scale: Poor Standing balance comment: able to perform upright activity with LLE blocked out by stedy knee support. Assist to maintain position of LUE                            Cognition Arousal/Alertness: Awake/alert Behavior During Therapy: Impulsive;WFL for tasks assessed/performed Overall Cognitive Status: Impaired/Different from baseline                     Current Attention Level: Selective   Following Commands: Follows one step commands with increased time Safety/Judgement: Decreased awareness of deficits Awareness: Emergent Problem Solving: Requires verbal cues General Comments: Patient with improvements in attention and task performance      Exercises Other Exercises Other Exercises: Tolerated dynamic standing activities at counter with use of stedy for LE support and LLE knee block in weight bearing    General Comments        Pertinent Vitals/Pain Pain Assessment: No/denies pain    Home Living                      Prior Function            PT Goals (current goals can now be  found in the care plan section) Acute Rehab PT Goals Patient Stated Goal: to get better PT Goal Formulation: With patient Time For Goal Achievement: 08/12/17 Potential to Achieve Goals: Good Progress towards PT goals: Progressing toward goals    Frequency    Min 4X/week      PT Plan Current plan remains appropriate    Co-evaluation PT/OT/SLP  Co-Evaluation/Treatment: Yes Reason for Co-Treatment: Complexity of the patient's impairments (multi-system involvement) PT goals addressed during session: Mobility/safety with mobility OT goals addressed during session: ADL's and self-care      AM-PAC PT "6 Clicks" Daily Activity  Outcome Measure  Difficulty turning over in bed (including adjusting bedclothes, sheets and blankets)?: A Little Difficulty moving from lying on back to sitting on the side of the bed? : A Little Difficulty sitting down on and standing up from a chair with arms (e.g., wheelchair, bedside commode, etc,.)?: Unable Help needed moving to and from a bed to chair (including a wheelchair)?: A Lot Help needed walking in hospital room?: A Lot Help needed climbing 3-5 steps with a railing? : A Lot 6 Click Score: 13    End of Session Equipment Utilized During Treatment: Gait belt Activity Tolerance: Patient tolerated treatment well Patient left: in chair;with call bell/phone within reach;with chair alarm set Nurse Communication: Mobility status PT Visit Diagnosis: Unsteadiness on feet (R26.81);Hemiplegia and hemiparesis Hemiplegia - Right/Left: Left Hemiplegia - dominant/non-dominant: Non-dominant Hemiplegia - caused by: Cerebral infarction     Time: 0804-0829 PT Time Calculation (min) (ACUTE ONLY): 25 min  Charges:  $Therapeutic Activity: 8-22 mins                    G Codes:       Charlotte Crumbevon Mckinzee Spirito, PT DPT  Board Certified Neurologic Specialist [phone]    Fabio AsaDevon J Yaminah Clayborn 08/05/2017, 8:36 AM

## 2017-08-05 NOTE — Progress Notes (Signed)
Inpatient Rehabilitation  I have called and spoken with sister, Rosey Batheresa she is continuing to work on getting together the anticipated needs covered upon her brother's discharge and is in favor of an IP Rehab admission.  Additionally, she has questions about the medical plans for her brother and would like an MD to call her today to discuss his plan of care.  Notified stroke team.        I have called to check status of [employer] authorization request; however, note plans for re-evaluate for timing of CEA.  Plan to continue to follow for timing of medical readiness, insurance approval, and IP Rehab bed availability.  Call if questions.  Charlane FerrettiMelissa Adyn Hoes, M.A., CCC/SLP Admission Coordinator  [facility]  Cell [phone]

## 2017-08-05 NOTE — Progress Notes (Signed)
Ranelle Oyster, MD  Physician  Physical Medicine and Rehabilitation  Consult Note  Signed  Date of Service:  08/01/2017 9:14 AM       Related encounter: ED to Hosp-Admission (Discharged) from 07/27/2017 in [facility]      Signed      Expand All Collapse All       Show:Clear all [x] Manual[x] Template[] Copied  Added by: [x] Love, Evlyn Kanner, PA-C[x] Ranelle Oyster, MD   [] Hover for details        Physical Medicine and Rehabilitation Consult   Reason for Consult: Functional deficits due to stroke Referring Physician: Dr. Pearlean Brownie   HPI: [name] [name] is a 52 y.o. male with history of HTN, tobacco abuse, alcohol abuse, GERD who was admitted on 07/27/17 with left sided weakness that started early in am but patient refused to come to ED. UDS positive for cocaine. CTA/perfusion showed large MCA infarct with high grade near occlusive stenosis of R-ICA at bifurcation and occluded right M1 segment with poor collaterals. 2 D echo showed EF 55-60% with grade 2 diastolic dysfunction and no PFO seen. He had decline in MS after admission requiring transfer to ICU. Dr. Imogene Burn consulted for input and felt that immediate surgical intervention likely not helpful with concerns of hemorrhagic conversion. Follow up CT head showed mild mass effect and Dr. Lovell Sheehan recommended hypertonic saline for treatment. CT head today showed stable infarct with increase in mass effect and 7 mm right to left midline shift and effacement of right lateral ventricle. Mentation has improved and therapy evaluations done revealing left inattention, dense left sided weakness and difficulty with mobility. CIR recommended due to functional deficits.    Review of Systems  Constitutional: Negative for chills and fever.  HENT: Negative for hearing loss and tinnitus.   Eyes: Negative for blurred vision and double vision.  Respiratory: Positive for cough (ongoing for past few months. ) and  sputum production. Negative for shortness of breath.   Cardiovascular: Negative for chest pain and palpitations.  Gastrointestinal: Positive for heartburn. Negative for abdominal pain and constipation.  Genitourinary: Negative for dysuria and urgency.  Musculoskeletal: Negative for back pain, joint pain and myalgias.  Skin: Negative for rash.  Neurological: Positive for focal weakness. Negative for dizziness and headaches.  Psychiatric/Behavioral: The patient is not nervous/anxious and does not have insomnia.           Past Medical History:  Diagnosis Date  . GERD (gastroesophageal reflux disease)   . Hypertension   . Nephrolithiasis   . Tobacco abuse          Past Surgical History:  Procedure Laterality Date  . CYSTOSCOPY W/ URETERAL STENT PLACEMENT Bilateral 09/07/2013   Procedure: CYSTOSCOPY WITH RETROGRADE PYELOGRAM/URETERAL STENT PLACEMENT;  Surgeon: Sebastian Ache, MD;  Location: [facility];  Service: Urology;  Laterality: Bilateral;  . CYSTOSCOPY WITH RETROGRADE PYELOGRAM, URETEROSCOPY AND STENT PLACEMENT Bilateral 09/09/2013   Procedure: CYSTOSCOPY WITH RETROGRADE PYELOGRAM, DIAGNOSTIC URETEROSCOPY AND STENT CHANGE;  Surgeon: Sebastian Ache, MD;  Location: [facility];  Service: Urology;  Laterality: Bilateral;  . HOLMIUM LASER APPLICATION Right 09/07/2013   Procedure: HOLMIUM LASER APPLICATION;  Surgeon: Sebastian Ache, MD;  Location: [facility];  Service: Urology;  Laterality: Right;  . HOLMIUM LASER APPLICATION Right 09/09/2013   Procedure: HOLMIUM LASER APPLICATION;  Surgeon: Sebastian Ache, MD;  Location: [facility];  Service: Urology;  Laterality: Right;  . NEPHROLITHOTOMY Right 09/07/2013   Procedure: FIRST STAGE RIGHT PERCUTANEOUS NEPHROLITHOTOMY  WITH ACCESS, ;  Surgeon: Sebastian Acheheodore [name], MD;  Location: [facility];  Service: Urology;  Laterality: Right;  . NEPHROLITHOTOMY Right 09/09/2013   Procedure: RIGHT 2ND STAGE NEPHROLITHOTOMY PERCUTANEOUS;  Surgeon: Sebastian Acheheodore [name], MD;  Location:  [facility];  Service: Urology;  Laterality: Right;  . UMBILICAL HERNIA REPAIR           Family History  Problem Relation Age of Onset  . Prostate cancer Father [id number]  . Lung cancer Father   . Nephrolithiasis Father   . Ovarian cancer Mother   . CVA Paternal Grandmother        in her 4350's  . Cancer Paternal Grandmother        eye  . Diabetes Maternal Grandmother   . Alcoholism Maternal Uncle        x 3    Social History:  Lives alone and independent [place]TA[place] in [place]. Has a sister in town who works. He reports that he has been smoking cigarettes.  He has a 25.00 pack-year smoking history. He has never used smokeless tobacco. He reports that he drinks alcohol --a pint of whiskey daily. He reports that he has current or past drug history. Drug: Marijuana.    Allergies: No Known Allergies    Medications Prior to Admission  Medication Sig Dispense Refill  . lisinopril-hydrochlorothiazide (PRINZIDE,ZESTORETIC) 20-25 MG tablet Take 1 tablet by mouth daily.    . hydrocortisone (ANUSOL-HC) 25 MG suppository Place 1 suppository (25 mg total) rectally at bedtime. For 5 nights then as needed for hemorrhoids (Patient not taking: Reported on 07/28/2017) 24 suppository 0  . HYDROmorphone (DILAUDID) 2 MG tablet Take 1 tablet (2 mg total) by mouth every 4 (four) hours as needed for moderate pain or severe pain. Post-operatively (Patient not taking: Reported on 07/28/2017) 30 tablet 0  . senna-docusate (SENOKOT-S) 8.6-50 MG per tablet Take 1 tablet by mouth 2 (two) times daily. 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 sister providing details of home setup .  has two sisters and  joan brown is a neighbor that helps take care of his mobile home when he is away for work  Lives With: Alone  Functional History: Prior Function Level of Independence: Independent Functional Status:  Mobility: Bed Mobility Overal bed mobility: Needs Assistance Bed Mobility: Supine to Sit Supine to sit: Min assist Sit to supine: Mod assist General bed mobility comments: pt requires cues to initiate but able to progress to eob with min (A) to help with L trunk rotation only Transfers Overall transfer level: Needs assistance Transfers: Sit to/from Stand, Stand Pivot Transfers Sit to Stand: Mod assist, +2 physical assistance, Min assist Stand pivot transfers: Min assist, Mod assist, +2 physical assistance  Lateral/Scoot Transfers: Min assist, +2 safety/equipment General transfer comment: L knee blocked to prevent knee buckling, max directional v/c's to sequence std pvt transfer, pt able to power up well however with strong lean to the R Ambulation/Gait Ambulation/Gait assistance: Mod assist, Max assist, +2 physical assistance(person for chair follow and IV  pole) Ambulation Distance (Feet): 10 Feet Assistive device: (2 person 3 musketeer style with arms crossed in the back) Gait Pattern/deviations: Step-through pattern General Gait Details: pt with impaired L LE proprioception, adduction with stepping and further abducts R foot to widen base of support due to feeling of falling down despite manual cues at hips and LEs for optimal gait mechanics/kinematics Gait velocity: slow Gait velocity interpretation: <1.8 ft/sec, indicate of risk for recurrent falls  ADL: ADL Overall ADL's : Needs  assistance/impaired Eating/Feeding: Set up Eating/Feeding Details (indicate cue type and reason): lunch arriving during session. Grooming: Set up Upper Body Bathing: Moderate assistance Lower Body Bathing: Maximal assistance Lower Body Dressing: Moderate assistance(sitting EOB to don socks) Lower Body Dressing Details (indicate cue type and reason): pt able to place R sock on Le with (A) to hook big toe. pt requires total (A) for L LE Toilet Transfer: +2 for physical assistance, Minimal assistance Toilet Transfer Details (indicate cue type and reason): pt able to progress bil LE with ataxic L LE movement. pt scissoring Functional mobility during ADLs: +2 for physical assistance, Minimal assistance General ADL Comments: pt with scissoring and narrowed gait with transfer. pt needed max cues for sequence and L LE placement/ safety. pt needs faciliation of R weight shift to advance LLE  Cognition: Cognition Overall Cognitive Status: Impaired/Different from baseline Arousal/Alertness: Awake/alert Orientation Level: Oriented X4 Attention: Selective, Sustained Sustained Attention: Impaired Sustained Attention Impairment: Verbal complex, Functional complex Selective Attention: Impaired Selective Attention Impairment: Verbal complex, Functional complex Memory: Impaired Memory Impairment: Retrieval deficit, Decreased recall of new information, Decreased short term memory Decreased Short Term Memory: Verbal basic, Functional basic Awareness: Impaired Awareness Impairment: Emergent impairment, Anticipatory impairment, Intellectual impairment Problem Solving: Impaired Problem Solving Impairment: Verbal basic, Functional basic Executive Function: Reasoning, Organizing, Decision Making, Initiating, Self Monitoring, Self Correcting Reasoning: Impaired Reasoning Impairment: Verbal basic, Functional basic Organizing: Impaired Organizing Impairment: Verbal basic, Functional basic Decision Making:  Impaired Decision Making Impairment: Verbal basic, Functional basic Initiating: Impaired Initiating Impairment: Verbal basic, Functional basic Self Monitoring: Impaired Self Monitoring Impairment: Verbal basic, Functional basic Self Correcting: Impaired Self Correcting Impairment: Verbal basic, Functional basic Behaviors: Restless, Impulsive, Poor frustration tolerance Safety/Judgment: Impaired Cognition Arousal/Alertness: Awake/alert Behavior During Therapy: Impulsive Overall Cognitive Status: Impaired/Different from baseline Area of Impairment: Awareness, Safety/judgement, Problem solving, Following commands Current Attention Level: Sustained Following Commands: Follows one step commands with increased time, Follows multi-step commands inconsistently Safety/Judgement: Decreased awareness of deficits(L sided neglect) Awareness: Emergent Problem Solving: Slow processing, Difficulty sequencing, Requires verbal cues, Requires tactile cues General Comments: pt scanning to L with name call this session which is improved from previous sessions. pt able to verbalize "L side dont work" when asked to describe deficits this session. pt shows social awareness for appropriate behavior during session with females. pt expressed having a headache and eager to get oob today   Blood pressure (!) 153/91, pulse 68, temperature 97.8 F (36.6 C), temperature source Oral, resp. rate (!) 21, height 5\' 5"  (1.651 m), weight 76.3 kg (168 lb 3.2 oz), SpO2 95 %. Physical Exam  Nursing note and vitals reviewed. Constitutional: He appears well-developed.  HENT:  Head: Normocephalic.  Eyes: Pupils are equal, round, and reactive to light.  Neck: Normal range of motion.  Cardiovascular: Normal rate.  Respiratory: Effort normal.  GI: Soft.  Neurological:  Mild left facial weakness without dysarthria or pocketing. Left inattention but able to scan to the left with minimal cues. Able to move eyes to left field  on  testing.Left hemiparesis.  LUE tr to 0/5. LLE 1-2/5 prox to distal with decreased engagement on left in general. Does sense pain and gross touch in left arm and leg.  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 Does the need for close, 24 hr/day medical supervision in concert with the patient's rehab needs make it unreasonable for this patient to be served in a less intensive setting? Yes 2. Co-Morbidities requiring supervision/potential complications: htn, post-stroke sequelae 3. Due to bladder management, bowel management, safety, skin/wound care, disease management, medication administration and patient education, does the patient require 24 hr/day rehab nursing? Yes 4. Does the patient require coordinated care of a physician, rehab nurse, PT (1-2 hrs/day, 5 days/week), OT (1-2 hrs/day, 5 days/week) and SLP (1-2 hrs/day, 5 days/week) to address physical and functional deficits in the context of the above medical diagnosis(es)? Yes Addressing deficits in the following areas: balance, endurance, locomotion, strength, transferring, bowel/bladder control, bathing, dressing, feeding, grooming, toileting, cognition, speech, swallowing and psychosocial support 5. Can the patient actively participate in an intensive therapy program of at least 3 hrs of therapy per day at least 5 days per week? Yes 6. The potential for patient to make measurable gains while on inpatient rehab is excellent 7. Anticipated functional outcomes upon discharge from inpatient rehab are modified independent and supervision  with PT, modified independent, supervision and min assist with OT, modified independent with SLP. 8. Estimated rehab length of stay to reach the above functional goals is: 18-24 days 9. Anticipated D/C setting: Home 10. Anticipated post D/C treatments: HH therapy and Outpatient therapy 11. Overall Rehab/Functional Prognosis:  excellent  RECOMMENDATIONS: This patient's condition is appropriate for continued rehabilitative care in the following setting: CIR Patient has agreed to participate in recommended program. Yes Note that insurance prior authorization may be required for reimbursement for recommended care.  Comment: Rehab Admissions Coordinator to follow up.  Thanks,  Ranelle Oyster, MD, [place] [facility]  I have personally performed a face to face diagnostic evaluation of this patient. Additionally, I have reviewed and concur with the physician assistant's documentation above.    Jacquelynn Cree, PA-C 08/01/2017          Revision History                        Routing History

## 2017-08-05 NOTE — Progress Notes (Addendum)
Inpatient Rehabilitation  [facility]; however, await medical readiness.  If ready today for admission, then please notify my co-worker Genie at [phone] so she can process admission. If not then will have to close case at end to day and start over.  Charlane FerrettiMelissa Auriah Hollings, M.A., CCC/SLP Admission Coordinator  [facility]  Cell [phone]

## 2017-08-05 NOTE — Progress Notes (Signed)
RT instructed pt on use of flutter valve and pt able to preform on his own.

## 2017-08-05 NOTE — Progress Notes (Signed)
Patient is discharged from room [id number] and transferred to unit 4W18 at this time. Alert and in stable condition. Report given to receiving nurse Stanton Kidneyebra RN, with all questions answered. Transported via wheelchair with all belongings at side.

## 2017-08-05 NOTE — Discharge Summary (Addendum)
Stroke Discharge Summary  Patient ID: [name] [name]   MRN: [id number]      DOB: 1965-09-06  Date of Admission: 07/27/2017 Date of Discharge: 08/05/2017  Attending Physician:  Marvel Plan, MD, Stroke MD Consultant(s):  Tressie Stalker, MD (neurosurgery), Dr. 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 GERD (gastroesophageal reflux disease)   . Hypertension   . Nephrolithiasis   . Tobacco abuse    Past Surgical History:  Procedure Laterality Date  . CYSTOSCOPY W/ URETERAL STENT PLACEMENT Bilateral 09/07/2013   Procedure: CYSTOSCOPY WITH RETROGRADE PYELOGRAM/URETERAL STENT PLACEMENT;  Surgeon: Sebastian Ache, MD;  Location: [facility];  Service: Urology;  Laterality: Bilateral;  . CYSTOSCOPY WITH RETROGRADE PYELOGRAM, URETEROSCOPY AND STENT PLACEMENT Bilateral 09/09/2013   Procedure: CYSTOSCOPY WITH RETROGRADE PYELOGRAM, DIAGNOSTIC URETEROSCOPY AND STENT CHANGE;  Surgeon: Sebastian Ache, MD;  Location: [facility];  Service: Urology;  Laterality: Bilateral;  . HOLMIUM LASER APPLICATION Right 09/07/2013   Procedure: HOLMIUM LASER APPLICATION;  Surgeon: Sebastian Ache, MD;  Location: [facility];  Service: Urology;  Laterality: Right;  . HOLMIUM LASER APPLICATION Right 09/09/2013   Procedure: HOLMIUM LASER APPLICATION;  Surgeon: Sebastian Ache, MD;  Location: [facility];  Service: Urology;  Laterality: Right;  . NEPHROLITHOTOMY Right 09/07/2013   Procedure: FIRST STAGE RIGHT PERCUTANEOUS NEPHROLITHOTOMY  WITH ACCESS, ;  Surgeon: Sebastian Ache, MD;   Location: [facility];  Service: Urology;  Laterality: Right;  . NEPHROLITHOTOMY Right 09/09/2013   Procedure: RIGHT 2ND STAGE NEPHROLITHOTOMY PERCUTANEOUS;  Surgeon: Sebastian Ache, MD;  Location: [facility];  Service: Urology;  Laterality: Right;  . UMBILICAL HERNIA REPAIR      Medications to be continued on Rehab Allergies as of 08/05/2017   No Known Allergies     Medication List    STOP taking these medications   hydrocortisone 25 MG suppository Commonly known as:  ANUSOL-HC   HYDROmorphone 2 MG tablet Commonly known as:  DILAUDID   lisinopril-hydrochlorothiazide 20-25 MG tablet Commonly known as:  PRINZIDE,ZESTORETIC     TAKE these medications   atorvastatin 40 MG tablet Commonly known as:  LIPITOR Take 1 tablet (40 mg total) by mouth daily at 6 PM.   enoxaparin 40 MG/0.4ML injection Commonly known as:  LOVENOX Inject 0.4 mLs (40 mg total) into the skin daily. Start taking on:  08/06/2017   folic acid 1 MG tablet Commonly known as:  FOLVITE Take 1 tablet (1 mg total) by mouth daily. Start taking on:  08/06/2017   multivitamin with minerals Tabs tablet Take 1 tablet by mouth daily. Start taking on:  08/06/2017   senna 8.6 MG Tabs tablet Commonly known as:  SENOKOT Take 1 tablet (8.6 mg total) by mouth at bedtime.   senna-docusate 8.6-50 MG tablet Commonly known as:  Senokot-S Take 1 tablet by mouth at bedtime as needed for mild constipation or moderate constipation. What changed:    when to take this  reasons to take this  additional instructions       LABORATORY STUDIES CBC  Component Value Date/Time   WBC 10.3 08/05/2017 0620   RBC 5.35 08/05/2017 0620   HGB 18.1 (H) 08/05/2017 0620   HCT 52.0 08/05/2017 0620   PLT 271 08/05/2017 0620   MCV 97.2 08/05/2017 0620   MCH 33.8 08/05/2017 0620   MCHC 34.8 08/05/2017 0620   RDW 11.5 08/05/2017 0620   LYMPHSABS 2.5 07/27/2017 2025   MONOABS 1.5 (H) 07/27/2017 2025   EOSABS 0.0 07/27/2017 2025   BASOSABS 0.1  07/27/2017 2025   CMP    Component Value Date/Time   NA 140 08/05/2017 0620   K 3.6 08/05/2017 0620   CL 106 08/05/2017 0620   CO2 25 08/05/2017 0620   GLUCOSE 101 (H) 08/05/2017 0620   BUN 18 08/05/2017 0620   CREATININE 0.94 08/05/2017 0620   CALCIUM 9.1 08/05/2017 0620   PROT 6.1 (L) 07/29/2017 0611   ALBUMIN 3.3 (L) 07/29/2017 0611   AST 34 07/29/2017 0611   ALT 33 07/29/2017 0611   ALKPHOS 52 07/29/2017 0611   BILITOT 1.0 07/29/2017 0611   GFRNONAA >60 08/05/2017 0620   GFRAA >60 08/05/2017 0620   COAGS Lab Results  Component Value Date   INR 1.05 07/27/2017   Lipid Panel    Component Value Date/Time   CHOL 172 07/28/2017 0848   TRIG 99 07/28/2017 0848   HDL 46 07/28/2017 0848   CHOLHDL 3.7 07/28/2017 0848   VLDL 20 07/28/2017 0848   LDLCALC 106 (H) 07/28/2017 0848   HgbA1C  Lab Results  Component Value Date   HGBA1C 5.0 07/28/2017   Urinalysis    Component Value Date/Time   COLORURINE YELLOW 07/28/2017 0243   APPEARANCEUR CLEAR 07/28/2017 0243   LABSPEC >1.046 (H) 07/28/2017 0243   PHURINE 7.0 07/28/2017 0243   GLUCOSEU NEGATIVE 07/28/2017 0243   HGBUR NEGATIVE 07/28/2017 0243   BILIRUBINUR NEGATIVE 07/28/2017 0243   KETONESUR NEGATIVE 07/28/2017 0243   PROTEINUR NEGATIVE 07/28/2017 0243   NITRITE NEGATIVE 07/28/2017 0243   LEUKOCYTESUR NEGATIVE 07/28/2017 0243   Urine Drug Screen     Component Value Date/Time   LABOPIA NONE DETECTED 07/28/2017 1700   COCAINSCRNUR POSITIVE (A) 07/28/2017 1700   LABBENZ NONE DETECTED 07/28/2017 1700   AMPHETMU NONE DETECTED 07/28/2017 1700   THCU NONE DETECTED 07/28/2017 1700   LABBARB NONE DETECTED 07/28/2017 1700    Alcohol Level No results found for: ETH   SIGNIFICANT DIAGNOSTIC STUDIES Ct Head Code Stroke Wo Contrast 07/27/2017 IMPRESSION:  1. Large right MCA territory infarct involving the insular cortex, lentiform nucleus, anterior limb internal capsule, caudate head, and right temporal lobe. This  results in some mass effect with effacement of the sulci and right lateral ventricle but no hemorrhage.  2. Hyperdense right MCA extending beyond the bifurcation compatible with thrombus.  3. Mild diffuse subcortical white matter hypoattenuation bilaterally likely reflects microvascular ischemia.  4. ASPECTS is 4/10   Ct Angio Head W Or Wo Contrast Ct Angio Neck W Or Wo Contrast Ct Cerebral Perfusion W Contrast 07/27/2017 IMPRESSION:  1. Large right MCA territory infarct.  CBF< 30% is 69 mL.  2. Hypoperfusion index of 0.6 consistent with very poor collaterals and rapidly progressive infarct.  3. 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  4. Marked decreased caliber of the cervical right internal carotid artery distal to the stenosis.  5. Reconstitution of the right internal carotid artery at the ophthalmic segment with some filling of the fetal type right posterior  cerebral artery.  6. Occluded right M1 segment with poor collaterals.  7. Atherosclerotic changes at the left carotid bifurcation without significant stenosis.  8. Left MCA and ACA vessels are within normal limits.  9. Small basilar artery feeds the left posterior cerebral artery.   CT Head Wo Contrast  08/01/2017 IMPRESSION: 1. Large right MCA infarction is stable in distribution but increased in edema/mass effect. 7 mm of right-to-left midline shift, previously 4 mm. 2. No new acute stroke, hemorrhage, or focal mass effect.  MRI Brain Wo Contrast  08/02/2017 IMPRESSION: 1. Stable distribution of large right MCA early subacute infarction given differences in technique. Petechial hemorrhage within the right basal ganglia. Stable to minimally decreased mass effect with 6 mm right-to-left midline shift. 2. No new acute intracranial abnormality identified.  Transthoracic Echocardiogram  Left ventricle: The cavity size was normal. There was mild focalbasal  hypertrophy of the septum. Systolic function was normal.The estimated ejection fraction was in the range of 55% to 60%.Wall motion was normal; there were no regional wall motion abnormality     HISTORY OF PRESENT ILLNESS [name] [name] is a 52 y.o. male who woke up and noted left sided hemiplegia acutely at 7 am. On 07/27/2017. He was found at 8:30 am.  Refused to come to the ER until 8:30 pm.  CT Brain no blood, but hypodensity right MCA with ASPECTS 3-4. 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 RAPID shows core infarct 69 ml and penumbra 133 ml with ratio 1.9.  NIHSS 13.  He has a history of hypertension but not fully compliant.  No antiplatelet therapy at home. Likely artery to artery atheroembolism from right ICA stenosis. Outside the window for IV tPA. Due to lowe ASPECTS score, too high risk for interventional neuroradiology. He was admitted to the neuro ICU for further evaluation and treatment.   HOSPITAL COURSE [name] [name] is a 52 y.o. male with history of tobacco use, hypertension,and medical noncompliance presenting with left hemiplegia. He did not receive IV t-PA due to unknown time of onset.  Neurologically worsened following admission.  Neurosurgery consulted and not felt to be a good surgical candidate.  Treated with hypertonic saline in the neuro ICU.  Given large stroke, we were concerned about pulmonary issues however none developed.  Vascular surgery was consulted to address right ICA stenosis.  Plans are for 4-week follow-up and consider surgery at that time.  He was transferred to the floor, then to rehab for ongoing therapy needs.  Large right MCA stroke - Artery to artery A embolic stroke from proximal high grade R carotid stenosis  Resultant  Left hemiparesis arm >> leg, facial droop, dysarthria  CT head - Large right MCA territory infarct involving the insular cortex, lentiform nucleus, anterior limb internal capsule, caudate head, and right temporal  lobe  MRI head - Stable distribution of large right MCA. 6 mm right-to-left midline shift.  CTA H&N - High-grade, critical, stenosis Rt ICA.  Rt MCA compatible with thrombus.   2D Echo -normal EF  LDL - 106  HgbA1c - 5.0  No antithrombotic prior to admission, now on aspirin 325 mg daily.  Can you aspirin at discharge  Patient counseled to be compliant with his antithrombotic medications  Ongoing aggressive stroke risk factor management  Therapy recommendations:  CIR  Disposition:  CIR  Right carotid stenosis  CTA H&N - High-grade, critical, stenosis Rt ICA.  Likely the cause of current stroke  VVS consulted initially but  recommend no surgery due to poor neuro status  Currently pt neuro stable and improved.  Discussed case with Dr Imogene Burnhen.  He feels no benefit of surgery at this time.  Okay to discharge to rehab.  He will follow-up in 4 weeks and determine if surgical intervention indicated at that time.  Cerebral edema, induced hypernatremia  Treated with hypertonic saline during ICU stay  Na now 145  MRI 08/02/17 showed stable right MCA large infarct with 6mm midline shift  Hypertension  Stable   BP goal at this time is 120 to 160 due to severe carotid stenosis with large stroke  Will not resume home blood pressure medicine at this time  Hyperlipidemia  Lipid lowering medication PTA:  none  LDL 106, goal < 70  Current lipid lowering medication: now on Lipitor 40 mg daily  Continue statin at discharge  Cocaine abuse  UDS positive for cocaine  Cessation education provided  Pt is willing to quit  Tobacco abuse  Current smoker  Smoking cessation counseling provided  Pt is willing to quit  Other Stroke Risk Factors  ETOH use, advised to drink no more than 1 alcoholic beverage per day.  Family hx stroke (grandmother had stroke in her 5250s)  Other Active Problems  Leukocytosis - improving - 11.7 -> 10.3   DISCHARGE EXAM Blood  pressure (!) 128/97, pulse 83, temperature 98.4 F (36.9 C), temperature source Oral, resp. rate 20, height 5\' 5"  (1.651 m), weight 76.3 kg (168 lb 3.2 oz), SpO2 95 %. General - Well nourished, well developed, in no apparent distress. Cardiovascular - Regular rate and rhythm.  Mental Status -  Lethargic.  Upon awakening, level of arousal and orientation to time, place, and person were intact. Language including expression, naming, repetition, comprehension was assessed and found intact. Fund of Knowledge was assessed and was intact.  Cranial Nerves II - XII - II - Visual field intact OU. III, IV, VI - Extraocular movements intact. V - Facial sensation intact bilaterally. VII - left mild facial droop. VIII - Hearing & vestibular intact bilaterally. X - Palate elevates symmetrically. XI - Chin turning & shoulder shrug intact bilaterally XII - Tongue protrusion intact.  Motor Strength - The patient's strength was normal in RUE and RLE, however, left UE 2/5 bicep otherwise 1/5, LLE 3/5 proximal and distal.  Bulk was normal and fasciculations were absent.   Motor Tone - Muscle tone was assessed at the neck and appendages and was normal.  Sensory - Light touch, temperature/pinprick were assessed and were symmetrical.    Coordination - The patient had normal movements in the right hands with no ataxia or dysmetria.  Tremor was absent.  Gait and Station - deferred.   Discharge Diet heart healthy thin liquids  DISCHARGE PLAN  Disposition:  [facility], OT and ST  aspirin 325 mg daily for secondary stroke prevention.  Recommend ongoing risk factor control by Primary Care Physician at time of discharge from inpatient rehabilitation.  Follow-up Alysia PennaHolwerda, Scott, MD in 2 weeks following discharge from rehab.  Follow-up in [facility] in 4 weeks following discharge from rehab, office to schedule an  appointment.   Follow-up Dr. Leonides SakeBrian Chen, vascular surgeon in 4 weeks  50 minutes were spent preparing discharge.  Annie MainSharon Anshul Meddings, MSN, APRN, ANVP-BC, AGPCNP-BC Advanced Practice Stroke Nurse [facility] See Amion for Schedule & Pager information 08/05/2017 2:19 PM   ATTENDING NOTE: I reviewed above  note and agree with the assessment and plan. I have made any additions or clarifications directly to the above note.    No acute event overnight. Still has left hemiparesis. Given right subtotal occlusion of right ICA, VVS Dr. Imogene Burn was contacted again for possible CEA or stenting.  He feels no benefit of surgery at this time although guideline recommend CEA within 2 weeks of index stroke.  He will follow-up this pt in 4 weeks and determine if surgical intervention indicated at that time. Pt is ready for CIR discharge. He will follow up with Dr. Pearlean Brownie at [facility] in 4-6 weeks.   Marvel Plan, MD PhD Stroke Neurology 08/05/2017 4:17 PM

## 2017-08-05 NOTE — IPOC Note (Signed)
Overall Plan of Care [facility]) Patient Details Name: [name] [name] MRN: [id number] DOB: 1965-12-27  Admitting Diagnosis: Right MCA stroke  Hospital Problems: Active Problems:   Acute ischemic right MCA stroke (HCC)   Leukocytosis   Internal carotid artery stenosis, right     Functional Problem List: Nursing Endurance, Sensory, Safety, Medication Management  PT Balance, Endurance, Motor, Perception, Safety, Behavior, Sensory  OT Balance, Cognition, Motor, Endurance, Sensory, Safety, Perception, Vision  SLP Cognition, Linguistic  TR         Basic ADL's: OT Bathing, Grooming, Dressing, Toileting     Advanced  ADL's: OT Simple Meal Preparation     Transfers: PT Bed Mobility, Bed to Chair, Car, Lobbyist, Technical brewer: PT Ambulation, Psychologist, prison and probation services, Stairs     Additional Impairments: OT Fuctional Use of Upper Extremity  SLP Communication, Social Cognition expression Problem Solving, Memory, Attention, Awareness  TR      Anticipated Outcomes Item Anticipated Outcome  Self Feeding modified independent  Swallowing      Basic self-care  supervision  Toileting  supervision   Bathroom Transfers supervision  Bowel/Bladder  manage bowel with mod I assist and bladder with min asssit  Transfers  S  Locomotion  S with LRAD  Communication  mod I   Cognition  Supervision   Pain     Safety/Judgment  maintain safety with cues/supervision   Therapy Plan: PT Intensity: Minimum of 1-2 x/day ,45 to 90 minutes PT Frequency: 5 out of 7 days PT Duration Estimated Length of Stay: 14-16 days OT Intensity: Minimum of 1-2 x/day, 45 to 90 minutes OT Frequency: 5 out of 7 days OT Duration/Estimated Length of Stay: 14-16 days SLP Intensity: Minumum of 1-2 x/day, 30 to 90 minutes SLP Frequency: 3 to 5 out of 7 days SLP Duration/Estimated Length of Stay: 10-14 days     Team Interventions: Nursing Interventions Patient/Family Education, Disease  Management/Prevention, Discharge Planning, Bowel Management, Medication Management  PT interventions Discharge planning, Ambulation/gait training, Functional mobility training, Psychosocial support, Therapeutic Activities, Wheelchair propulsion/positioning, Therapeutic Exercise, Visual/perceptual remediation/compensation, Neuromuscular re-education, Disease management/prevention, Warden/ranger, Cognitive remediation/compensation, DME/adaptive equipment instruction, Pain management, Splinting/orthotics, UE/LE Strength taining/ROM, UE/LE Coordination activities, Stair training, Patient/family education, Community reintegration  OT Interventions Warden/ranger, Cognitive remediation/compensation, Firefighter, Fish farm manager, Disease mangement/prevention, Discharge planning, Functional electrical stimulation, Pain management, Self Care/advanced ADL retraining, Therapeutic Activities, UE/LE Coordination activities, Visual/perceptual remediation/compensation, Therapeutic Exercise, Patient/family education, Functional mobility training, Neuromuscular re-education, Psychosocial support, Splinting/orthotics, UE/LE Strength taining/ROM, Wheelchair propulsion/positioning  SLP Interventions Cognitive remediation/compensation, Financial trader, Environmental controls, Internal/external aids, Functional tasks, Patient/family education  TR Interventions    SW/CM Interventions Discharge Planning, Psychosocial Support, Patient/Family Education   Barriers to Discharge MD  Medical stability  Nursing      PT Home environment access/layout, Decreased caregiver support    OT Decreased caregiver support lives alone and sister works during the day  SLP Decreased caregiver support    SW Decreased caregiver support Does not have 24 hr care   Team Discharge Planning: Destination: PT-  ,OT- Home , SLP-Home(versus SNF ) Projected Follow-up: PT-Home health PT, 24  hour supervision/assistance, OT-  Outpatient OT, SLP-Home Health SLP, Outpatient SLP, Skilled Nursing facility, 24 hour supervision/assistance Projected Equipment Needs: PT-To be determined, OT- To be determined, SLP-None recommended by SLP Equipment Details: PT- , OT-  Patient/family involved in discharge planning: PT- Patient,  OT-Patient, SLP-Patient  MD ELOS: 12-16 days. Medical Rehab Prognosis:  Good Assessment: 52 year old  right-handed male with history of hypertension, tobacco abuse, alcohol abuse, GERD who was admitted on 07/27/2017 with left-sided weakness that started early in a.m.  Patient refused to come to the ED initially.  UDS was positive for cocaine.  CTA head neck/perfusion showed large MCA infarct with high-grade near occlusive stenosis of right-ICA at the bifurcation and occluded right M1 segment with poor collaterals.  R-MCA compatible thrombus. 2D echo done showed showing EF of 55 to 60% with grade 2 diastolic dysfunction and no PFO.  He had declining mental status after admission requiring transfer to ICU.  Dr. Imogene Burnhen was consulted for input and felt that immediate surgical intervention likely not helpful with concerns of hemorrhagic conversion.  Follow-up CT head showed mild mass-effect and Dr. Lovell SheehanJenkins recommended treatment with hypertonic saline.  Repeat CT 5/30 showed stable infarct however increase in mass-effect and 7 mm right to left midline shift with effacement.  Lethargy resolving and he has been monitored conservatively.  He continues to be limited by left inattention and left sided weakness affecting mobility as well as ability to carry out ADL tasks. Will set goals for Supervision for most tasks with PT/OT/SLP.   See Team Conference Notes for weekly updates to the plan of care

## 2017-08-06 ENCOUNTER — Inpatient Hospital Stay (HOSPITAL_COMMUNITY): Payer: Managed Care, Other (non HMO) | Admitting: Speech Pathology

## 2017-08-06 ENCOUNTER — Inpatient Hospital Stay (HOSPITAL_COMMUNITY): Payer: Managed Care, Other (non HMO) | Admitting: Occupational Therapy

## 2017-08-06 ENCOUNTER — Inpatient Hospital Stay (HOSPITAL_COMMUNITY): Payer: Managed Care, Other (non HMO) | Admitting: Physical Therapy

## 2017-08-06 DIAGNOSIS — I6521 Occlusion and stenosis of right carotid artery: Secondary | ICD-10-CM

## 2017-08-06 DIAGNOSIS — I10 Essential (primary) hypertension: Secondary | ICD-10-CM

## 2017-08-06 DIAGNOSIS — I63511 Cerebral infarction due to unspecified occlusion or stenosis of right middle cerebral artery: Secondary | ICD-10-CM

## 2017-08-06 DIAGNOSIS — D72829 Elevated white blood cell count, unspecified: Secondary | ICD-10-CM

## 2017-08-06 LAB — CBC WITH DIFFERENTIAL/PLATELET
Abs Immature Granulocytes: 0 10*3/uL (ref 0.0–0.1)
BASOS ABS: 0.1 10*3/uL (ref 0.0–0.1)
Basophils Relative: 1 %
EOS PCT: 2 %
Eosinophils Absolute: 0.2 10*3/uL (ref 0.0–0.7)
HEMATOCRIT: 52.5 % — AB (ref 39.0–52.0)
Hemoglobin: 18.3 g/dL — ABNORMAL HIGH (ref 13.0–17.0)
Immature Granulocytes: 0 %
Lymphocytes Relative: 29 %
Lymphs Abs: 3.1 10*3/uL (ref 0.7–4.0)
MCH: 33.9 pg (ref 26.0–34.0)
MCHC: 34.9 g/dL (ref 30.0–36.0)
MCV: 97.2 fL (ref 78.0–100.0)
MONO ABS: 1.3 10*3/uL — AB (ref 0.1–1.0)
MONOS PCT: 12 %
Neutro Abs: 5.9 10*3/uL (ref 1.7–7.7)
Neutrophils Relative %: 56 %
Platelets: 254 10*3/uL (ref 150–400)
RBC: 5.4 MIL/uL (ref 4.22–5.81)
RDW: 11.6 % (ref 11.5–15.5)
WBC: 10.6 10*3/uL — ABNORMAL HIGH (ref 4.0–10.5)

## 2017-08-06 LAB — COMPREHENSIVE METABOLIC PANEL
ALK PHOS: 64 U/L (ref 38–126)
ALT: 61 U/L (ref 17–63)
ANION GAP: 13 (ref 5–15)
AST: 38 U/L (ref 15–41)
Albumin: 3.8 g/dL (ref 3.5–5.0)
BILIRUBIN TOTAL: 0.9 mg/dL (ref 0.3–1.2)
BUN: 18 mg/dL (ref 6–20)
CALCIUM: 9.2 mg/dL (ref 8.9–10.3)
CO2: 23 mmol/L (ref 22–32)
Chloride: 106 mmol/L (ref 101–111)
Creatinine, Ser: 0.95 mg/dL (ref 0.61–1.24)
Glucose, Bld: 96 mg/dL (ref 65–99)
Potassium: 3.8 mmol/L (ref 3.5–5.1)
SODIUM: 142 mmol/L (ref 135–145)
Total Protein: 6.9 g/dL (ref 6.5–8.1)

## 2017-08-06 MED ORDER — FLUOXETINE HCL 10 MG PO CAPS
10.0000 mg | ORAL_CAPSULE | Freq: Every day | ORAL | Status: DC
  Administered 2017-08-06 – 2017-08-08 (×3): 10 mg via ORAL
  Filled 2017-08-06 (×3): qty 1

## 2017-08-06 NOTE — Progress Notes (Signed)
Social Work  Social Work Assessment and Plan  Patient Details  Name: [name] [name] MRN: [id number] Date of Birth: 10-Nov-1965  Today's Date: 08/06/2017  Problem List:  Patient Active Problem List   Diagnosis Date Noted  . Leukocytosis   . Internal carotid artery stenosis, right   . Cerebral edema (HCC) 08/05/2017  . Hyperlipidemia 08/05/2017  . Tobacco use disorder 08/05/2017  . Family history of stroke 08/05/2017  . Acute ischemic right MCA stroke (HCC) 08/05/2017  . Cerebral infarction due to embolism of right middle cerebral artery (HCC)   . Benign essential HTN   . Slow transit constipation   . Gastroesophageal reflux disease   . Carotid stenosis 08/04/2017  . Cocaine abuse (HCC) 08/04/2017  . Acute encephalopathy   . Essential hypertension   . 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 Staghorn calculus 08/24/2013  . Hemorrhoids, internal, with bleeding and Grade 2 prolapse 08/24/2013   Past Medical History:  Past Medical History:  Diagnosis Date  . GERD (gastroesophageal reflux disease)   . Hypertension   . Nephrolithiasis   . Tobacco abuse    Past Surgical History:  Past Surgical History:  Procedure Laterality Date  . CYSTOSCOPY W/ URETERAL STENT PLACEMENT Bilateral 09/07/2013   Procedure: CYSTOSCOPY WITH RETROGRADE PYELOGRAM/URETERAL STENT PLACEMENT;  Surgeon: Sebastian Ache, MD;  Location: [facility];  Service: Urology;  Laterality: Bilateral;  . CYSTOSCOPY WITH RETROGRADE PYELOGRAM, URETEROSCOPY AND STENT PLACEMENT Bilateral 09/09/2013   Procedure: CYSTOSCOPY WITH RETROGRADE PYELOGRAM, DIAGNOSTIC URETEROSCOPY AND STENT CHANGE;  Surgeon: Sebastian Ache, MD;  [facility]: [facility];  Service: Urology;  Laterality: Bilateral;  . HOLMIUM LASER APPLICATION Right 09/07/2013   Procedure: HOLMIUM LASER APPLICATION;  Surgeon: Sebastian Ache, MD;  [facility]: [facility];  Service: Urology;  Laterality: Right;  . HOLMIUM LASER APPLICATION Right 09/09/2013   Procedure: HOLMIUM LASER APPLICATION;  Surgeon: Sebastian Ache, MD;  [facility];  Service: Urology;  Laterality: Right;  . NEPHROLITHOTOMY Right 09/07/2013   Procedure: FIRST STAGE RIGHT PERCUTANEOUS NEPHROLITHOTOMY  WITH ACCESS, ;  Surgeon: Sebastian Ache, MD;  Location[facility];  Service: Urology;  Laterality: Right;  . NEPHROLITHOTOMY Right 09/09/2013   Procedure: RIGHT 2ND STAGE NEPHROLITHOTOMY PERCUTANEOUS;  Surgeon: Sebastian Ache, MD;  [facility];  Service: Urology;  Laterality: Right;  . UMBILICAL HERNIA REPAIR     Social History:  reports that he has been smoking cigarettes.  He has a 25.00 pack-year smoking history. He has never used smokeless tobacco. He reports that he drinks alcohol. He reports that he has current or past drug history. Drug: Marijuana.  Family / Support Systems Marital Status: Single Patient Roles: Other (Comment)(employee, sibling and nephew) Other Supports: Barbette Reichmann [phone]  Dianne Burrows-Aunt [phone]  [phone] Anticipated Caregiver: Unsure at this time Ability/Limitations of Caregiver: All work, except Teaching laboratory technician depends if can provide the care pt needs Caregiver Availability: Other (Comment)(Depends upon level of care-at this time does not have 24 hr care) Family Dynamics: Close with his two sister's and aunt and uncle. He has freinds but all do work but can come by and check on him. He wants to be independent before going home. Question if a sister can take FMLA  Social History Preferred language: English Religion: Non-Denominational Cultural Background: No issues Education: High School Read: Yes Write: Yes Employment Status: Employed Name of Employer: Regulatory affairs officer Return to Work Plans: Unsure at this time depends upon his progress Fish farm manager Issues: No issues  Guardian/Conservator: None-according to MD pt is capable of making his own decisions while here   Abuse/Neglect Abuse/Neglect  Assessment Can Be Completed: Yes Physical Abuse: Denies Verbal Abuse: Denies Sexual Abuse: Denies Exploitation of patient/patient's resources: Denies Self-Neglect: Denies  Emotional Status Pt's affect, behavior adn adjustment status: Pt is exhausted from two therapies, he can't believe how tired he is. He is one who works long hours per day doing [employer] labor. He wants to get back to his independent level he is not one to ask others for assist and doesn't like it. 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 With his young age he would benefit from seeing neuro-psych while here. Will make referral for this. Substance Abuse History: Tobacco, THC, ETOH and cocaine he plans to quit all of them and is aware of the recommendations from MD. Will continue to discuss while here, the resources available to him if he feels they are needed.  Patient / Family Perceptions, Expectations & Goals Pt/Family understanding of illness & functional limitations: Pt can report he had a stroke and his deficits. His sister also has a basic understanding of his stroke, but would like to talk with a MD for her questions. Will ask PA to call her to answer her questions. Pt is still adjusting to the new unit. Premorbid pt/family roles/activities: Brother, nephew, employee, friend, etc Anticipated changes in roles/activities/participation: resume Pt/family expectations/goals: Pt states: " I want to get back to where I was, I don't like asking for help."  Sister states: " We hope he does well here and can go home with intermittent assist."  [employer] Community Agencies: None Premorbid Home Care/DME Agencies: None Transportation available at discharge: Family and friend Resource referrals recommended: Neuropsychology, Support group (specify)  Discharge Planning Living Arrangements:  Alone Support Systems: Other relatives, Friends/neighbors Type of Residence: Private residence Insurance Resources: Media plannerrivate Insurance (specify)(AETNA) Financial Resources: Employment Financial Screen Referred: Yes Living Expenses: Own Money Management: Patient Does the patient have any problems obtaining your medications?: No Home Management: Self Patient/Family Preliminary Plans: Unsure at this time due to will need to see his progress and what level he will be at. The family can not provide 24 hr care unless someone takes a FMLA or his aunt or uncle agree to it. At this point they are hopeful he will do well and get to the level to only need a little assist. Sw Barriers to Discharge: Decreased caregiver support Sw Barriers to Discharge Comments: Does not have 24 hr care Social Work Anticipated Follow Up Needs: HH/OP, SNF, Support Group  Clinical Impression Pleasant gentleman who is willing to work hard to achieve his goals, but still adjusting to the new unit. His siblings and aunt and uncle are involved but at a loss due to all work and aunt and uncle are elderly. Will await therapy evaluations and come up with the best plan for discharge. Made aware since came to rehab insurance may not pay for him to go to  [facility] if this is decided. Will also try to address pt's substance abuse issues while here.  [name], Balraj Brayfield G 08/06/2017, 1:45 PM

## 2017-08-06 NOTE — Progress Notes (Signed)
Physical Therapy Session Note  Patient Details  Name: [name] [name] MRN: [id number] Date of Birth: Apr 01, 1965  Today's Date: 08/06/2017 PT Individual Time: 1555-1620 PT Individual Time Calculation (min): 25 min   Skilled Therapeutic Interventions/Progress Updates:   Pt in supine and agreeable to therapy w/ encouragement, denies pain but reports feeling tired from therapies earlier today. Transferred to EOB w/ supervision and to w/c via stand pivot w/ mod assist. Performed NuStep 10 min @ level 3 for LE strengthening in reciprocal movement pattern. Increased time required to complete 10 min as pt would frequently stop w/o warning and required cues to attend to task. Utilized all 4 extremities w/ total manual assist to maintain LUE to handle. Returned to room and ended session in supine, call bell within reach and all needs met.   Therapy Documentation Precautions:  Restrictions Weight Bearing Restrictions: No  See Function Navigator for Current Functional Status.   Therapy/Group: Individual Therapy  Tyrisha Benninger K Arnette 08/06/2017, 4:21 PM

## 2017-08-06 NOTE — Care Management Note (Signed)
[facility]patient Rehabilitation Center Individual Statement of Services  Patient Name:  [name] [name]  Date:  08/06/2017  Welcome to the [facility]patient [facility].  Our goal is to provide you with an individualized program based on your diagnosis and situation, designed to meet your specific needs.  With this comprehensive rehabilitation program, you will be expected to participate in at least 3 hours of rehabilitation therapies Monday-Friday, with modified therapy programming on the weekends.  Your rehabilitation program will include the following services:  Physical Therapy (PT), Occupational Therapy (OT), Speech Therapy (ST), 24 hour per day rehabilitation nursing, Therapeutic Recreaction (TR), Neuropsychology, Case Management (Social Worker), Rehabilitation Medicine, Nutrition Services and Pharmacy Services  Weekly team conferences will be held on Wednesday to discuss your progress.  Your Social Worker will talk with you frequently to get your input and to update you on team discussions.  Team conferences with you and your family in attendance may also be held.  Expected length of stay: 14-16 days Overall anticipated outcome: supervision level  Depending on your progress and recovery, your program may change. Your Social Worker will coordinate services and will keep you informed of any changes. Your Social Worker's name and contact numbers are listed  below.  The following services may also be recommended but are not provided by the Inpatient [facility]:   Driving Evaluations  Home Health Rehabiltiation Services  Outpatient Rehabilitation Services  Vocational Rehabilitation   Arrangements will be made to provide these services after discharge if needed.  Arrangements include referral to agencies that provide these services.  Your insurance has been verified to be:  Community education officer[employer]etna Your primary doctor is:  Psychologist, sport and exercisecott Holwerda  Pertinent information will be shared with your doctor and your  insurance company.  Social Worker:  Dossie DerBecky Junius Faucett, SW [phone] or [phone]) [phone]  Information discussed with and copy given to patient by: [name], [name], 08/06/2017, 1:50 PM

## 2017-08-06 NOTE — Evaluation (Signed)
Speech Language Pathology Assessment and Plan  Patient Details  Name: [name] [name] MRN: [id number] Date of Birth: 1965/10/24  SLP Diagnosis: Cognitive Impairments;Dysarthria  Rehab Potential: Good ELOS: 10-14 days     Today's Date: 08/06/2017 SLP Individual Time: 1005-1100 SLP Individual Time Calculation (min): 55 min   Problem List:  Patient Active Problem List   Diagnosis Date Noted  . Leukocytosis   . Internal carotid artery stenosis, right   . Cerebral edema ([place]) 08/05/2017  . Hyperlipidemia 08/05/2017  . Tobacco use disorder 08/05/2017  . Family history of stroke 08/05/2017  . Acute ischemic right MCA stroke ([place]) 08/05/2017  . Cerebral infarction due to embolism of right middle cerebral artery ([place])   . Benign essential HTN   . Slow transit constipation   . Gastroesophageal reflux disease   . Carotid stenosis 08/04/2017  . Cocaine abuse ([place]) 08/04/2017  . Acute encephalopathy   . Essential hypertension   . 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 Staghorn calculus 08/24/2013  . Hemorrhoids, internal, with bleeding and Grade 2 prolapse 08/24/2013   Past Medical History:  Past Medical History:  Diagnosis Date  . GERD (gastroesophageal reflux disease)   . Hypertension   . Nephrolithiasis   . Tobacco abuse    Past Surgical History:  Past Surgical History:  Procedure Laterality Date  . CYSTOSCOPY W/ URETERAL STENT PLACEMENT Bilateral 09/07/2013   Procedure: CYSTOSCOPY WITH RETROGRADE PYELOGRAM/URETERAL STENT PLACEMENT;  Surgeon: Alexis Frock, MD;  [facility];  Service: Urology;  Laterality: Bilateral;  . CYSTOSCOPY WITH RETROGRADE PYELOGRAM, URETEROSCOPY AND STENT PLACEMENT Bilateral 09/09/2013   Procedure: CYSTOSCOPY WITH RETROGRADE PYELOGRAM, DIAGNOSTIC URETEROSCOPY AND STENT CHANGE;  Surgeon: Alexis Frock, MD;  [facility];  Service: Urology;  Laterality: Bilateral;  . HOLMIUM LASER APPLICATION Right 4/0/0867    Procedure: HOLMIUM LASER APPLICATION;  Surgeon: Alexis Frock, MD;  [facility];  Service: Urology;  Laterality: Right;  . HOLMIUM LASER APPLICATION Right 08/04/9507   Procedure: HOLMIUM LASER APPLICATION;  Surgeon: Alexis Frock, MD;  [facility];  Service: Urology;  Laterality: Right;  . NEPHROLITHOTOMY Right 09/07/2013   Procedure: FIRST STAGE RIGHT PERCUTANEOUS NEPHROLITHOTOMY  WITH ACCESS, ;  Surgeon: Alexis Frock, MD;  [facility];  Service: Urology;  Laterality: Right;  . NEPHROLITHOTOMY Right 09/09/2013   Procedure: RIGHT 2ND STAGE NEPHROLITHOTOMY PERCUTANEOUS;  Surgeon: Alexis Frock, MD;  [facility];  Service: Urology;  Laterality: Right;  . UMBILICAL HERNIA REPAIR      Assessment / Plan / Recommendation Clinical Impression   [name] [name]. [name] [name] is a 52 year old right-handed male with history of hypertension, tobacco abuse, alcohol abuse, GERD who was admitted on 07/27/2017 with left-sided weakness that started early in a.m.  Patient refused to come to the ED initially.  UDS was positive for cocaine.  CTA head neck/perfusion showed large MCA infarct with high-grade near occlusive stenosis of right-ICA at the bifurcation and occluded right M1 segment with poor collaterals.  R-MCA compatible thrombus. 2D echo done showed showing EF of 55 to 60% with grade 2 diastolic dysfunction and no PFO.  He had declining mental status after admission requiring transfer to ICU.  Dr. Bridgett [name] was consulted for input and felt that immediate surgical intervention likely not helpful with concerns of hemorrhagic conversion.  Follow-up CT head showed mild mass-effect and Dr. Arnoldo [name] recommended treatment with hypertonic saline.  Repeat CT 5/30 showed stable infarct however increase in mass-effect and 7 mm right to  left midline shift with effacement.    Lethargy resolving and he has been monitored conservatively.  He continues to be limited by left inattention and left sided weakness affecting  mobility as well as ability to carry out ADL tasks. Therapy has been ongoing and CIR was recommended due to functional deficits.  SLP evaluation completed 08/06/2017 with the following results:  Pt presents with moderate cognitive deficits characterized by decreased selective attention to tasks, decreased functional problem solving, and decreased recall of daily information.  Left inattention was not evident during table top tasks for evaluation; however, further diagnostic treatment is warranted to determine visual scanning deficits in the context of more dynamic and complex tasks.  Pt also demonstrated decreased safety awareness with impulsivity.  Pt also has a mild dysarthria resulting from left sided oral motor weakness, low vocal intensity, and rapid rushes of speech.  Pt indicated that he was not happy with the way his speech sounds.  While pt was intelligible during today's evaluation, I would suspect that he has decreased intelligibility in noisy and distracting environments due to pt's reports that he feels he has to repeat himself often when communicating with caregivers and family/friends.   As a result, pt would benefit from skilled ST while inpatient in order to maximize functional independence and reduce burden of care prior to discharge.  Anticipate that pt will need 24/7 supervision at discharge in addition to [place] follow up at next level of care.     Skilled Therapeutic Interventions          Cognitive-linguistic evaluation completed with results and recommendations reviewed with patient.   SLP discussed compensatory speech intelligibility strategies, emphasizing slow rate, overarticulation, and increased vocal intensity.  Pt verbalized understanding and all questions were answered to pt's satisfaction at this time.  While pleasant and participatory, pt exhibited minimal effort during assessment tasks which could be related to fatigue versus coping/adjustment post CVA.  Discussed with RN.       SLP Assessment  Patient will need skilled Speech Lanaguage Pathology Services during CIR admission    Recommendations  Recommendations for Other Services: Neuropsych consult(may be beneficial however pt declines at this time ) Patient destination: Home(versus SNF ) Follow up Recommendations: Home Health SLP;Outpatient SLP;Skilled Nursing facility;24 hour supervision/assistance Equipment Recommended: None recommended by SLP    SLP Frequency 3 to 5 out of 7 days   SLP Duration  SLP Intensity  SLP Treatment/Interventions 10-14 days   Minumum of 1-2 x/day, 30 to 90 minutes  Cognitive remediation/compensation;Cueing hierarchy;Environmental controls;Internal/external aids;Functional tasks;Patient/family education    Pain Pain Assessment Pain Scale: 0-10 Pain Score: 0-No pain  Prior Functioning Cognitive/Linguistic Baseline: Within functional limits Type of Home: Mobile home  Lives With: Alone Available Help at Discharge: Family;Available PRN/intermittently Vocation: Full time employment  Function:  Eating Eating                 Cognition Comprehension Comprehension assist level: Follows basic conversation/direction with extra time/assistive device  Expression   Expression assist level: Expresses basic 75 - 89% of the time/requires cueing 10 - 24% of the time. Needs helper to occlude trach/needs to repeat words.  Social Interaction Social Interaction assist level: Interacts appropriately 75 - 89% of the time - Needs redirection for appropriate language or to initiate interaction.  Problem Solving Problem solving assist level: Solves basic 50 - 74% of the time/requires cueing 25 - 49% of the time  Memory Memory assist level: Recognizes or recalls 50 - 74% of  the time/requires cueing 25 - 49% of the time   Short Term Goals: Week 1: SLP Short Term Goal 1 (Week 1): Pt will use slow rate, overarticulation, and increased vocal intensity to achieve intelligibility at the  conversational level in a noisy, distracting environment.  SLP Short Term Goal 2 (Week 1): Pt will recall daily information with min cues for use of memory compensatory strategies.  SLP Short Term Goal 3 (Week 1): Pt will complete semi-complex tasks with min assist verbal cues for functional problem solving  SLP Short Term Goal 4 (Week 1): Pt will selectively attend to task in a moderately distracting environment for 30 minutes with min verbal cues for redirection.   Refer to Care Plan for Long Term Goals  Recommendations for other services: Neuropsych  Discharge Criteria: Patient will be discharged from SLP if patient refuses treatment 3 consecutive times without medical reason, if treatment goals not met, if there is a change in medical status, if patient makes no progress towards goals or if patient is discharged from hospital.  The above assessment, treatment plan, treatment alternatives and goals were discussed and mutually agreed upon: by patient  Emilio Math 08/06/2017, 12:47 PM

## 2017-08-06 NOTE — Progress Notes (Signed)
Orthopedic Tech Progress Note Patient Details:  Landis Gandydward D Reif 08/20/1965 [id number]  Patient ID: Landis GandyEdward D Jewkes, male   DOB: 03/27/1965, 52 y.o.   MRN: [id number]   Nikki DomCrawford, Rozena Fierro 08/06/2017, 10:24 AM Called in hanger brace order; spoke with Marcelino DusterMichelle

## 2017-08-06 NOTE — Progress Notes (Signed)
[facility] PHYSICAL MEDICINE & REHABILITATION     PROGRESS NOTE  Subjective/Complaints:  Patient seen lying in bed this morning. He states he did not sleep well overnight due to being and interview location. He just wants to sleep this morning.  ROS: deny CP, SOB, nausea, vomiting, diarrhea.  Objective: Vital Signs: Blood pressure (!) 161/95, pulse 64, temperature 98.3 F (36.8 C), temperature source Oral, resp. rate 18, height 5\' 5"  (1.651 m), weight 69.5 kg (153 lb 3.5 oz), SpO2 100 %. No results found. Recent Labs    08/05/17 0620 08/06/17 0508  WBC 10.3 10.6*  HGB 18.1* 18.3*  HCT 52.0 52.5*  PLT 271 254   Recent Labs    08/05/17 0620 08/06/17 0508  NA 140 142  K 3.6 3.8  CL 106 106  GLUCOSE 101* 96  BUN 18 18  CREATININE 0.94 0.95  CALCIUM 9.1 9.2   CBG (last 3)  No results for input(s): GLUCAP in the last 72 hours.  Wt Readings from Last 3 Encounters:  08/05/17 69.5 kg (153 lb 3.5 oz)  07/27/17 76.3 kg (168 lb 3.2 oz)  09/07/13 72.2 kg (159 lb 2 oz)    Physical Exam:  BP (!) 161/95 (BP Location: Right Arm)   Pulse 64   Temp 98.3 F (36.8 C) (Oral)   Resp 18   Ht 5\' 5"  (1.651 m)   Wt 69.5 kg (153 lb 3.5 oz)   SpO2 100%   BMI 25.50 kg/m  Constitutional: He appears well-developed and well-nourished.  HENT: Normocephalic and atraumatic.  Eyes: EOM are normal. No discharge.  Cardiovascular: Normal rate and regular rhythm. No JVD. Respiratory: Effort normal and breath sounds normal.  GI: Bowel sounds are normal. Nondistended. Musculoskeletal: No edema or tenderness in extremities  Neurological: He is alert and oriented.Marland Kitchen.  He was able to follow simple motor commands.  Left facial weakness with mild dysarthria.  Left upper extremity: Shoulder abduction, elbow flexion/extension, wrist extension to -/5, hand grip 0/5 Left lower extremity: 3-/5 proximal to distal ? Emergent tone Skin: Skin is warm and dry.  Psychiatric: His affect is blunt.    Assessment/Plan: 1. Functional deficits secondary to right MCA infarct which require 3+ hours per day of interdisciplinary therapy in a comprehensive inpatient rehab setting. Physiatrist is providing close team supervision and 24 hour management of active medical problems listed below. Physiatrist and rehab team continue to assess barriers to discharge/monitor patient progress toward functional and medical goals.  Function:  Bathing Bathing position      Bathing parts      Bathing assist        Upper Body Dressing/Undressing Upper body dressing                    Upper body assist        Lower Body Dressing/Undressing Lower body dressing                                  Lower body assist        Toileting Toileting          Toileting assist     Transfers Chair/bed transfer             Locomotion Ambulation           Wheelchair          Cognition Comprehension    Expression    Social Interaction  Problem Solving    Memory      Medical Problem List and Plan: 1.  Left inattention and left sided weakness affecting mobility as well as ability to carry out ADL tasks secondary to right MCA infarct.  Begin CIR  WHO/PRAFO ordered  Fluoxetine ordered on 6/4 2.  DVT Prophylaxis/Anticoagulation: Pharmaceutical: Lovenox 3. Pain Management: tylenol prn.  4. Mood: LCSW to follow for evaluation and support.  5. Neuropsych: This patient is capable of making decisions on his own behalf. 6. Skin/Wound Care: Routine pressure relief measures.  7. Fluids/Electrolytes/Nutrition: Monitor I/O.   BMP within normal range on 6/4 8. HTN: Monitor BP bid. Was on Lisinopril/HCTZ 20-25 daily at home  Monitor with increased mobility 9. Constipation: Cont bowel meds 10 GERD: Continue Protonix.   11. Tobacco/polysubstance  abuse: Encourage cessation. IS while awake to help with cough.  12. Right ICA stenosis: CEA in 4 weeks 13. Leukocytosis  WBCs  10.6 and 6/4  Afebrile  Continue to monitor  LOS (Days) 1 A FACE TO FACE EVALUATION WAS PERFORMED  Ankit [name] Juba 08/06/2017 9:22 AM

## 2017-08-06 NOTE — Evaluation (Signed)
Physical Therapy Assessment and Plan  Patient Details  Name: BURT PIATEK MRN: [id number] Date of Birth: 07-04-1965  PT Diagnosis: Abnormality of gait, Cognitive deficits, Difficulty walking, Hemiparesis non-dominant, Impaired cognition and Muscle weakness Rehab Potential: Good ELOS: 14-16 days   Today's Date: 08/06/2017 PT Individual Time: 1300-1400 PT Individual Time Calculation (min): 60 min    Problem List:  Patient Active Problem List   Diagnosis Date Noted  . Leukocytosis   . Internal carotid artery stenosis, right   . Cerebral edema ([place]) 08/05/2017  . Hyperlipidemia 08/05/2017  . Tobacco use disorder 08/05/2017  . Family history of stroke 08/05/2017  . Acute ischemic right MCA stroke ([place]) 08/05/2017  . Cerebral infarction due to embolism of right middle cerebral artery ([place])   . Benign essential HTN   . Slow transit constipation   . Gastroesophageal reflux disease   . Carotid stenosis 08/04/2017  . Cocaine abuse ([place]) 08/04/2017  . Acute encephalopathy   . Essential hypertension   . 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 Staghorn calculus 08/24/2013  . Hemorrhoids, internal, with bleeding and Grade 2 prolapse 08/24/2013    Past Medical History:  Past Medical History:  Diagnosis Date  . GERD (gastroesophageal reflux disease)   . Hypertension   . Nephrolithiasis   . Tobacco abuse    Past Surgical History:  Past Surgical History:  Procedure Laterality Date  . CYSTOSCOPY W/ URETERAL STENT PLACEMENT Bilateral 09/07/2013   Procedure: CYSTOSCOPY WITH RETROGRADE PYELOGRAM/URETERAL STENT PLACEMENT;  Surgeon: Alexis Frock, MD;  Location: [facility];  Service: Urology;  Laterality: Bilateral;  . CYSTOSCOPY WITH RETROGRADE PYELOGRAM, URETEROSCOPY AND STENT PLACEMENT Bilateral 09/09/2013   Procedure: CYSTOSCOPY WITH RETROGRADE PYELOGRAM, DIAGNOSTIC URETEROSCOPY AND STENT CHANGE;  Surgeon: Alexis Frock, MD;  Location: [facility];  Service:  Urology;  Laterality: Bilateral;  . HOLMIUM LASER APPLICATION Right 11/04/6382   Procedure: HOLMIUM LASER APPLICATION;  Surgeon: Alexis Frock, MD;  Location: [facility];  Service: Urology;  Laterality: Right;  . HOLMIUM LASER APPLICATION Right 08/09/5991   Procedure: HOLMIUM LASER APPLICATION;  Surgeon: Alexis Frock, MD;  Location: [facility];  Service: Urology;  Laterality: Right;  . NEPHROLITHOTOMY Right 09/07/2013   Procedure: FIRST STAGE RIGHT PERCUTANEOUS NEPHROLITHOTOMY  WITH ACCESS, ;  Surgeon: Alexis Frock, MD;  Location: [facility];  Service: Urology;  Laterality: Right;  . NEPHROLITHOTOMY Right 09/09/2013   Procedure: RIGHT 2ND STAGE NEPHROLITHOTOMY PERCUTANEOUS;  Surgeon: Alexis Frock, MD;  Location: [facility];  Service: Urology;  Laterality: Right;  . UMBILICAL HERNIA REPAIR      Assessment & Plan Clinical Impression:52 year old right-handed male with history of hypertension, tobacco abuse, alcohol abuse, GERD who was admitted on 07/27/2017 with left-sided weakness that started early in a.m. Patient refused to come to the ED initially. UDS was positive for cocaine. CTA head neck/perfusion showed large MCA infarct with high-grade near occlusive stenosis of right-ICA at the bifurcation and occluded right M1 segment with poor collaterals. 2D echo done showed showing EF of 55 to 60% with grade 2 diastolic dysfunction and no PFO. He had declining mental status after admission requiring transfer to ICU. Dr. Vernard Gambles was consulted for input and felt that immediate surgical intervention likely not helpful with concerns of hemorrhagic conversion. Follow-up CT head showed mild mass-effect and Dr. Arnoldo Morale recommended treatment with hypertonic saline. Repeat CT 5/30 showed stable infarct however increase in mass-effect and 7 mm right to left midline shift with effacement. Mentation is improving and he has  been monitored conservatively.   He continues to be limited by left inattention and left sided weakness  affecting mobility as well as ability to carry out ADL tasks.Therapy has been ongoing and CIR was recommended due to functional deficits.  Patient transferred to CIR on 08/05/2017 .   Patient currently requires mod with mobility secondary to muscle weakness, muscle joint tightness and muscle paralysis, decreased cardiorespiratoy endurance, impaired timing and sequencing, abnormal tone, unbalanced muscle activation, decreased coordination and decreased motor planning, decreased attention to left and decreased motor planning, decreased initiation, decreased awareness, decreased problem solving and decreased safety awareness and decreased sitting balance, decreased standing balance, decreased postural control, hemiplegia and decreased balance strategies.  Prior to hospitalization, patient was independent  with mobility and lived with Alone in a Mobile home home.  Home access is one flight per pt (need to clarify with family)Stairs to enter.  Patient will benefit from skilled PT intervention to maximize safe functional mobility, minimize fall risk and decrease caregiver burden for planned discharge home with 24 hour supervision.  Anticipate patient will benefit from follow up [place] at discharge.  PT - End of Session Activity Tolerance: Tolerates 30+ min activity with multiple rests Endurance Deficit: Yes Endurance Deficit Description: fatigues quickly with mobility activities PT Assessment Rehab Potential (ACUTE/IP ONLY): Good PT Barriers to Discharge: Home environment access/layout;Decreased caregiver support PT Patient demonstrates impairments in the following area(s): Balance;Endurance;Motor;Perception;Safety;Behavior;Sensory PT Transfers Functional Problem(s): Bed Mobility;Bed to Chair;Car;Furniture PT Locomotion Functional Problem(s): Ambulation;Wheelchair Mobility;Stairs PT Plan PT Intensity: Minimum of 1-2 x/day ,45 to 90 minutes PT Frequency: 5 out of 7 days PT Duration Estimated Length of Stay:  14-16 days PT Treatment/Interventions: Discharge planning;Ambulation/gait training;Functional mobility training;Psychosocial support;Therapeutic Activities;Wheelchair propulsion/positioning;Therapeutic Exercise;Visual/perceptual remediation/compensation;Neuromuscular re-education;Disease management/prevention;Balance/vestibular training;Cognitive remediation/compensation;DME/adaptive equipment instruction;Pain management;Splinting/orthotics;UE/LE Strength taining/ROM;UE/LE Coordination activities;Stair training;Patient/family education;Community reintegration PT Transfers Anticipated Outcome(s): S PT Locomotion Anticipated Outcome(s): S with LRAD PT Recommendation Recommendations for Other Services: Therapeutic Recreation consult Therapeutic Recreation Interventions: Outing/community reintergration Follow Up Recommendations: Home health PT;24 hour supervision/assistance Equipment Recommended: To be determined  Skilled Therapeutic Intervention Pt received seated in bed, denies pain and agreeable to treatment. PT initial evaluation performed and completed with min/modA overall as described below d/t L hemiparesis, L inattention, impulsivity. Educated pt on rehab process, goals, estimated LOS to be discussed with team after all evaluations completed. Pt remained seated in w/c at end of session, quick release belt intact and all needs in reach.   PT Evaluation Precautions/Restrictions Precautions Precautions: Fall Precaution Comments: L inattention, L hemiparesis Restrictions Weight Bearing Restrictions: No General Chart Reviewed: Yes Response to Previous Treatment: Patient with no complaints from previous session. Family/Caregiver Present: No  Pain Pain Assessment Pain Scale: 0-10 Pain Score: 0-No pain Home Living/Prior Functioning Home Living Available Help at Discharge: Family;Available PRN/intermittently Type of Home: Mobile home  Lives With: Alone Prior Function Vocation: Full time  employment Vision/Perception  Perception Perception: Impaired(L inattention) Praxis Praxis: Intact Cognition Overall Cognitive Status: Impaired/Different from baseline Arousal/Alertness: Awake/alert Orientation Level: Oriented to person;Oriented to place;Oriented to situation Attention: Sustained Sustained Attention: Impaired Sustained Attention Impairment: Functional basic;Verbal basic Memory: Impaired Memory Impairment: Retrieval deficit Awareness: Impaired Awareness Impairment: Emergent impairment Problem Solving: Impaired Problem Solving Impairment: Functional basic Executive Function: Reasoning;Organizing;Self Monitoring;Self Correcting Reasoning: Impaired Reasoning Impairment: Functional basic;Verbal basic Organizing: Impaired Organizing Impairment: Verbal basic;Functional basic Self Monitoring: Impaired Self Monitoring Impairment: Verbal basic;Functional basic Self Correcting: Impaired Self Correcting Impairment: Verbal basic;Functional basic Behaviors: Restless;Impulsive;Poor frustration tolerance Safety/Judgment: Impaired Sensation Sensation Light Touch: Appears Intact(LLE) Coordination  Gross Motor Movements are Fluid and Coordinated: No Heel Shin Test: unable LLE Motor  Motor Motor: Hemiplegia Motor - Skilled Clinical Observations: L hemiparesis  Mobility Bed Mobility Bed Mobility: Supine to Sit Supine to Sit: Supervision/Verbal cueing Transfers Sit to Stand: Contact Guard/Touching assist Stand to Sit: Contact Guard/Touching assist Stand Pivot Transfers: Moderate Assistance - Patient 50 - 74% Stand Pivot Transfer Details: Verbal cues for sequencing;Verbal cues for technique;Verbal cues for precautions/safety;Tactile cues for weight shifting;Tactile cues for sequencing;Tactile cues for placement Locomotion  Gait Ambulation: Yes Gait Assistance: Moderate Assistance - Patient 50-74% Ambulation Distance (Feet): 25 Feet Assistive device: Other (Comment)(hall  rail) Ambulation/Gait Assistance Details: Verbal cues for technique;Verbal cues for precautions/safety;Tactile cues for placement;Tactile cues for weight beaing;Manual facilitation for weight shifting Ambulation/Gait Assistance Details: assist for LLE stance control with buckling/recurvatum noted Gait Gait: Yes Gait Pattern: Impaired Gait Pattern: Step-through pattern;Poor foot clearance - left;Narrow base of support;Decreased trunk rotation;Lateral hip instability;Left genu recurvatum Gait velocity: significantly decreased for age/gender norms Stairs / Additional Locomotion Stairs: Yes Stairs Assistance: Moderate Assistance - Patient 50 - 74% Stair Management Technique: One rail Right;Step to pattern;Forwards Number of Stairs: 8 Height of Stairs: 3 Wheelchair Mobility Wheelchair Mobility: Yes Wheelchair Propulsion: Right upper extremity;Right lower extremity Wheelchair Parts Management: Needs assistance Distance: 100'  Trunk/Postural Assessment  Cervical Assessment Cervical Assessment: Within Functional Limits Thoracic Assessment Thoracic Assessment: Exceptions to WFL(increased kyphosis) Lumbar Assessment Lumbar Assessment: Within Functional Limits Postural Control Postural Control: Deficits on evaluation(delayed righting reflexes, mild LOBs to L side with increased time to recover)  Balance Balance Balance Assessed: Yes Static Sitting Balance Static Sitting - Balance Support: No upper extremity supported;Feet supported Static Sitting - Level of Assistance: 5: Stand by assistance Dynamic Sitting Balance Dynamic Sitting - Balance Support: No upper extremity supported;Feet supported Dynamic Sitting - Level of Assistance: 4: Min Insurance risk surveyor Standing - Balance Support: Right upper extremity supported Static Standing - Level of Assistance: 5: Stand by assistance Dynamic Standing Balance Dynamic Standing - Balance Support: Right upper extremity  supported;During functional activity Dynamic Standing - Level of Assistance: 3: Mod assist Extremity Assessment  RUE Assessment RUE Assessment: Within Functional Limits LUE Assessment LUE Assessment: Exceptions to [facility] General Strength Comments: Pt demonstrates PROM WFLS for all joints with no noted increased tone at this time.   He demonstrates trace digit flexion with no extension and needs max assist to integrate into functional tasks as a stabilizer or active assist. LUE Body System: Neuro Brunstrum levels for arm and hand: Arm;Hand Brunstrum level for arm: Stage III Synergy is performed voluntarily Brunstrum level for hand: Stage II Synergy is developing RLE Assessment RLE Assessment: Within Functional Limits LLE Assessment LLE Assessment: Exceptions to [facility] LLE Strength LLE Overall Strength: Deficits Left Hip Flexion: 2/5 Left Hip Extension: 3-/5 Left Knee Flexion: 4-/5 Left Knee Extension: 4-/5 Left Ankle Dorsiflexion: 4-/5 Left Ankle Plantar Flexion: 4/5 LLE Tone LLE Tone: Mild;Hypertonic Hypertonic Details: extensor tone   See Function Navigator for Current Functional Status.   Refer to Care Plan for Long Term Goals  Recommendations for other services: Therapeutic Recreation  Outing/community reintegration  Discharge Criteria: Patient will be discharged from PT if patient refuses treatment 3 consecutive times without medical reason, if treatment goals not met, if there is a change in medical status, if patient makes no progress towards goals or if patient is discharged from hospital.  The above assessment, treatment plan, treatment alternatives and goals were discussed and mutually agreed upon: by patient  Benjiman Core Tygielski 08/06/2017, 12:59 PM

## 2017-08-06 NOTE — Evaluation (Signed)
Occupational Therapy Assessment and Plan  Patient Details  Name: [name] [name] MRN: [id number] Date of Birth: [birth year]  OT Diagnosis: disturbance of vision, hemiplegia affecting dominant side and muscle weakness (generalized) Rehab Potential: Rehab Potential (ACUTE ONLY): Excellent ELOS: 14-16 days   Today's Date: 08/06/2017 OT Individual Time: 1975-8832 OT Individual Time Calculation (min): 63 min     Problem List:  Patient Active Problem List   Diagnosis Date Noted  . Leukocytosis   . Internal carotid artery stenosis, right   . Cerebral edema ([place]) 08/05/2017  . Hyperlipidemia 08/05/2017  . Tobacco use disorder 08/05/2017  . Family history of stroke 08/05/2017  . Acute ischemic right MCA stroke ([place]) 08/05/2017  . Cerebral infarction due to embolism of right middle cerebral artery ([place])   . Benign essential HTN   . Slow transit constipation   . Gastroesophageal reflux disease   . Carotid stenosis 08/04/2017  . Cocaine abuse ([place]) 08/04/2017  . Acute encephalopathy   . Essential hypertension   . 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 Staghorn calculus 08/24/2013  . Hemorrhoids, internal, with bleeding and Grade 2 prolapse 08/24/2013    Past Medical History:  Past Medical History:  Diagnosis Date  . GERD (gastroesophageal reflux disease)   . Hypertension   . Nephrolithiasis   . Tobacco abuse    Past Surgical History:  Past Surgical History:  Procedure Laterality Date  . CYSTOSCOPY W/ URETERAL STENT PLACEMENT Bilateral 09/07/2013   Procedure: CYSTOSCOPY WITH RETROGRADE PYELOGRAM/URETERAL STENT PLACEMENT;  Surgeon: Alexis Frock, MD;  Location: [facility];  Service: Urology;  Laterality: Bilateral;  . CYSTOSCOPY WITH RETROGRADE PYELOGRAM, URETEROSCOPY AND STENT PLACEMENT Bilateral 09/09/2013   Procedure: CYSTOSCOPY WITH RETROGRADE PYELOGRAM, DIAGNOSTIC URETEROSCOPY AND STENT CHANGE;  Surgeon: Alexis Frock, MD;  Location: [facility];   Service: Urology;  Laterality: Bilateral;  . HOLMIUM LASER APPLICATION Right 07/06/9824   Procedure: HOLMIUM LASER APPLICATION;  Surgeon: Alexis Frock, MD;  Location: [facility];  Service: Urology;  Laterality: Right;  . HOLMIUM LASER APPLICATION Right 06/03/5828   Procedure: HOLMIUM LASER APPLICATION;  Surgeon: Alexis Frock, MD;  Location: [facility];  Service: Urology;  Laterality: Right;  . NEPHROLITHOTOMY Right 09/07/2013   Procedure: FIRST STAGE RIGHT PERCUTANEOUS NEPHROLITHOTOMY  WITH ACCESS, ;  Surgeon: Alexis Frock, MD;  Location: [facility];  Service: Urology;  Laterality: Right;  . NEPHROLITHOTOMY Right 09/09/2013   Procedure: RIGHT 2ND STAGE NEPHROLITHOTOMY PERCUTANEOUS;  Surgeon: Alexis Frock, MD;  Location: [facility];  Service: Urology;  Laterality: Right;  . UMBILICAL HERNIA REPAIR      Assessment & Plan Clinical Impression: Patient is a 52 y.o. year old male with recent admission to the hospital on 07/27/2017 with left-sided weakness that started early in a.m.  Patient refused to come to the ED initially.  UDS was positive for cocaine.  CTA head neck/perfusion showed large MCA infarct with high-grade near occlusive stenosis of right-ICA at the bifurcation and occluded right M1 segment with poor collaterals.  R-MCA compatible thrombus. 2D echo done showed showing EF of 55 to 60% with grade 2 diastolic dysfunction and no PFO.  He had declining mental status after admission requiring transfer to ICU.  Dr. Bridgett Larsson was consulted for input and felt that immediate surgical intervention likely not helpful with concerns of hemorrhagic conversion.  Follow-up CT head showed mild mass-effect and Dr. Arnoldo Morale recommended treatment with hypertonic saline.  Patient transferred to CIR on 08/05/2017 .    Patient currently requires  mod with basic self-care skills secondary to muscle weakness, impaired timing and sequencing, unbalanced muscle activation and decreased coordination, field cut, decreased attention to left and  left side neglect, decreased initiation, decreased attention, decreased problem solving, decreased safety awareness, decreased memory and delayed processing and decreased standing balance, decreased postural control, hemiplegia and decreased balance strategies.  Prior to hospitalization, patient could complete ADLs with independent .  Patient will benefit from skilled intervention to decrease level of assist with basic self-care skills and increase independence with basic self-care skills prior to discharge home with care partner.  Anticipate patient will require 24 hour supervision and follow up outpatient.  OT - End of Session Activity Tolerance: Tolerates 30+ min activity with multiple rests Endurance Deficit: Yes Endurance Deficit Description: fatigues quickly with mobility activities OT Assessment Rehab Potential (ACUTE ONLY): Excellent OT Barriers to Discharge: Decreased caregiver support OT Barriers to Discharge Comments: lives alone and sister works during the day OT Patient demonstrates impairments in the following area(s): Balance;Cognition;Motor;Endurance;Sensory;Safety;Perception;Vision OT Basic ADL's Functional Problem(s): Bathing;Grooming;Dressing;Toileting OT Advanced ADL's Functional Problem(s): Simple Meal Preparation OT Transfers Functional Problem(s): Tub/Shower;Toilet OT Additional Impairment(s): Fuctional Use of Upper Extremity OT Plan OT Intensity: Minimum of 1-2 x/day, 45 to 90 minutes OT Frequency: 5 out of 7 days OT Duration/Estimated Length of Stay: 14-16 days OT Treatment/Interventions: Balance/vestibular training;Cognitive remediation/compensation;Community reintegration;DME/adaptive equipment instruction;Disease mangement/prevention;Discharge planning;Functional electrical stimulation;Pain management;Self Care/advanced ADL retraining;Therapeutic Activities;UE/LE Coordination activities;Visual/perceptual remediation/compensation;Therapeutic Exercise;Patient/family  education;Functional mobility training;Neuromuscular re-education;Psychosocial support;Splinting/orthotics;UE/LE Strength taining/ROM;Wheelchair propulsion/positioning OT Self Feeding Anticipated Outcome(s): modified independent OT Basic Self-Care Anticipated Outcome(s): supervision OT Toileting Anticipated Outcome(s): supervision OT Bathroom Transfers Anticipated Outcome(s): supervision OT Recommendation Patient destination: Home Follow Up Recommendations: Outpatient OT Equipment Recommended: To be determined   Skilled Therapeutic Intervention Began education with selfcare retraining sit to stand at the sink.  Pt impulsive with transfers attempted to pivot to the chair when instructions were just to sit on the EOB.  Max hand over hand to use the LUE for washing the right arm.  Min assist for sit to stand and standing balance.  Pt needed assist with setting up washcloth as he did not originally put any soap on it even with cueing.  During dressing tasks pt neglected the LLE and attempted to stand and pull his pants over his hips without the LLE being in a pants leg.  He did donn shirt over the RUE and head but could not then place the LUE in the sleeve.  Mod assist for short distance functional mobility to the room door and back.  Increased hyperextension of the left knee with trunk flexion noted and circumduction when attempting to advance the LLE.  Decreased sustained stance on the LLE as well when advancing the RLE.  Pt left in wheelchair at end of session with nursing present.  Call button in reach, LUE supported on half lap tray with education on AAROM exercises using washcloth.  Chair alarm and safety belt also in place.    OT Evaluation Precautions/Restrictions  Precautions Precautions: Fall Precaution Comments: L inattention, L hemiparesis Restrictions Weight Bearing Restrictions: No  Pain Pain Assessment Pain Score: 0-No pain Home Living/Prior Functioning Home  Living Family/patient expects to be discharged to:: Private residence Living Arrangements: Alone Available Help at Discharge: Family, Available PRN/intermittently Type of Home: Mobile home Home Access: Stairs to enter [employer] of Steps: one flight per pt (need to clarify with family) Home Layout: One level Bathroom Shower/Tub: Tub/shower unit Additional Comments: 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 sister providing details of home setup .  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 Prior Function Level of Independence: Independent with basic ADLs  Able to Take Stairs?: Yes Driving: Yes Vocation: Full time employment Leisure: Hobbies-yes (Comment) Comments: hunting, fishing ADL  See Function Section of chart for details  Vision Baseline Vision/History: Wears glasses Wears Glasses: Reading only Patient Visual Report: No change from baseline Vision Assessment?: Yes Eye Alignment: Within Functional Limits Ocular Range of Motion: Within Functional Limits Alignment/Gaze Preference: Head turned(right head turn) Tracking/Visual Pursuits: Decreased smoothness of horizontal tracking;Decreased smoothness of vertical tracking Convergence: Within functional limits Visual Fields: Right visual field deficit Perception  Perception: Impaired Inattention/Neglect: Does not attend to left visual field;Does not attend to right side of body(Pt forgot to put his left leg in his shorts) Praxis Praxis: Intact Cognition Overall Cognitive Status:  Impaired/Different from baseline Arousal/Alertness: Awake/alert Orientation Level: Person;Place;Situation Person: Oriented Place: Oriented Situation: Oriented Year: 2019 Month: June Day of Week: Incorrect(Monday) Memory: Impaired Memory Impairment: Decreased recall of new information Decreased Short Term Memory: Functional basic Immediate Memory Recall: Sock;Blue;Bed Memory Recall: Blue;Bed Memory Recall Blue: Without Cue Memory Recall Bed: Without Cue Attention: Sustained Sustained Attention: Impaired Sustained Attention Impairment: Functional basic Selective Attention: Impaired Selective Attention Impairment: Functional basic Awareness: Impaired Awareness Impairment: Emergent impairment;Anticipatory impairment Problem Solving: Impaired Problem Solving Impairment: Functional basic Executive Function: Reasoning;Organizing;Self Monitoring;Self Correcting Reasoning: Impaired Reasoning Impairment: Functional basic;Verbal basic Organizing: Impaired Organizing Impairment: Verbal basic;Functional basic Self Monitoring: Impaired Self Monitoring Impairment: Verbal basic;Functional basic Self Correcting: Impaired Self Correcting Impairment: Verbal basic;Functional basic Behaviors: Impulsive;Poor frustration tolerance Safety/Judgment: Impaired Comments: Pt spontaneously standing up at the sink without cueing or awareness of safety.   Sensation Sensation Light Touch: Impaired Detail Light Touch Impaired Details: Impaired LUE Proprioception: Impaired Detail Proprioception Impaired Details: Impaired LUE Stereognosis: Not tested Coordination Gross Motor Movements are Fluid and Coordinated: No Fine Motor Movements are Fluid and Coordinated: No Coordination and Movement Description: Pt currently Brunnstrum stage III in the left arm and stage II in the hand.  Needs max hand over hand assist to integrate into functional tasks. Heel Shin Test: unable LLE Motor  Motor Motor:  Hemiplegia Motor - Skilled Clinical Observations: L hemiparesis Mobility  Bed Mobility Bed Mobility: Supine to Sit Supine to Sit: Supervision/Verbal cueing Transfers Sit to Stand: Contact Guard/Touching assist Stand to Sit: Contact Guard/Touching assist  Trunk/Postural Assessment  Cervical Assessment Cervical Assessment: Exceptions to WFL(head rotation to the right secondary to neglect) Thoracic Assessment Thoracic Assessment: Exceptions to WFL(slight thoracic rounding) Lumbar Assessment Lumbar Assessment: Within Functional Limits Postural Control Postural Control: Deficits on evaluation  Balance Balance Balance Assessed: Yes Static Sitting Balance Static Sitting - Balance Support: No upper extremity supported;Feet supported Static Sitting - Level of Assistance: 5: Stand by assistance Dynamic Sitting Balance Dynamic Sitting - Balance Support: During functional activity Dynamic Sitting - Level of Assistance: 4: Min Insurance risk surveyor Standing - Balance Support: During functional activity Static Standing - Level of Assistance: 4: Min assist Dynamic Standing Balance Dynamic Standing - Balance Support: Right upper extremity supported;During functional activity Dynamic Standing - Level of  Assistance: 3: Mod assist Extremity/Trunk Assessment RUE Assessment RUE Assessment: Within Functional Limits LUE Assessment LUE Assessment: Exceptions to [facility] General Strength Comments: Pt demonstrates PROM WFLS for all joints with no noted increased tone at this time.   He demonstrates trace digit flexion with no extension and needs max assist to integrate into functional tasks as a stabilizer or active assist. LUE Body System: Neuro Brunstrum levels for arm and hand: Arm;Hand Brunstrum level for arm: Stage III Synergy is performed voluntarily Brunstrum level for hand: Stage II Synergy is developing   See Function Navigator for Current Functional Status.   Refer to Care  Plan for Long Term Goals  Recommendations for other services: Neuropsych   Discharge Criteria: Patient will be discharged from OT if patient refuses treatment 3 consecutive times without medical reason, if treatment goals not met, if there is a change in medical status, if patient makes no progress towards goals or if patient is discharged from hospital.  The above assessment, treatment plan, treatment alternatives and goals were discussed and mutually agreed upon: by patient  Tanayia Wahlquist OTR/L 08/06/2017, 5:01 PM

## 2017-08-07 ENCOUNTER — Inpatient Hospital Stay (HOSPITAL_COMMUNITY): Payer: Managed Care, Other (non HMO) | Admitting: Speech Pathology

## 2017-08-07 ENCOUNTER — Inpatient Hospital Stay (HOSPITAL_COMMUNITY): Payer: Managed Care, Other (non HMO) | Admitting: Physical Therapy

## 2017-08-07 ENCOUNTER — Inpatient Hospital Stay (HOSPITAL_COMMUNITY): Payer: Managed Care, Other (non HMO) | Admitting: Occupational Therapy

## 2017-08-07 DIAGNOSIS — G479 Sleep disorder, unspecified: Secondary | ICD-10-CM

## 2017-08-07 MED ORDER — MELATONIN 3 MG PO TABS
1.5000 mg | ORAL_TABLET | Freq: Every day | ORAL | Status: DC
  Administered 2017-08-07 – 2017-08-08 (×2): 1.5 mg via ORAL
  Filled 2017-08-07 (×2): qty 0.5

## 2017-08-07 MED ORDER — NON FORMULARY: 1.5000 mg | Freq: Every day | Status: DC

## 2017-08-07 NOTE — Progress Notes (Signed)
Occupational Therapy Session Note  Patient Details  Name: [name] [name] MRN: [id number] Date of Birth: 04/23/1965  Today's Date: 08/07/2017 OT Individual Time: 1004-1100 OT Individual Time Calculation (min): 56 min    Short Term Goals: Week 1:  OT Short Term Goal 1 (Week 1): Pt will complete UB dressing with supervision and min instructional cueing for donning pullover shirt.  OT Short Term Goal 2 (Week 1): Pt will complete LB bathing with supervision sit to stand.  OT Short Term Goal 3 (Week 1): Pt will complete toilet transfers with min guard assist using the RW to the elevated toilet. OT Short Term Goal 4 (Week 1): Pt will complete walk-in shower transfers with min guard assist using the RW to the tub bench. OT Short Term Goal 5 (Week 1): Pt will use the LUE as an gross assist for holding items to be opened with no more than mod instructional cueing and min assist.   Skilled Therapeutic Interventions/Progress Updates:    Pt completed bathing and dressing during session.  Mod assist for transfer into and out of the shower with hand held assist.  Decreased ability to clear and advance the LLE as well as tolerate weightbearing without trunk compensation.  [name] [name] was able to complete bathing with min assist sit to stand and max hand over hand to integrate the LUE.  Completed dressing sit to stand with min assist and max instructional cueing for hemidressing techniques.  Pt tends to demonstrate delayed physical movement to command.  For example when told to not stand up, therapist had to physically stop him on multiple attempts secondary to delayed understanding and impulsivity.  Educated pt on self AAROM exercises for the LUE during session and provided handout.  Pt needed mod instructional cueing to slow down and concentrate on moving the LUE as much as possible.  Finished session with pt in the wheelchair and PT present to work with him.    Therapy Documentation Precautions:   Precautions Precautions: Fall Precaution Comments: L inattention, L hemiparesis Restrictions Weight Bearing Restrictions: No  Pain: Pain Assessment Pain Scale: 0-10 Pain Score: 0-No pain ADL: See Function Navigator for Current Functional Status.   Therapy/Group: Individual Therapy  Feliz Herard OTR/L 08/07/2017, 11:03 AM

## 2017-08-07 NOTE — Progress Notes (Signed)
Encouraged pt to drink more fluids. Presented some impulsive behaviors today. He is easy to redirect. Bed alarm and chair was administered during shift.

## 2017-08-07 NOTE — Plan of Care (Signed)
  Problem: RH SAFETY Goal: RH STG ADHERE TO SAFETY PRECAUTIONS W/ASSISTANCE/DEVICE Description STG Adhere to Safety Precautions With cues/supervision Assistance/Device.  Outcome: Progressing  Call light at hand, proper foot wear.

## 2017-08-07 NOTE — Progress Notes (Signed)
Physical Therapy Session Note  Patient Details  Name: [name] [name] MRN: [id number] Date of Birth: 01/15/1966  Today's Date: 08/07/2017 PT Individual Time: 1300-1400 PT Individual Time Calculation (min): 60 min   Short Term Goals: Week 1:  PT Short Term Goal 1 (Week 1): Pt will perform bed mobility S from flat bed PT Short Term Goal 2 (Week 1): Pt will transfer w/c <>bed with consistent minA PT Short Term Goal 3 (Week 1): pt wll ambulate x75' with LRAD and minA PT Short Term Goal 4 (Week 1): Pt will ascend/descent 8 steps minA   Skilled Therapeutic Interventions/Progress Updates: Pt received seated on toilet with handoff from RN; S for sitting balance. Pt unrolling toilet paper on floor and required cues to stop as that much toilet paper was not needed to toileting. Gait out of bathroom min/modA with RW; no hand splint on walker and LUE unable to maintain on handle. Standing balance min guard while washing hands. W/c propulsion R hemi technique x150', mod cues for attention to L side of environment, and cues for coordination for UE/LE for navigation. Gait x90' with RW, L hand splint and modA; cues for increased L step length, tactile cue at L glutes for hip extension in stance. Standing toe taps to 3" step with RW and BLE for focus on LLE stance control and coordination. Sit >supine with S. Bridging with hip adduction isometric, cues for slow control concentric/eccentric. Supine>prone>tall kneeling minA; side stepping R/L with tactile cues for hip/trunk extension. Returned to w/c minA stand pivot. Stand pivot to bed minA, sit >supine with S. Remained supine in bed with alarm intact, all needs in reach.      Therapy Documentation Precautions:  Precautions Precautions: Fall Precaution Comments: L inattention, L hemiparesis Restrictions Weight Bearing Restrictions: No Pain: Pain Assessment Pain Score: 0-No pain  See Function Navigator for Current Functional Status.   Therapy/Group:  Individual Therapy  Vista Lawmanlizabeth J Tygielski 08/07/2017, 2:04 PM

## 2017-08-07 NOTE — Progress Notes (Signed)
[place] PHYSICAL MEDICINE & REHABILITATION     PROGRESS NOTE  Subjective/Complaints:  Patient seen lying in bed this AM.  He did not sleep well overnight, confirmed with sleep chart.    ROS: Denies CP, SOB, nausea, vomiting, diarrhea.  Objective: Vital Signs: Blood pressure (!) 144/95, pulse 70, temperature 97.7 F (36.5 C), temperature source Oral, resp. rate 18, height 5\' 5"  (1.651 m), weight 69.5 kg (153 lb 3.5 oz), SpO2 96 %. No results found. Recent Labs    08/05/17 0620 08/06/17 0508  WBC 10.3 10.6*  HGB 18.1* 18.3*  HCT 52.0 52.5*  PLT 271 254   Recent Labs    08/05/17 0620 08/06/17 0508  NA 140 142  K 3.6 3.8  CL 106 106  GLUCOSE 101* 96  BUN 18 18  CREATININE 0.94 0.95  CALCIUM 9.1 9.2   CBG (last 3)  No results for input(s): GLUCAP in the last 72 hours.  Wt Readings from Last 3 Encounters:  08/05/17 69.5 kg (153 lb 3.5 oz)  07/27/17 76.3 kg (168 lb 3.2 oz)  09/07/13 72.2 kg (159 lb 2 oz)    Physical Exam:  BP (!) 144/95 (BP Location: Right Arm)   Pulse 70   Temp 97.7 F (36.5 C) (Oral)   Resp 18   Ht 5\' 5"  (1.651 m)   Wt 69.5 kg (153 lb 3.5 oz)   SpO2 96%   BMI 25.50 kg/m  Constitutional: He appears well-developed and well-nourished.  HENT: Normocephalic and atraumatic.  Eyes: EOM are normal. No discharge.  Cardiovascular: RRR. No JVD. Respiratory: Effort normal and breath sounds normal.  GI: Bowel sounds are normal. Nondistended. Musculoskeletal: No edema or tenderness in extremities  Neurological: He is alert and oriented.Marland Kitchen  He was able to follow simple motor commands.  Left facial weakness with mild dysarthria.  Left upper extremity: Shoulder abduction, elbow flexion/extension 4-/5, wrist extension, hand grip 0/5 Left lower extremity: 4/5 proximal to distal ? Emergent tone Skin: Skin is warm and dry.  Psychiatric: His affect is blunt.   Assessment/Plan: 1. Functional deficits secondary to right MCA infarct which require 3+ hours  per day of interdisciplinary therapy in a comprehensive inpatient rehab setting. Physiatrist is providing close team supervision and 24 hour management of active medical problems listed below. Physiatrist and rehab team continue to assess barriers to discharge/monitor patient progress toward functional and medical goals.  Function:  Bathing Bathing position   Position: Wheelchair/chair at sink  Bathing parts Body parts bathed by patient: Chest, Abdomen, Left arm, Front perineal area, Buttocks, Right upper leg, Left upper leg, Right lower leg Body parts bathed by helper: Left lower leg, Back, Right arm  Bathing assist        Upper Body Dressing/Undressing Upper body dressing   What is the patient wearing?: Pull over shirt/dress     Pull over shirt/dress - Perfomed by patient: Thread/unthread right sleeve, Put head through opening Pull over shirt/dress - Perfomed by helper: Thread/unthread left sleeve, Pull shirt over trunk        Upper body assist        Lower Body Dressing/Undressing Lower body dressing   What is the patient wearing?: Pants, Non-skid slipper socks     Pants- Performed by patient: Thread/unthread right pants leg, Pull pants up/down   Non-skid slipper socks- Performed by patient: Don/doff right sock Non-skid slipper socks- Performed by helper: Don/doff left sock  Lower body assist        Toileting Toileting   Toileting steps completed by patient: Adjust clothing prior to toileting, Adjust clothing after toileting(wearing gown) Toileting steps completed by helper: Adjust clothing prior to toileting, Adjust clothing after toileting, Performs perineal hygiene    Toileting assist Assist level: Touching or steadying assistance (Pt.75%)   Transfers Chair/bed transfer   Chair/bed transfer method: Stand pivot Chair/bed transfer assist level: Touching or steadying assistance (Pt > 75%) Chair/bed transfer assistive device: Armrests      Locomotion Ambulation     Max distance: 15" Assist level: Moderate assist (Pt 50 - 74%)   Wheelchair   Type: Manual Max wheelchair distance: 100 Assist Level: Touching or steadying assistance (Pt > 75%)  Cognition Comprehension Comprehension assist level: Follows basic conversation/direction with extra time/assistive device  Expression Expression assist level: Expresses basic 75 - 89% of the time/requires cueing 10 - 24% of the time. Needs helper to occlude trach/needs to repeat words.  Social Interaction Social Interaction assist level: Interacts appropriately 75 - 89% of the time - Needs redirection for appropriate language or to initiate interaction.  Problem Solving Problem solving assist level: Solves basic 50 - 74% of the time/requires cueing 25 - 49% of the time  Memory Memory assist level: Recognizes or recalls 50 - 74% of the time/requires cueing 25 - 49% of the time    Medical Problem List and Plan: 1.  Left inattention and left sided weakness affecting mobility as well as ability to carry out ADL tasks secondary to right MCA infarct.  Cont CIR  WHO/PRAFO ordered, not present  Fluoxetine ordered on 6/4 2.  DVT Prophylaxis/Anticoagulation: Pharmaceutical: Lovenox 3. Pain Management: tylenol prn.  4. Mood: LCSW to follow for evaluation and support.  5. Neuropsych: This patient is capable of making decisions on his own behalf. 6. Skin/Wound Care: Routine pressure relief measures.  7. Fluids/Electrolytes/Nutrition: Monitor I/O.   BMP within normal range on 6/4 8. HTN: Monitor BP bid. Was on Lisinopril/HCTZ 20-25 daily at home  Labile on 6/5 9. Constipation: Cont bowel meds 10 GERD: Continue Protonix.   11. Tobacco/polysubstance  abuse: Encourage cessation. IS while awake to help with cough.  12. Right ICA stenosis: CEA in 4 weeks 13. Leukocytosis  WBCs 10.6 and 6/4  Afebrile  Continue to monitor 14. Sleep disturbance  Melatonin started on 6/5  LOS (Days) 2 A FACE  TO FACE EVALUATION WAS PERFORMED  [name] [name] 08/07/2017 8:19 AM

## 2017-08-07 NOTE — Progress Notes (Signed)
Physical Therapy Session Note  Patient Details  Name: [name] [name] MRN: [id number] Date of Birth: 03/06/1965  Today's Date: 08/07/2017 PT Individual Time: 1100-1130 PT Individual Time Calculation (min): 30 min   Short Term Goals: Week 1:  PT Short Term Goal 1 (Week 1): Pt will perform bed mobility S from flat bed PT Short Term Goal 2 (Week 1): Pt will transfer w/c <>bed with consistent minA PT Short Term Goal 3 (Week 1): pt wll ambulate x75' with LRAD and minA PT Short Term Goal 4 (Week 1): Pt will ascend/descent 8 steps minA   Skilled Therapeutic Interventions/Progress Updates:    Pt received seated in w/c in room, agreeable to PT. No complaints of pain. Sit to stand in // bars with min A with use of RUE on bar. Fwd/backward stepping with min A for balance and max v/c for technique. Pt exhibits L hip flexor and extensor weakness and exhibits hip hiking with gait. Standing mini-squats 2 x 6 reps before pt fatigues. Standing RLE 3" step taps with mod A for balance and max v/c to perform safely and correcting. Standing LLE marches x 10 reps. Pt fatigues quickly in standing and impulsively tries to sit often throughout therapy session with poor awareness of safety and if w/c is behind him. Pt left seated in w/c in room with needs in reach, quick release belt, chair alarm, and lap tray in place.  Therapy Documentation Precautions:  Precautions Precautions: Fall Precaution Comments: L inattention, L hemiparesis Restrictions Weight Bearing Restrictions: No  See Function Navigator for Current Functional Status.   Therapy/Group: Individual Therapy  Peter Congoaylor Jayden Rudge, PT, DPT  08/07/2017, 12:09 PM

## 2017-08-07 NOTE — Progress Notes (Signed)
Speech Language Pathology Daily Session Note  Patient Details  Name: [name] [name] MRN: [id number] Date of Birth: 05/19/1965  Today's Date: 08/07/2017 SLP Individual Time: [id number] SLP Individual Time Calculation (min): 55 min  Short Term Goals: Week 1: SLP Short Term Goal 1 (Week 1): Pt will use slow rate, overarticulation, and increased vocal intensity to achieve intelligibility at the conversational level in a noisy, distracting environment.  SLP Short Term Goal 2 (Week 1): Pt will recall daily information with min cues for use of memory compensatory strategies.  SLP Short Term Goal 3 (Week 1): Pt will complete semi-complex tasks with min assist verbal cues for functional problem solving  SLP Short Term Goal 4 (Week 1): Pt will selectively attend to task in a moderately distracting environment for 30 minutes with min verbal cues for redirection.  SLP Short Term Goal 5 (Week 1): Pt will locate items to the left of midline during functional tasks for >75% accuracy with min assist verbal cues.   Skilled Therapeutic Interventions:  Pt was seen for skilled ST targeting cognitive goals.  Pt was awake in bed upon arrival and agreeable to getting out of bed for therapies.  Pt needed min verbal cues to apply brakes to wheelchair while completing basic sinkside ADLs prior to leaving for therapy session.  Additionally, pt needed mod-max assist verbal cues to locate items to the left of midline that were needed for oral care and washing face.  SLP utilized the [employer] for further assessment of visual scanning deficits.  Reaction time was the same in both right and left visual fields when locating targets until therapist increased task challenge by incorporating time constraints, at which point pt's response latency increased and his accuracy decreased drastically.  Discussed functional implications for visual inattention as it pertains to higher level ADLs.  Pt verbalized understanding but will need  continued reinforcement while inpatient.  SLP also facilitated the session with a novel card game to address problem solving goals.  Pt initially needed max assist multimodal cues for planning and organization of problem solving strategy due to impulsivity.  However, as task progressed therapist was able to fade cues to supervision for problem solving.  Discussed the importance of carrying out tasks in a deliberate and organized manner for pt's safety.  Pt was returned to room and left in wheelchair with quick release belt donned, chair alarm set, and call bell within reach.  Continue per current plan of care.   Function:  Eating Eating                 Cognition Comprehension Comprehension assist level: Follows basic conversation/direction with extra time/assistive device  Expression   Expression assist level: Expresses basic 75 - 89% of the time/requires cueing 10 - 24% of the time. Needs helper to occlude trach/needs to repeat words.  Social Interaction Social Interaction assist level: Interacts appropriately 75 - 89% of the time - Needs redirection for appropriate language or to initiate interaction.  Problem Solving Problem solving assist level: Solves basic 50 - 74% of the time/requires cueing 25 - 49% of the time  Memory Memory assist level: Recognizes or recalls 50 - 74% of the time/requires cueing 25 - 49% of the time    Pain Pain Assessment Pain Scale: 0-10 Pain Score: 0-No pain  Therapy/Group: Individual Therapy  [place] Kuang, Melanee SpryNicole L 08/07/2017, 9:49 AM

## 2017-08-08 ENCOUNTER — Inpatient Hospital Stay (HOSPITAL_COMMUNITY): Payer: Managed Care, Other (non HMO) | Admitting: Occupational Therapy

## 2017-08-08 ENCOUNTER — Inpatient Hospital Stay (HOSPITAL_COMMUNITY): Payer: Managed Care, Other (non HMO) | Admitting: Speech Pathology

## 2017-08-08 ENCOUNTER — Inpatient Hospital Stay (HOSPITAL_COMMUNITY): Payer: Managed Care, Other (non HMO) | Admitting: Physical Therapy

## 2017-08-08 MED ORDER — CEPHALEXIN 250 MG PO CAPS
500.0000 mg | ORAL_CAPSULE | Freq: Three times a day (TID) | ORAL | Status: DC
  Administered 2017-08-08 – 2017-08-09 (×3): 500 mg via ORAL
  Filled 2017-08-08 (×4): qty 2

## 2017-08-08 MED ORDER — TRAZODONE HCL 50 MG PO TABS
50.0000 mg | ORAL_TABLET | Freq: Every day | ORAL | Status: DC
  Administered 2017-08-08 – 2017-08-09 (×2): 50 mg via ORAL
  Filled 2017-08-08 (×2): qty 1

## 2017-08-08 MED ORDER — FLUOXETINE HCL 10 MG PO CAPS
10.0000 mg | ORAL_CAPSULE | Freq: Every day | ORAL | Status: DC
  Administered 2017-08-08 – 2017-08-09 (×2): 10 mg via ORAL
  Filled 2017-08-08 (×2): qty 1

## 2017-08-08 MED ORDER — NICOTINE 14 MG/24HR TD PT24
14.0000 mg | MEDICATED_PATCH | Freq: Every day | TRANSDERMAL | Status: DC
  Administered 2017-08-08 – 2017-08-11 (×4): 14 mg via TRANSDERMAL
  Filled 2017-08-08 (×4): qty 1

## 2017-08-08 MED ORDER — FLUOXETINE HCL 10 MG PO CAPS: 10.0000 mg | ORAL_CAPSULE | Freq: Every day | ORAL | Status: DC

## 2017-08-08 NOTE — Progress Notes (Signed)
[place] PHYSICAL MEDICINE & REHABILITATION     PROGRESS NOTE  Subjective/Complaints:  Patient seen lying in bed this morning. He states he did not sleep well overnight. For sleep chart, patient slept better, but intermittently awoke.  ROS: denies CP, SOB, nausea, vomiting, diarrhea.  Objective: Vital Signs: Blood pressure (!) 124/59, pulse 68, temperature 98.2 F (36.8 C), temperature source Oral, resp. rate 19, height 5\' 5"  (1.651 m), weight 72.2 kg (159 lb 2.8 oz), SpO2 97 %. No results found. Recent Labs    08/06/17 0508  WBC 10.6*  HGB 18.3*  HCT 52.5*  PLT 254   Recent Labs    08/06/17 0508  NA 142  K 3.8  CL 106  GLUCOSE 96  BUN 18  CREATININE 0.95  CALCIUM 9.2   CBG (last 3)  No results for input(s): GLUCAP in the last 72 hours.  Wt Readings from Last 3 Encounters:  08/07/17 72.2 kg (159 lb 2.8 oz)  07/27/17 76.3 kg (168 lb 3.2 oz)  09/07/13 72.2 kg (159 lb 2 oz)    Physical Exam:  BP (!) 124/59 (BP Location: Right Arm)   Pulse 68   Temp 98.2 F (36.8 C) (Oral)   Resp 19   Ht 5\' 5"  (1.651 m)   Wt 72.2 kg (159 lb 2.8 oz)   SpO2 97%   BMI 26.49 kg/m  Constitutional: He appears well-developed and well-nourished.  HENT: Normocephalic and atraumatic.  Eyes: EOM are normal. No discharge.  Cardiovascular: RRR. No JVD. Respiratory: Effort normal and breath sounds normal.  GI: Bowel sounds are normal. Nondistended. Musculoskeletal: No edema or tenderness in extremities  Neurological: He is alert and oriented.Marland Kitchen  He was able to follow simple motor commands.  Left facial weakness with mild dysarthria.  Left upper extremity: Shoulder abduction, elbow flexion/extension 4-/5, wrist extension, hand grip 0/5 (stable) Left lower extremity: 4/5 proximal to distal Emerging tone Skin: Skin is warm and dry.  Psychiatric: His affect is blunt.   Assessment/Plan: 1. Functional deficits secondary to right MCA infarct which require 3+ hours per day of  interdisciplinary therapy in a comprehensive inpatient rehab setting. Physiatrist is providing close team supervision and 24 hour management of active medical problems listed below. Physiatrist and rehab team continue to assess barriers to discharge/monitor patient progress toward functional and medical goals.  Function:  Bathing Bathing position   Position: Shower  Bathing parts Body parts bathed by patient: Chest, Abdomen, Left arm, Front perineal area, Buttocks, Right upper leg, Left upper leg, Right lower leg Body parts bathed by helper: Left lower leg, Back, Right arm  Bathing assist        Upper Body Dressing/Undressing Upper body dressing   What is the patient wearing?: Pull over shirt/dress     Pull over shirt/dress - Perfomed by patient: Put head through opening, Thread/unthread left sleeve, Thread/unthread right sleeve Pull over shirt/dress - Perfomed by helper: Pull shirt over trunk        Upper body assist        Lower Body Dressing/Undressing Lower body dressing   What is the patient wearing?: Pants, Non-skid slipper socks     Pants- Performed by patient: Thread/unthread right pants leg, Thread/unthread left pants leg Pants- Performed by helper: Pull pants up/down Non-skid slipper socks- Performed by patient: Don/doff right sock, Don/doff left sock Non-skid slipper socks- Performed by helper: Don/doff left sock                  Lower  body assist        Toileting Toileting   Toileting steps completed by patient: Adjust clothing prior to toileting, Adjust clothing after toileting(wearing gown) Toileting steps completed by helper: Adjust clothing prior to toileting, Adjust clothing after toileting, Performs perineal hygiene    Toileting assist Assist level: Touching or steadying assistance (Pt.75%)   Transfers Chair/bed transfer   Chair/bed transfer method: Stand pivot Chair/bed transfer assist level: Touching or steadying assistance (Pt >  75%) Chair/bed transfer assistive device: Armrests     Locomotion Ambulation     Max distance: 90 Assist level: Moderate assist (Pt 50 - 74%)   Wheelchair   Type: Manual Max wheelchair distance: 150 Assist Level: Touching or steadying assistance (Pt > 75%)  Cognition Comprehension Comprehension assist level: Understands basic 75 - 89% of the time/ requires cueing 10 - 24% of the time  Expression Expression assist level: Expresses basic 75 - 89% of the time/requires cueing 10 - 24% of the time. Needs helper to occlude trach/needs to repeat words.  Social Interaction Social Interaction assist level: Interacts appropriately 75 - 89% of the time - Needs redirection for appropriate language or to initiate interaction.  Problem Solving Problem solving assist level: Solves basic 50 - 74% of the time/requires cueing 25 - 49% of the time  Memory Memory assist level: Recognizes or recalls 50 - 74% of the time/requires cueing 25 - 49% of the time    Medical Problem List and Plan: 1.  Left inattention and left sided weakness affecting mobility as well as ability to carry out ADL tasks secondary to right MCA infarct.  Cont CIR  WHO/PRAFO ordered  Fluoxetine ordered on 6/4 2.  DVT Prophylaxis/Anticoagulation: Pharmaceutical: Lovenox 3. Pain Management: tylenol prn.  4. Mood: LCSW to follow for evaluation and support.  5. Neuropsych: This patient is capable of making decisions on his own behalf. 6. Skin/Wound Care: Routine pressure relief measures.  7. Fluids/Electrolytes/Nutrition: Monitor I/O.   BMP within normal range on 6/4 8. HTN: Monitor BP bid. Was on Lisinopril/HCTZ 20-25 daily at home  Overall controlled on 6/6 9. Constipation: Cont bowel meds 10 GERD: Continue Protonix.   11. Tobacco/polysubstance  abuse: Encourage cessation. IS while awake to help with cough.  12. Right ICA stenosis: CEA in 4 weeks 13. Leukocytosis  WBCs 10.6 and 6/4  Afebrile  Continue to monitor 14. Sleep  disturbance  Melatonin started on 6/5  Improving  LOS (Days) 3 A FACE TO FACE EVALUATION WAS PERFORMED  [name] [name] [name] [name] 08/08/2017 8:19 AM

## 2017-08-08 NOTE — Progress Notes (Signed)
Occupational Therapy Session Note  Patient Details  Name: [name] [name] MRN: [id number] Date of Birth: 02/28/1966  Today's Date: 08/08/2017 OT Individual Time: 1400-1433 OT Individual Time Calculation (min): 33 min    Short Term Goals: Week 1:  OT Short Term Goal 1 (Week 1): Pt will complete UB dressing with supervision and min instructional cueing for donning pullover shirt.  OT Short Term Goal 2 (Week 1): Pt will complete LB bathing with supervision sit to stand.  OT Short Term Goal 3 (Week 1): Pt will complete toilet transfers with min guard assist using the RW to the elevated toilet. OT Short Term Goal 4 (Week 1): Pt will complete walk-in shower transfers with min guard assist using the RW to the tub bench. OT Short Term Goal 5 (Week 1): Pt will use the LUE as an gross assist for holding items to be opened with no more than mod instructional cueing and min assist.   Skilled Therapeutic Interventions/Progress Updates:    [name] present for session and went with pt and therapist down to the therapy gym.  Had pt transfer onto the mat in quadriped with mod assist.  Pt needed mod demonstrational cueing for sequencing and to slow down secondary to impulsivity.  Had him complete weightbearing task with the LUE positioned on the mat while the RUE reached for target.  Max facilitation needed at the left shoulder and elbow to support the LUE during weightbearing.  Pt overall needing max instructional cueing during tasks to maintain selective attention secondary to internal distractions.  He transitioned to sitting where he focused on maintaining upright sitting posture while working on small controlled movements of shoulder flexion with the LUE on a tilted stool.  Pt was able to demonstrate the ability to push the stool forward, but demonstrated decreased ability to maintain selective attention to the task.  Once completed, pt transferred back to the wheelchair with min assist stand pivot and was  taken back to the room.  His sister was present and therapist discussed with her the current problems related to selfcare independence and the need for pt to have 24 hour supervision at discharge.  She is going to work on getting that arranged.    Therapy Documentation Precautions:  Precautions Precautions: Fall Precaution Comments: L inattention, L hemiparesis Restrictions Weight Bearing Restrictions: No  Pain: Pain Assessment Pain Scale: Faces Pain Score: 0-No pain Faces Pain Scale: Hurts a little bit Pain Type: Acute pain Pain Location: Wrist Pain Orientation: Left Pain Descriptors / Indicators: Discomfort Pain Onset: With Activity Pain Intervention(s): Repositioned ADL: See Function Navigator for Current Functional Status.   Therapy/Group: Individual Therapy  Makar Slatter OTR/L 08/08/2017, 4:14 PM

## 2017-08-08 NOTE — Progress Notes (Signed)
Occupational Therapy Session Note  Patient Details  Name: [name] [name] MRN: [id number] Date of Birth: 10/18/1965  Today's Date: 08/08/2017 OT Individual Time: [id number] OT Individual Time Calculation (min): 54 min    Short Term Goals: Week 1:  OT Short Term Goal 1 (Week 1): Pt will complete UB dressing with supervision and min instructional cueing for donning pullover shirt.  OT Short Term Goal 2 (Week 1): Pt will complete LB bathing with supervision sit to stand.  OT Short Term Goal 3 (Week 1): Pt will complete toilet transfers with min guard assist using the RW to the elevated toilet. OT Short Term Goal 4 (Week 1): Pt will complete walk-in shower transfers with min guard assist using the RW to the tub bench. OT Short Term Goal 5 (Week 1): Pt will use the LUE as an gross assist for holding items to be opened with no more than mod instructional cueing and min assist.   Skilled Therapeutic Interventions/Progress Updates:    Pt completed ambulation from EOB to the shower bench with mod assist hand held.  Once on the seat he completed all bathing with overall min assist but needed mod instructional cueing for initiation and completion secondary to decreased sustained attention.  He needed max assist for washing the RUE with the LUE as well.  Pt transferred out to the wheelchair for dressing with mod demonstrational cueing and min assist for donning pullover shirt and min assist with min instructional cueing for donning pants and gripper socks.  Finished session with call button and phone in reach and safety belt in place.  Chair alarm also in place.  Noted blister under pts left axilla region.  Both nursing and PA made aware.  Pt also reporting history of craving for cigaret so informed PA so nicotine patch could be used of possible.    Therapy Documentation Precautions:  Precautions Precautions: Fall Precaution Comments: L inattention, L hemiparesis Restrictions Weight Bearing Restrictions:  No  Pain: Pain Assessment Pain Scale: 0-10 Pain Score: 0-No pain ADL: See Function Navigator for Current Functional Status.   Therapy/Group: Individual Therapy  Heba Ige OTR/L 08/08/2017, 12:20 PM

## 2017-08-08 NOTE — Patient Care Conference (Signed)
Inpatient RehabilitationTeam Conference and Plan of Care Update Date: 08/07/2017   Time: 2:00 PM    Patient Name: [name] [name]      Medical Record Number: [id number]  Date of Birth: 02/13/1966 Sex: Male         Room/Bed: 4M03C/4M03C-01 Payor Info: Payor: [employer] / Plan: [employer] MANAGED / Product Type: *No Product type* /    Admitting Diagnosis: R CVA  Admit Date/Time:  08/05/2017  3:16 PM Admission Comments: No comment available   Primary Diagnosis:  <principal problem not specified> Principal Problem: <principal problem not specified>  Patient Active Problem List   Diagnosis Date Noted  . Sleep disturbance   . Leukocytosis   . Internal carotid artery stenosis, right   . Cerebral edema (HCC) 08/05/2017  . Hyperlipidemia 08/05/2017  . Tobacco use disorder 08/05/2017  . Family history of stroke 08/05/2017  . Acute ischemic right MCA stroke (HCC) 08/05/2017  . Cerebral infarction due to embolism of right middle cerebral artery (HCC)   . Benign essential HTN   . Slow transit constipation   . Gastroesophageal reflux disease   . Carotid stenosis 08/04/2017  . Cocaine abuse (HCC) 08/04/2017  . Acute encephalopathy   . Essential hypertension   . 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 Staghorn calculus 08/24/2013  . Hemorrhoids, internal, with bleeding and Grade 2 prolapse 08/24/2013    Expected Discharge Date: Expected Discharge Date: 08/21/17  Team Members Present: Physician leading conference: Dr. Maryla Morrow Social Worker Present: Dossie Der, LCSW Nurse Present: Other (comment)(Denise Lloyd-RN) PT Present: Alyson Reedy, PT OT Present: Perrin Maltese, OT SLP Present: Jackalyn Lombard, SLP     Current Status/Progress Goal Weekly Team Focus  Medical   Left inattention and left sided weakness affecting mobility as well as ability to carry out ADL tasks secondary to right MCA infarct  Improve mobility, sleep, BP, WBCs  See above    Bowel/Bladder   continent of bowel and bladder LBM 6-3  Remain continent of bowel and bladder, maintain regular bowel pattern  Assistk with tolieting needs prn    Swallow/Nutrition/ Hydration             ADL's   Pt completed UB selfcare with min to mod assist.  Mod assist as well for LB selfcare and for functional transfers.  Left inattention with left visual field cut.  Brunnstrum stage III in the left arm and stage I in the left hand.  supervision overall  selfcare retraining, balance retraining, transfer retraining, neuromuscular re-education, therapeutic exercise, pt/family education   Mobility   S bed mobility, modA transfers, gait and stairs  S overall  L attention, L NMR, activity tolerance, transfer/gait training   Communication   mild dysarthria  mod I   education and carryover of compensatory strategies    Safety/Cognition/ Behavioral Observations  mod assist   supervision   left attention, problem solving, safety awareness, memory   Pain   no c/o pain  pain =0  Assess pain q shift and prn    Skin   no skin issues  no new skin issues  Assess skin q shift and prn      *See Care Plan and progress notes for long and short-term goals.     Barriers to Discharge  Current Status/Progress Possible Resolutions Date Resolved   Physician    Medical stability;Decreased caregiver support;Lack of/limited family support     See above  Therapies, improve sleep, optimize BP meds,  follow labs      Nursing                  PT  Home environment access/layout;Decreased caregiver support                 OT Decreased caregiver support  lives alone and sister works during the day             SLP Decreased caregiver support              SW Decreased caregiver support Does not have 24 hr care            Discharge Planning/Teaching Needs:  Sister is trying to come up with a 24 hr care plan, due to all family members work. Checking with uncle and aunt. Aware pt will need atleast  supervision at discharge.      Team Discussion:  Goals supervision level. L-inattention and activity tolerance poor, impulsive. MD started sleep med due to not sleeping here. L-field cut and has delayed processing. MD adjusting BP meds. Cont B & B. Will need 24 hr supervision-min assist at discharge.  Revisions to Treatment Plan:  DC 6/19    Continued Need for Acute Rehabilitation Level of Care: The patient requires daily medical management by a physician with specialized training in physical medicine and rehabilitation for the following conditions: Daily direction of a multidisciplinary physical rehabilitation program to ensure safe treatment while eliciting the highest outcome that is of practical value to the patient.: Yes Daily medical management of patient stability for increased activity during participation in an intensive rehabilitation regime.: Yes Daily analysis of laboratory values and/or radiology reports with any subsequent need for medication adjustment of medical intervention for : Blood pressure problems;Other;Neurological problems  Lucy ChrisDupree, Jaxsyn Catalfamo G 08/08/2017, 8:27 AM

## 2017-08-08 NOTE — Progress Notes (Signed)
Speech Language Pathology Daily Session Note  Patient Details  Name: [name] [name] MRN: [id number] Date of Birth: 10/14/1965  Today's Date: 08/08/2017 SLP Individual Time: 1105-1200 SLP Individual Time Calculation (min): 55 min  Short Term Goals: Week 1: SLP Short Term Goal 1 (Week 1): Pt will use slow rate, overarticulation, and increased vocal intensity to achieve intelligibility at the conversational level in a noisy, distracting environment.  SLP Short Term Goal 2 (Week 1): Pt will recall daily information with min cues for use of memory compensatory strategies.  SLP Short Term Goal 3 (Week 1): Pt will complete semi-complex tasks with min assist verbal cues for functional problem solving  SLP Short Term Goal 4 (Week 1): Pt will selectively attend to task in a moderately distracting environment for 30 minutes with min verbal cues for redirection.  SLP Short Term Goal 5 (Week 1): Pt will locate items to the left of midline during functional tasks for >75% accuracy with min assist verbal cues.   Skilled Therapeutic Interventions:  Pt was seen for skilled ST targeting cognitive goals.  SLP facilitated the session with medication management tasks to address recall of new information, problem solving, and visual scanning.  Pt needed max assist to recall function of medications when named and max to total assist to organize pills into a twice daily pill box due to left neglect and severe impulsivity leading to decreased task organization.  Discussed recommendations that pt have assistance with managing his medications at discharge with him and his sister who was present at the end of today's session.  SLP updated family with current goals and progress in therapies, reiterating that pt will need 24/7 supervision at discharge.  All questions were answered to their satisfaction at this time.  Continue per current plan of care.    Function:  Eating Eating                 Cognition Comprehension  Comprehension assist level: Understands basic 90% of the time/cues < 10% of the time  Expression   Expression assist level: Expresses basic 75 - 89% of the time/requires cueing 10 - 24% of the time. Needs helper to occlude trach/needs to repeat words.  Social Interaction Social Interaction assist level: Interacts appropriately 50 - 74% of the time - May be physically or verbally inappropriate.  Problem Solving Problem solving assist level: Solves basic 25 - 49% of the time - needs direction more than half the time to initiate, plan or complete simple activities  Memory Memory assist level: Recognizes or recalls 50 - 74% of the time/requires cueing 25 - 49% of the time    Pain Pain Assessment Pain Scale: 0-10 Pain Score: 0-No pain  Therapy/Group: Individual Therapy  Santrice Muzio, Melanee SpryNicole L 08/08/2017, 1:08 PM

## 2017-08-08 NOTE — Plan of Care (Signed)
  Problem: Consults Goal: RH STROKE PATIENT EDUCATION Description See Patient Education module for education specifics  Outcome: Progressing   Problem: RH BOWEL ELIMINATION Goal: RH STG MANAGE BOWEL WITH ASSISTANCE Description STG Manage Bowel with  Mod I Assistance.  Outcome: Progressing Goal: RH STG MANAGE BOWEL W/MEDICATION W/ASSISTANCE Description STG Manage Bowel with Medication with mod I  Assistance.  Outcome: Progressing   Problem: RH SAFETY Goal: RH STG ADHERE TO SAFETY PRECAUTIONS W/ASSISTANCE/DEVICE Description STG Adhere to Safety Precautions With cues/supervision Assistance/Device.  Outcome: Progressing   Problem: RH KNOWLEDGE DEFICIT Goal: RH STG INCREASE KNOWLEDGE OF HYPERTENSION Description Pt will be able to explain medication regimen and diet restrictions to manage hypertension and secondary stroke prevention with cues/reminders/resources  Outcome: Progressing   

## 2017-08-08 NOTE — Progress Notes (Signed)
Physical Therapy Session Note  Patient Details  Name: [name] [name] MRN: [id number] Date of Birth: 11/25/1965  Today's Date: 08/08/2017 PT Individual Time: 0800-0900 PT Individual Time Calculation (min): 60 min   Short Term Goals: Week 1:  PT Short Term Goal 1 (Week 1): Pt will perform bed mobility S from flat bed PT Short Term Goal 2 (Week 1): Pt will transfer w/c <>bed with consistent minA PT Short Term Goal 3 (Week 1): pt wll ambulate x75' with LRAD and minA PT Short Term Goal 4 (Week 1): Pt will ascend/descent 8 steps minA   Skilled Therapeutic Interventions/Progress Updates: Pt received seated in w/c, c/o L shoulder pain as below  reports still tired, agreeable to treatment. SetupA for breakfast with cues for managing setup with one handed technique.W/c propulsion 2x100' with R hemi technique and mod cues for attention to L side of environment, pt frequently reaching for handrails to pull chair, and difficulty coordinating UE/LE for navigation. Standing kinetron weight shifts R/L with tactile cues for L hip/knee extension and repetitive cues for upright trunk. Attempted LLE forced use in static standing however pt transitions back to RLE each time despite cueing. Seated kinetron marching between trials. Stand pivot w/c <>mat table minA. Sitting/sidelying L scapular mobilizations superior/inferior, upward/downward rotation and protraction/retraction. Standing balance with forced use LLE with toe taps to numbered targets and cones, tactile cues for hip/knee extension in stance. Gait x50' with no AD and mod/maxA to facilitate LLE weight bearing and coordination, increased R weight shift to improve L foot clearance during swing. Returned to room w/c propulsion as above; remained seated in w/c, chair alarm and quick release belt intact, all needs in reach.      Therapy Documentation Precautions:  Precautions Precautions: Fall Precaution Comments: L inattention, L hemiparesis Restrictions Weight  Bearing Restrictions: No Pain: Pain Assessment Pain Scale: 0-10 Pain Score: 3 Pain Type: Acute pain Pain Location: Shoulder Pain Orientation: Left Pain Descriptors / Indicators: Aching Pain Intervention(s): Medication (See eMAR)   See Function Navigator for Current Functional Status.   Therapy/Group: Individual Therapy  Vista Lawmanlizabeth J Tygielski 08/08/2017, 8:59 AM

## 2017-08-08 NOTE — Progress Notes (Signed)
Social Work Patient ID: [name] [name], male   DOB: [birth year], 52 y.o.   MRN: [id number]  Met with pt and sister-Teresa who was here to visit to discuss team conference goals supervision-min assist level and target discharge 6/19. Helene Kelp reports they really have no one to [provide this level of care. Only people who can check on him. He will not be safe to be home alone at discharge. She has medical questions for Pam-PA who has ben made aware and will come by to see sister. Discussed it is not likely pt's insurance will cover NH but can try. They do know the [facility] so would like him to go there. Will see about coverage. Pt reports: " I don't want to go anywhere but home unless it is for a short time only. Pt does not really recognize all of his deficits, will work on this while he is here.

## 2017-08-08 NOTE — Progress Notes (Signed)
Patient with fluctuant area left axilla--cystic appearing and tender to touch. Will order moist warm compresses to help with drainage and order Keflex tid.

## 2017-08-09 ENCOUNTER — Inpatient Hospital Stay (HOSPITAL_COMMUNITY): Payer: Managed Care, Other (non HMO) | Admitting: Physical Therapy

## 2017-08-09 ENCOUNTER — Inpatient Hospital Stay (HOSPITAL_COMMUNITY): Payer: Managed Care, Other (non HMO) | Admitting: Occupational Therapy

## 2017-08-09 ENCOUNTER — Inpatient Hospital Stay (HOSPITAL_COMMUNITY): Payer: Managed Care, Other (non HMO) | Admitting: Speech Pathology

## 2017-08-09 MED ORDER — CEPHALEXIN 250 MG PO CAPS
250.0000 mg | ORAL_CAPSULE | Freq: Three times a day (TID) | ORAL | Status: DC
  Administered 2017-08-09 – 2017-08-10 (×3): 250 mg via ORAL
  Filled 2017-08-09 (×3): qty 1

## 2017-08-09 MED ORDER — MELATONIN 3 MG PO TABS
3.0000 mg | ORAL_TABLET | Freq: Every day | ORAL | Status: DC
  Administered 2017-08-09: 3 mg via ORAL
  Filled 2017-08-09 (×2): qty 1

## 2017-08-09 NOTE — Plan of Care (Signed)
  Problem: Consults Goal: RH STROKE PATIENT EDUCATION Description See Patient Education module for education specifics  Outcome: Progressing   Problem: RH BOWEL ELIMINATION Goal: RH STG MANAGE BOWEL WITH ASSISTANCE Description STG Manage Bowel with  Mod I Assistance.  Outcome: Progressing Goal: RH STG MANAGE BOWEL W/MEDICATION W/ASSISTANCE Description STG Manage Bowel with Medication with mod I  Assistance.  Outcome: Progressing   Problem: RH SAFETY Goal: RH STG ADHERE TO SAFETY PRECAUTIONS W/ASSISTANCE/DEVICE Description STG Adhere to Safety Precautions With cues/supervision Assistance/Device.  Outcome: Progressing   Problem: RH KNOWLEDGE DEFICIT Goal: RH STG INCREASE KNOWLEDGE OF HYPERTENSION Description Pt will be able to explain medication regimen and diet restrictions to manage hypertension and secondary stroke prevention with cues/reminders/resources  Outcome: Progressing   Problem: RH Vision Goal: RH LTG Vision (Specify) Outcome: Progressing

## 2017-08-09 NOTE — Progress Notes (Signed)
Speech Language Pathology Daily Session Note  Patient Details  Name: [name] [name] MRN: [id number] Date of Birth: 01/20/1966  Today's Date: 08/09/2017 SLP Individual Time: 4098-11910735-0830 SLP Individual Time Calculation (min): 55 min  Short Term Goals: Week 1: SLP Short Term Goal 1 (Week 1): Pt will use slow rate, overarticulation, and increased vocal intensity to achieve intelligibility at the conversational level in a noisy, distracting environment.  SLP Short Term Goal 2 (Week 1): Pt will recall daily information with min cues for use of memory compensatory strategies.  SLP Short Term Goal 3 (Week 1): Pt will complete semi-complex tasks with min assist verbal cues for functional problem solving  SLP Short Term Goal 4 (Week 1): Pt will selectively attend to task in a moderately distracting environment for 30 minutes with min verbal cues for redirection.  SLP Short Term Goal 5 (Week 1): Pt will locate items to the left of midline during functional tasks for >75% accuracy with min assist verbal cues.   Skilled Therapeutic Interventions:  Pt was seen for skilled ST targeting cognitive goals.  Pt sitting at edge of bed with nurse tech present assisting in tray set up.  During meal and morning med administration, pt needed min-mod assist verbal cues to locate items on the left side of his meal tray.  After meal, I encouraged pt to try to use the bathroom since he reported that he hadn't yet gone this morning; however, attempt was unsuccessful.  SLP facilitated the session with a pattern replication task using peg board to address attention to task and visual scanning to the left of midline.  Pt selectively attended to task in a moderately distracting environment for ~20 minutes with min verbal cues for redirection.  He needed mod-max assist to identify discrepancies between patterns due to left inattention and decreased attention to detail.  Pt was returned to bed at the end of today's therapy session in the  hopes of napping for a few minutes before his next scheduled therapy session.  Pt left in bed with call bell within reach and bed alarm set.  Continue per current plan of care.    Function:  Eating Eating   Modified Consistency Diet: No Eating Assist Level: Set up assist for   Eating Set Up Assist For: Opening containers;Cutting food       Cognition Comprehension Comprehension assist level: Follows basic conversation/direction with extra time/assistive device  Expression   Expression assist level: Expresses basic 90% of the time/requires cueing < 10% of the time.  Social Interaction Social Interaction assist level: Interacts appropriately 75 - 89% of the time - Needs redirection for appropriate language or to initiate interaction.  Problem Solving Problem solving assist level: Solves basic 50 - 74% of the time/requires cueing 25 - 49% of the time  Memory Memory assist level: Recognizes or recalls 75 - 89% of the time/requires cueing 10 - 24% of the time    Pain Pain Assessment Pain Scale: 0-10 Pain Score: 0-No pain  Therapy/Group: Individual Therapy  Felise Georgia, Melanee SpryNicole L 08/09/2017, 8:34 AM

## 2017-08-09 NOTE — Progress Notes (Signed)
Did not wake pt for 0600 am meds or vital signs. Pt only had three hours of sleep.

## 2017-08-09 NOTE — Progress Notes (Signed)
[place] PHYSICAL MEDICINE & REHABILITATION     PROGRESS NOTE  Subjective/Complaints:  Patient seen lying in bed this morning. He states he did not sleep well overnight, from sleep chart.Yesterday, patient was noted to have fluctuant area under his left axilla. When asked about this patient, patient states that he hasa little bit of shoulder discomfort from the way he slept overnight.  ROS: Denies CP, SOB, nausea, vomiting, diarrhea.  Objective: Vital Signs: Blood pressure (!) 141/94, pulse 80, temperature 98.9 F (37.2 C), temperature source Oral, resp. rate 19, height 5\' 5"  (1.651 m), weight 72.2 kg (159 lb 2.8 oz), SpO2 97 %. No results found. No results for input(s): WBC, HGB, HCT, PLT in the last 72 hours. No results for input(s): NA, K, CL, GLUCOSE, BUN, CREATININE, CALCIUM in the last 72 hours.  Invalid input(s): CO CBG (last 3)  No results for input(s): GLUCAP in the last 72 hours.  Wt Readings from Last 3 Encounters:  08/07/17 72.2 kg (159 lb 2.8 oz)  07/27/17 76.3 kg (168 lb 3.2 oz)  09/07/13 72.2 kg (159 lb 2 oz)    Physical Exam:  BP (!) 141/94 (BP Location: Right Arm)   Pulse 80   Temp 98.9 F (37.2 C) (Oral)   Resp 19   Ht 5\' 5"  (1.651 m)   Wt 72.2 kg (159 lb 2.8 oz)   SpO2 97%   BMI 26.49 kg/m  Constitutional: He appears well-developed and well-nourished.  HENT: Normocephalic and atraumatic.  Eyes: EOM are normal. No discharge.  Cardiovascular: RRR. No JVD. Respiratory: Effort normal and breath sounds normal.  GI: Bowel sounds are normal. Nondistended. Musculoskeletal: No edema or tenderness in extremities  Neurological: He is alert and oriented.Marland Kitchen.  He was able to follow simple motor commands.  Left facial weakness with mild dysarthria.  Left upper extremity: Shoulder abduction 1/5, elbow flexion/extension 4-/5, wrist extension, hand grip 0/5  Left lower extremity: 4/5 proximal to distal Emerging tone Skin: Skin is warm and dry.  Psychiatric: His  affect is blunt.   Assessment/Plan: 1. Functional deficits secondary to right MCA infarct which require 3+ hours per day of interdisciplinary therapy in a comprehensive inpatient rehab setting. Physiatrist is providing close team supervision and 24 hour management of active medical problems listed below. Physiatrist and rehab team continue to assess barriers to discharge/monitor patient progress toward functional and medical goals.  Function:  Bathing Bathing position   Position: Shower  Bathing parts Body parts bathed by patient: Chest, Abdomen, Left arm, Front perineal area, Buttocks, Right upper leg, Left upper leg, Right lower leg Body parts bathed by helper: Left lower leg, Back, Right arm  Bathing assist        Upper Body Dressing/Undressing Upper body dressing   What is the patient wearing?: Pull over shirt/dress     Pull over shirt/dress - Perfomed by patient: Put head through opening, Thread/unthread left sleeve, Thread/unthread right sleeve Pull over shirt/dress - Perfomed by helper: Pull shirt over trunk        Upper body assist        Lower Body Dressing/Undressing Lower body dressing   What is the patient wearing?: Pants, Non-skid slipper socks     Pants- Performed by patient: Thread/unthread right pants leg, Thread/unthread left pants leg Pants- Performed by helper: Pull pants up/down Non-skid slipper socks- Performed by patient: Don/doff right sock, Don/doff left sock Non-skid slipper socks- Performed by helper: Don/doff left sock  Lower body assist Assist for lower body dressing: Touching or steadying assistance (Pt > 75%)      Toileting Toileting   Toileting steps completed by patient: Adjust clothing prior to toileting Toileting steps completed by helper: Performs perineal hygiene, Adjust clothing after toileting    Toileting assist Assist level: Touching or steadying assistance (Pt.75%)   Transfers Chair/bed transfer    Chair/bed transfer method: Stand pivot Chair/bed transfer assist level: Touching or steadying assistance (Pt > 75%) Chair/bed transfer assistive device: Armrests     Locomotion Ambulation     Max distance: 50 Assist level: Maximal assist (Pt 25 - 49%)   Wheelchair   Type: Manual Max wheelchair distance: 100 Assist Level: Touching or steadying assistance (Pt > 75%)  Cognition Comprehension Comprehension assist level: Understands basic 90% of the time/cues < 10% of the time  Expression Expression assist level: Expresses basic 75 - 89% of the time/requires cueing 10 - 24% of the time. Needs helper to occlude trach/needs to repeat words.  Social Interaction Social Interaction assist level: Interacts appropriately 50 - 74% of the time - May be physically or verbally inappropriate.  Problem Solving Problem solving assist level: Solves basic 25 - 49% of the time - needs direction more than half the time to initiate, plan or complete simple activities  Memory Memory assist level: Recognizes or recalls 50 - 74% of the time/requires cueing 25 - 49% of the time    Medical Problem List and Plan: 1.  Left inattention and left sided weakness affecting mobility as well as ability to carry out ADL tasks secondary to right MCA infarct.  Cont CIR  WHO/PRAFO ordered  Fluoxetine ordered on 6/4 2.  DVT Prophylaxis/Anticoagulation: Pharmaceutical: Lovenox 3. Pain Management: tylenol prn.  4. Mood: LCSW to follow for evaluation and support.  5. Neuropsych: This patient is capable of making decisions on his own behalf. 6. Skin/Wound Care: Routine pressure relief measures.   Keflex started on 6/6 fluctuant area under left axilla 7. Fluids/Electrolytes/Nutrition: Monitor I/O.   BMP within normal range on 6/4 8. HTN: Monitor BP bid. Was on Lisinopril/HCTZ 20-25 daily at home  Overall controlled on 6/7 9. Constipation: Cont bowel meds 10 GERD: Continue Protonix.   11. Tobacco/polysubstance  abuse:  Encourage cessation. IS while awake to help with cough.  12. Right ICA stenosis: CEA in 4 weeks 13. Leukocytosis  WBCs 10.6 and 6/4  Abs ordered for Monday  Afebrile  Continue to monitor 14. Sleep disturbance  Melatonin started on 6/5 increased on 6/7  LOS (Days) 4 A FACE TO FACE EVALUATION WAS PERFORMED  Ankit [name] Juba 08/09/2017 8:07 AM

## 2017-08-09 NOTE — Progress Notes (Signed)
Physical Therapy Session Note  Patient Details  Name: [name] [name] MRN: [id number] Date of Birth: 04/13/1965  Today's Date: 08/09/2017 PT Individual Time: [id number] PT Individual Time Calculation (min): 54 min   Short Term Goals: Week 1:  PT Short Term Goal 1 (Week 1): Pt will perform bed mobility S from flat bed PT Short Term Goal 2 (Week 1): Pt will transfer w/c <>bed with consistent minA PT Short Term Goal 3 (Week 1): pt wll ambulate x75' with LRAD and minA PT Short Term Goal 4 (Week 1): Pt will ascend/descent 8 steps minA   Skilled Therapeutic Interventions/Progress Updates:    Pt in w/c agreeable to treatment, states he is "tired" from not sleeping well last night.  Pt performs squat pivot transfers throughout session to Lt and Rt with min A, cues for safety and UE placement.  Standing balance with tapping and step ups with manual facilitation for posture, trunk control and Lt LE alignment.  Gait training without RW multiple attempts of up to 30' with manual facilitation for trunk and postural control, occasional assist for Lt LE placement as pt tends to adduct Lt LE when fatigued.  nustep for UE and LE strength and endurance x 6 minutes level 3 with mod cues for attention to task, pt unable to recall PTs instructions to perform nustep for 5 minutes despite cuing throughout task.  Pt able to doff shoes seated edge of bed with supervision, supervision for sit to supine. Pt left in bed with needs at hand, alarm set.  Therapy Documentation Precautions:  Precautions Precautions: Fall Precaution Comments: L inattention, L hemiparesis Restrictions Weight Bearing Restrictions: No Pain: Pain Assessment Pain Scale: 0-10 Pain Score: 0-No pain   Therapy/Group: Individual Therapy  [name] [name] 08/09/2017, 11:27 AM

## 2017-08-09 NOTE — Progress Notes (Signed)
Repositioned pt and he continues to be awake

## 2017-08-09 NOTE — Progress Notes (Signed)
Occupational Therapy Session Note  Patient Details  Name: [name] [name] MRN: [id number] Date of Birth: 10/30/1965  Today's Date: 08/09/2017 OT Individual Time: 0902-1000 OT Individual Time Calculation (min): 58 min    Short Term Goals: Week 1:  OT Short Term Goal 1 (Week 1): Pt will complete UB dressing with supervision and min instructional cueing for donning pullover shirt.  OT Short Term Goal 2 (Week 1): Pt will complete LB bathing with supervision sit to stand.  OT Short Term Goal 3 (Week 1): Pt will complete toilet transfers with min guard assist using the RW to the elevated toilet. OT Short Term Goal 4 (Week 1): Pt will complete walk-in shower transfers with min guard assist using the RW to the tub bench. OT Short Term Goal 5 (Week 1): Pt will use the LUE as an gross assist for holding items to be opened with no more than mod instructional cueing and min assist.   Skilled Therapeutic Interventions/Progress Updates:    Pt completed shower and dressing during session.  He was able to transfer from supine to sit EOB with supervision and mod instructional cueing for technique.  He then ambulated to the shower with min assist and no assistive device for bathing.  Min assist for removal of dirty clothing as well.  With showering pt needed mod instructional cueing to sequence as he would just sit there and perseverate on running the water on his legs.  Once cued, he could wash with overall min assist.  Max assist with max instructional cueing to involve the LUE in opening soap or other items or for washing the RUE.  Mod demonstrational cueing for dressing tasks with mod assist for following hemidressing techniques.  Finished session with work on [employer] of the LUE on the bedside table for internal and external rotation as well as AAROM for elbow flexion and shoulder flexion.  He demonstrated greater active movement with internal rotation.  Pt left in wheelchair with call button and phone in reach.   Chair alarm and safety belt also in place.    Therapy Documentation Precautions:  Precautions Precautions: Fall Precaution Comments: L inattention, L hemiparesis Restrictions Weight Bearing Restrictions: No  Pain: Pain Assessment Pain Scale: 0-10 Pain Score: 0-No pain ADL: See Function Navigator for Current Functional Status.   Therapy/Group: Individual Therapy  Melvia Matousek OTR/L 08/09/2017, 12:30 PM

## 2017-08-09 NOTE — Progress Notes (Signed)
Pt continues to sleep.

## 2017-08-09 NOTE — Progress Notes (Signed)
Occupational Therapy Session Note  Patient Details  Name: [name] [name] MRN: [id number] Date of Birth: 11/06/1965  Today's Date: 08/09/2017 OT Individual Time: 1500-1530 OT Individual Time Calculation (min): 30 min    Short Term Goals: Week 1:  OT Short Term Goal 1 (Week 1): Pt will complete UB dressing with supervision and min instructional cueing for donning pullover shirt.  OT Short Term Goal 2 (Week 1): Pt will complete LB bathing with supervision sit to stand.  OT Short Term Goal 3 (Week 1): Pt will complete toilet transfers with min guard assist using the RW to the elevated toilet. OT Short Term Goal 4 (Week 1): Pt will complete walk-in shower transfers with min guard assist using the RW to the tub bench. OT Short Term Goal 5 (Week 1): Pt will use the LUE as an gross assist for holding items to be opened with no more than mod instructional cueing and min assist.   Skilled Therapeutic Interventions/Progress Updates:    Pt completed transfer from supine to sit with supervision.  He requested need to use the urinal, so had him stand to complete with min assist.  Once completed, he transferred stand pivot to the wheelchair at the same level.  Took pt down to the therapy gym where he transferred to the mat.  Placed LUE in weightbearing while having pt work on reaching across his body with the RUE to target.  Progressed to working on partial left lean to sitting to work on LUE weightbearing.  He needed max assist to transition from side lean to sitting with emphasis on activation of the LUE active elbow extension.  He needed mod demonstrational cueing for sustained attention to the LUE when attempting activation.  He then progressed to working on activation of internal and external rotation using a ball on top of a board.  He was able to exhibit some active movement in both directions.  He needs mod demonstrational cueing to avoid trunk compensations with external rotations.  Finished session with  return to the room and pt transferring back to the bed.  Bed alarm in place with call button and phone in reach as well.     Therapy Documentation Precautions:  Precautions Precautions: Fall Precaution Comments: L inattention, L hemiparesis Restrictions Weight Bearing Restrictions: No  Pain: Pain Assessment Pain Scale: 0-10 Pain Score: 0-No pain ADL: See Function Navigator for Current Functional Status.   Therapy/Group: Individual Therapy  Elisha Cooksey OTR/L 08/09/2017, 4:28 PM

## 2017-08-09 NOTE — Progress Notes (Signed)
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 I never went to sleep, passing out is what I did."

## 2017-08-09 NOTE — Progress Notes (Signed)
Pt sleeping. 

## 2017-08-10 ENCOUNTER — Inpatient Hospital Stay (HOSPITAL_COMMUNITY): Payer: Managed Care, Other (non HMO) | Admitting: Physical Therapy

## 2017-08-10 ENCOUNTER — Inpatient Hospital Stay (HOSPITAL_COMMUNITY): Payer: Managed Care, Other (non HMO)

## 2017-08-10 ENCOUNTER — Inpatient Hospital Stay (HOSPITAL_COMMUNITY): Payer: Managed Care, Other (non HMO) | Admitting: Speech Pathology

## 2017-08-10 DIAGNOSIS — D72823 Leukemoid reaction: Secondary | ICD-10-CM

## 2017-08-10 MED ORDER — MELATONIN 3 MG PO TABS
3.0000 mg | ORAL_TABLET | Freq: Every day | ORAL | Status: DC
  Administered 2017-08-10 – 2017-08-20 (×11): 3 mg via ORAL
  Filled 2017-08-10 (×11): qty 1

## 2017-08-10 MED ORDER — TRAZODONE HCL 50 MG PO TABS
50.0000 mg | ORAL_TABLET | Freq: Every day | ORAL | Status: DC
  Administered 2017-08-10 – 2017-08-20 (×11): 50 mg via ORAL
  Filled 2017-08-10 (×11): qty 1

## 2017-08-10 MED ORDER — FLUOXETINE HCL 10 MG PO CAPS
10.0000 mg | ORAL_CAPSULE | Freq: Every day | ORAL | Status: DC
  Administered 2017-08-10 – 2017-08-20 (×11): 10 mg via ORAL
  Filled 2017-08-10 (×11): qty 1

## 2017-08-10 MED ORDER — CEPHALEXIN 250 MG PO CAPS
250.0000 mg | ORAL_CAPSULE | Freq: Three times a day (TID) | ORAL | Status: DC
  Administered 2017-08-10 – 2017-08-13 (×7): 250 mg via ORAL
  Filled 2017-08-10 (×7): qty 1

## 2017-08-10 MED ORDER — SENNOSIDES-DOCUSATE SODIUM 8.6-50 MG PO TABS
2.0000 | ORAL_TABLET | Freq: Every day | ORAL | Status: DC
  Administered 2017-08-10 – 2017-08-20 (×11): 2 via ORAL
  Filled 2017-08-10 (×11): qty 2

## 2017-08-10 NOTE — Progress Notes (Addendum)
Speech Language Pathology Daily Session Note  Patient Details  Name: [name] [name] MRN: [id number] Date of Birth: 12/18/1965  Today's Date: 08/10/2017 SLP Individual Time: 1025-1120 SLP Individual Time Calculation (min): 55 min  Short Term Goals: Week 1: SLP Short Term Goal 1 (Week 1): Pt will use slow rate, overarticulation, and increased vocal intensity to achieve intelligibility at the conversational level in a noisy, distracting environment.  SLP Short Term Goal 2 (Week 1): Pt will recall daily information with min cues for use of memory compensatory strategies.  SLP Short Term Goal 3 (Week 1): Pt will complete semi-complex tasks with min assist verbal cues for functional problem solving  SLP Short Term Goal 4 (Week 1): Pt will selectively attend to task in a moderately distracting environment for 30 minutes with min verbal cues for redirection.  SLP Short Term Goal 5 (Week 1): Pt will locate items to the left of midline during functional tasks for >75% accuracy with min assist verbal cues.   Skilled Therapeutic Interventions: Skilled treatment session focused on cognitive and speech goals. Upon arrival, patient reported he was lethargic due to lack of sleep but agreeable to participate in session. Patient required Max A verbal cues for problem solving and to self-monitor and correct errors due to impulsivity with a basic money management task. Patient also participated in a verbal description task at the sentence level in a moderately noisy environment and was ~90% intelligible with supervision verbal cues for use of an increased vocal intensity.  Patient left upright in bed with alarm on and all needs within reach. Continue with current plan of care.      Function:  Cognition Comprehension Comprehension assist level: Follows basic conversation/direction with extra time/assistive device  Expression   Expression assist level: Expresses basic 90% of the time/requires cueing < 10% of the  time.  Social Interaction Social Interaction assist level: Interacts appropriately 75 - 89% of the time - Needs redirection for appropriate language or to initiate interaction.  Problem Solving Problem solving assist level: Solves basic 50 - 74% of the time/requires cueing 25 - 49% of the time  Memory Memory assist level: Recognizes or recalls 75 - 89% of the time/requires cueing 10 - 24% of the time    Pain Pain Assessment Pain Scale: 0-10 Pain Score: 3  Pain Type: Acute pain Pain Location: Back Pain Orientation: Lower Pain Descriptors / Indicators: Aching Pain Onset: On-going Pain Intervention(s): Repositioned;Emotional support  Therapy/Group: Individual Therapy  Loyce Flaming 08/10/2017, 12:06 PM

## 2017-08-10 NOTE — Progress Notes (Signed)
[facility] PHYSICAL MEDICINE & REHABILITATION     PROGRESS NOTE  Subjective/Complaints:  Patient denies complaints.  Uneventful night.  ROS: Patient denies fever, rash, sore throat, blurred vision, nausea, vomiting, diarrhea, cough, shortness of breath or chest pain, joint or back pain, headache, or mood change.   Objective: Vital Signs: Blood pressure 112/86, pulse 69, temperature (!) 97.5 F (36.4 C), temperature source Oral, resp. rate 18, height 5\' 5"  (1.651 m), weight 72.2 kg (159 lb 2.8 oz), SpO2 97 %. No results found. No results for input(s): WBC, HGB, HCT, PLT in the last 72 hours. No results for input(s): NA, K, CL, GLUCOSE, BUN, CREATININE, CALCIUM in the last 72 hours.  Invalid input(s): CO CBG (last 3)  No results for input(s): GLUCAP in the last 72 hours.  Wt Readings from Last 3 Encounters:  08/07/17 72.2 kg (159 lb 2.8 oz)  07/27/17 76.3 kg (168 lb 3.2 oz)  09/07/13 72.2 kg (159 lb 2 oz)    Physical Exam:  BP 112/86 (BP Location: Right Arm)   Pulse 69   Temp (!) 97.5 F (36.4 C) (Oral)   Resp 18   Ht 5\' 5"  (1.651 m)   Wt 72.2 kg (159 lb 2.8 oz)   SpO2 97%   BMI 26.49 kg/m  Constitutional: No distress . Vital signs reviewed. HEENT: EOMI, oral membranes moist Neck: supple Cardiovascular: RRR without murmur. No JVD    Respiratory: CTA Bilaterally without wheezes or rales. Normal effort    GI: BS +, non-tender, non-distended  Musculoskeletal: No edema or tenderness in extremities  Neurological: He is alert and oriented.Marland Kitchen  He was able to follow simple motor commands.  Left facial weakness with mild dysarthria.  Left upper extremity: Shoulder abduction 1/5, elbow flexion/extension 4-/5, wrist extension, hand grip 0/5 --no change Left lower extremity: 4/5 proximal to distal Emerging tone LUE/LLE Skin: Skin is warm and dry. Left axilla without drainage/redness/?fluctuance  Psychiatric: His affect is blunt.   Assessment/Plan: 1. Functional deficits  secondary to right MCA infarct which require 3+ hours per day of interdisciplinary therapy in a comprehensive inpatient rehab setting. Physiatrist is providing close team supervision and 24 hour management of active medical problems listed below. Physiatrist and rehab team continue to assess barriers to discharge/monitor patient progress toward functional and medical goals.  Function:  Bathing Bathing position   Position: Shower  Bathing parts Body parts bathed by patient: Chest, Abdomen, Left arm, Front perineal area, Buttocks, Right upper leg, Left upper leg, Right lower leg Body parts bathed by helper: Left lower leg, Back, Right arm  Bathing assist        Upper Body Dressing/Undressing Upper body dressing   What is the patient wearing?: Pull over shirt/dress     Pull over shirt/dress - Perfomed by patient: Put head through opening, Thread/unthread right sleeve Pull over shirt/dress - Perfomed by helper: Thread/unthread left sleeve, Pull shirt over trunk        Upper body assist        Lower Body Dressing/Undressing Lower body dressing   What is the patient wearing?: Pants, Socks, Shoes     Pants- Performed by patient: Thread/unthread right pants leg, Pull pants up/down Pants- Performed by helper: Thread/unthread left pants leg Non-skid slipper socks- Performed by patient: Don/doff right sock, Don/doff left sock Non-skid slipper socks- Performed by helper: Don/doff left sock Socks - Performed by patient: Don/doff right sock, Don/doff left sock   Shoes - Performed by patient: Don/doff right shoe, Don/doff left  shoe Shoes - Performed by helper: Fasten right, Fasten left          Lower body assist Assist for lower body dressing: Touching or steadying assistance (Pt > 75%)      Toileting Toileting   Toileting steps completed by patient: Adjust clothing prior to toileting Toileting steps completed by helper: Performs perineal hygiene, Adjust clothing after toileting     Toileting assist Assist level: Touching or steadying assistance (Pt.75%)   Transfers Chair/bed transfer   Chair/bed transfer method: Stand pivot Chair/bed transfer assist level: Touching or steadying assistance (Pt > 75%) Chair/bed transfer assistive device: Armrests     Locomotion Ambulation     Max distance: 10" Assist level: Touching or steadying assistance (Pt > 75%)   Wheelchair   Type: Manual Max wheelchair distance: 100 Assist Level: Touching or steadying assistance (Pt > 75%)  Cognition Comprehension Comprehension assist level: Follows basic conversation/direction with extra time/assistive device  Expression Expression assist level: Expresses basic 90% of the time/requires cueing < 10% of the time.  Social Interaction Social Interaction assist level: Interacts appropriately 75 - 89% of the time - Needs redirection for appropriate language or to initiate interaction.  Problem Solving Problem solving assist level: Solves basic 50 - 74% of the time/requires cueing 25 - 49% of the time  Memory Memory assist level: Recognizes or recalls 75 - 89% of the time/requires cueing 10 - 24% of the time    Medical Problem List and Plan: 1.  Left inattention and left sided weakness affecting mobility as well as ability to carry out ADL tasks secondary to right MCA infarct.  Cont CIR  WHO/PRAFO   Fluoxetine ordered on 6/4 2.  DVT Prophylaxis/Anticoagulation: Pharmaceutical: Lovenox 3. Pain Management: tylenol prn.  4. Mood: LCSW to follow for evaluation and support.  5. Neuropsych: This patient is capable of making decisions on his own behalf. 6. Skin/Wound Care: Routine pressure relief measures.   Continue Keflex (started on 6/6) fluctuant area under left axilla 7. Fluids/Electrolytes/Nutrition: Monitor I/O.   BMP within normal range on 6/4 8. HTN: Monitor BP bid. Was on Lisinopril/HCTZ 20-25 daily at home  Overall controlled on 6/8 9. Constipation: Cont bowel meds 10 GERD:  Continue Protonix.   11. Tobacco/polysubstance  abuse: Encourage cessation. IS while awake to help with cough.  12. Right ICA stenosis: CEA in 4 weeks 13. Leukocytosis  WBCs 10.6 and 6/4  Abs ordered for Monday  Afebrile  Continue to monitor 14. Sleep disturbance  Melatonin started on 6/5 increased on 6/7  LOS (Days) 5 A FACE TO FACE EVALUATION WAS PERFORMED  Ranelle OysterZachary T Malyia Moro 08/10/2017 7:42 AM

## 2017-08-10 NOTE — Progress Notes (Signed)
Occupational Therapy Session Note  Patient Details  Name: [name] [name] MRN: [id number] Date of Birth: [birth year]  Today's Date: 08/10/2017 OT Individual Time: 2811-8867 OT Individual Time Calculation (min): 48 min  and Today's Date: 08/10/2017 OT Missed Time: 12 Minutes Missed Time Reason: Patient fatigue   Short Term Goals: Week 1:  OT Short Term Goal 1 (Week 1): Pt will complete UB dressing with supervision and min instructional cueing for donning pullover shirt.  OT Short Term Goal 2 (Week 1): Pt will complete LB bathing with supervision sit to stand.  OT Short Term Goal 3 (Week 1): Pt will complete toilet transfers with min guard assist using the RW to the elevated toilet. OT Short Term Goal 4 (Week 1): Pt will complete walk-in shower transfers with min guard assist using the RW to the tub bench. OT Short Term Goal 5 (Week 1): Pt will use the LUE as an gross assist for holding items to be opened with no more than mod instructional cueing and min assist.   Skilled Therapeutic Interventions/Progress Updates:    Pt received supine in bed agreeable to therapy with no initial c/o pain. Session focused on L UE NMR and ADL transfers. Pt with flat affect and c/o fatigue d/t poor sleep last night. Pt transferred to w/c with min A and transported down to therapy gym. Pt sat EOB with (S), requiring vc for positioning to maximize balance.   1:1 NMES applied to L bicep and L tricep m grossly to increase spontaneous movement/contraction of L UE and promote meaningful L UE use.   Ratio 1:3 Rate 35 pps Waveform- Asymmetric Ramp 1.0 Pulse 300 Intensity- 19 Duration -   15 min  Report of pain at the beginning of session: 0 Report of pain at the end of session: 0 No adverse reactions after treatment and skin intact.   Pt became increasingly frustrated with L UE flaccidity and requested to end session and return to bed. Emotional support and encouragement provided. Pt was returned supine and left  with bed alarm set and all needs met.   Therapy Documentation Precautions:  Precautions Precautions: Fall Precaution Comments: L inattention, L hemiparesis Restrictions Weight Bearing Restrictions: No General: General OT Amount of Missed Time: 12 Minutes Vital Signs: Therapy Vitals Pulse Rate: 69 Resp: 18 BP: 112/86 Patient Position (if appropriate): Lying Oxygen Therapy SpO2: 97 % O2 Device: Room Air Pain: Pain Assessment Pain Scale: 0-10 Pain Score: 3  Pain Type: Acute pain Pain Location: Back Pain Orientation: Lower Pain Descriptors / Indicators: Aching Pain Onset: On-going Pain Intervention(s): Repositioned;Emotional support  See Function Navigator for Current Functional Status.   Therapy/Group: Individual Therapy  Curtis Sites 08/10/2017, 9:28 AM

## 2017-08-10 NOTE — Progress Notes (Signed)
Physical Therapy Session Note  Patient Details  Name: [name] [name] MRN: [id number] Date of Birth: 01/10/1966  Today's Date: 08/10/2017 PT Individual Time: 2992-4268 PT Individual Time Calculation (min): 75 min   Short Term Goals: Week 1:  PT Short Term Goal 1 (Week 1): Pt will perform bed mobility S from flat bed PT Short Term Goal 2 (Week 1): Pt will transfer w/c <>bed with consistent minA PT Short Term Goal 3 (Week 1): pt wll ambulate x75' with LRAD and minA PT Short Term Goal 4 (Week 1): Pt will ascend/descent 8 steps minA   Skilled Therapeutic Interventions/Progress Updates:   Pt received supine in bed and agreeable to PT. Supine>sit transfer with supervision assist and min cues for use of bed features.   Stand pivot transfers completed x 6 throughout treatment without AD, to and from [facility] with min assist from PT for safety and improved LLE positioning.    Gait training with RW and L hand orthotic 11f +1433f min assist to facilitate weight shift over the RLE to advance the LLE, and improved pelvic rotation. Moderate multimodal cues for increased hip flexion, step length and increased heel contact on the L.   PT instructed in stair training x 4 steps with R UE support and min assist for safety and to facilitate proper weight shifting, as well as cues for step-to gait pattern.   Pt reports need to BM. PT returned pt to room and provided min assist for stand pivot transfer to toilet. Pt unable to void on toilet. Dressing task instructed by PT with clothes provided by sister with min assist and moderate cues for technique due to impulsivity.   Nustep reciprocal movement training with BLE and BUE with L hand orthotic x 10 min with min cues for improved LLE and LUE ROM as well as increased attention to L hip control.   Pt returned to room and performed stand pivot  transfer to bed with min assist. Sit>supine completed with supervision assist and left supine in bed with call bell in reach  and all needs met.       Therapy Documentation Precautions:  Precautions Precautions: Fall Precaution Comments: L inattention, L hemiparesis Restrictions Weight Bearing Restrictions: No   Vital Signs: Therapy Vitals Temp: 98.2 F (36.8 C) Temp Source: Oral Pulse Rate: 85 BP: (!) 144/89 Patient Position (if appropriate): Lying Oxygen Therapy SpO2: 94 % O2 Device: Room Air   See Function Navigator for Current Functional Status.   Therapy/Group: Individual Therapy  AuLorie Phenix/10/2017, 2:24 PM

## 2017-08-11 ENCOUNTER — Inpatient Hospital Stay (HOSPITAL_COMMUNITY): Payer: Managed Care, Other (non HMO) | Admitting: Occupational Therapy

## 2017-08-11 MED ORDER — NICOTINE 14 MG/24HR TD PT24
14.0000 mg | MEDICATED_PATCH | Freq: Every day | TRANSDERMAL | Status: DC
  Administered 2017-08-11 – 2017-08-20 (×10): 14 mg via TRANSDERMAL
  Filled 2017-08-11 (×10): qty 1

## 2017-08-11 NOTE — Progress Notes (Signed)
[facility] PHYSICAL MEDICINE & REHABILITATION     PROGRESS NOTE  Subjective/Complaints:  Overall doing fairly well.  States that he would like his sleeping medicine earlier.  ROS: Patient denies fever, rash, sore throat, blurred vision, nausea, vomiting, diarrhea, cough, shortness of breath or chest pain, joint or back pain, headache, or mood change.    Objective: Vital Signs: Blood pressure 134/81, pulse 67, temperature 97.7 F (36.5 C), temperature source Oral, resp. rate 18, height 5\' 5"  (1.651 m), weight 72.2 kg (159 lb 2.8 oz), SpO2 99 %. No results found. No results for input(s): WBC, HGB, HCT, PLT in the last 72 hours. No results for input(s): NA, K, CL, GLUCOSE, BUN, CREATININE, CALCIUM in the last 72 hours.  Invalid input(s): CO CBG (last 3)  No results for input(s): GLUCAP in the last 72 hours.  Wt Readings from Last 3 Encounters:  08/07/17 72.2 kg (159 lb 2.8 oz)  07/27/17 76.3 kg (168 lb 3.2 oz)  09/07/13 72.2 kg (159 lb 2 oz)    Physical Exam:  BP 134/81 (BP Location: Left Arm)   Pulse 67   Temp 97.7 F (36.5 C) (Oral)   Resp 18   Ht 5\' 5"  (1.651 m)   Wt 72.2 kg (159 lb 2.8 oz)   SpO2 99%   BMI 26.49 kg/m  Constitutional: No distress . Vital signs reviewed. HEENT: EOMI, oral membranes moist Neck: supple Cardiovascular: RRR without murmur. No JVD    Respiratory: CTA Bilaterally without wheezes or rales. Normal effort    GI: BS +, non-tender, non-distended  Musculoskeletal: No edema or tenderness in extremities  Neurological: He is alert and oriented.Marland Kitchen  He was able to follow simple motor commands.  Left facial weakness with mild dysarthria persists.  Left upper extremity: Shoulder abduction 1/5, elbow flexion/extension 4-/5, wrist extension, hand grip 0/5 --stable Left lower extremity: 4/5 proximal to distal Emerging tone LUE/LLE Skin: Skin is warm and dry. Left axilla with decreased fluctuance?  Psychiatric: His affect is blunt.   Assessment/Plan: 1.  Functional deficits secondary to right MCA infarct which require 3+ hours per day of interdisciplinary therapy in a comprehensive inpatient rehab setting. Physiatrist is providing close team supervision and 24 hour management of active medical problems listed below. Physiatrist and rehab team continue to assess barriers to discharge/monitor patient progress toward functional and medical goals.  Function:  Bathing Bathing position   Position: Shower  Bathing parts Body parts bathed by patient: Chest, Abdomen, Left arm, Front perineal area, Buttocks, Right upper leg, Left upper leg, Right lower leg Body parts bathed by helper: Left lower leg, Back, Right arm  Bathing assist        Upper Body Dressing/Undressing Upper body dressing   What is the patient wearing?: Pull over shirt/dress     Pull over shirt/dress - Perfomed by patient: Put head through opening, Thread/unthread right sleeve Pull over shirt/dress - Perfomed by helper: Thread/unthread left sleeve, Pull shirt over trunk        Upper body assist        Lower Body Dressing/Undressing Lower body dressing   What is the patient wearing?: Pants, Socks, Shoes     Pants- Performed by patient: Thread/unthread right pants leg, Pull pants up/down Pants- Performed by helper: Thread/unthread left pants leg Non-skid slipper socks- Performed by patient: Don/doff right sock, Don/doff left sock Non-skid slipper socks- Performed by helper: Don/doff left sock Socks - Performed by patient: Don/doff right sock, Don/doff left sock   Shoes -  Performed by patient: Don/doff right shoe, Don/doff left shoe Shoes - Performed by helper: Fasten right, Fasten left          Lower body assist Assist for lower body dressing: Touching or steadying assistance (Pt > 75%)      Toileting Toileting   Toileting steps completed by patient: Adjust clothing prior to toileting Toileting steps completed by helper: Adjust clothing prior to toileting,  Performs perineal hygiene, Adjust clothing after toileting    Toileting assist Assist level: Touching or steadying assistance (Pt.75%)   Transfers Chair/bed transfer   Chair/bed transfer method: Stand pivot Chair/bed transfer assist level: Touching or steadying assistance (Pt > 75%) Chair/bed transfer assistive device: Armrests     Locomotion Ambulation     Max distance: 110240ft Assist level: Touching or steadying assistance (Pt > 75%)   Wheelchair   Type: Manual Max wheelchair distance: 100 Assist Level: Touching or steadying assistance (Pt > 75%)  Cognition Comprehension Comprehension assist level: Follows basic conversation/direction with no assist  Expression Expression assist level: Expresses basic needs/ideas: With no assist  Social Interaction Social Interaction assist level: Interacts appropriately 90% of the time - Needs monitoring or encouragement for participation or interaction.  Problem Solving Problem solving assist level: Solves basic problems with no assist  Memory Memory assist level: Recognizes or recalls 90% of the time/requires cueing < 10% of the time    Medical Problem List and Plan: 1.  Left inattention and left sided weakness affecting mobility as well as ability to carry out ADL tasks secondary to right MCA infarct.  Cont CIR  WHO/PRAFO   Fluoxetine ordered on 6/4 2.  DVT Prophylaxis/Anticoagulation: Pharmaceutical: Lovenox 3. Pain Management: tylenol prn.  4. Mood: LCSW to follow for evaluation and support.  5. Neuropsych: This patient is capable of making decisions on his own behalf. 6. Skin/Wound Care: Routine pressure relief measures.   Continue Keflex (started on 6/6) fluctuant area under left axilla--?improved 7. Fluids/Electrolytes/Nutrition: Monitor I/O.   BMP within normal range on 6/4 8. HTN: Monitor BP bid. Was on Lisinopril/HCTZ 20-25 daily at home  Overall controlled on 6/9 9. Constipation: Cont bowel meds 10 GERD: Continue Protonix.    11. Tobacco/polysubstance  abuse: Encourage cessation. IS while awake to help with cough.  12. Right ICA stenosis: CEA in 4 weeks 13. Leukocytosis  WBCs 10.6 and 6/4  Abs ordered for Monday  Afebrile  Continue to monitor 14. Sleep disturbance  Melatonin started on 6/5 increased on 6/7  Trazodone/melatonin timing adjusted to-8 PM per patient request  LOS (Days) 6 A FACE TO FACE EVALUATION WAS PERFORMED  Ranelle OysterZachary T Swartz 08/11/2017 7:38 AM

## 2017-08-11 NOTE — Progress Notes (Signed)
Occupational Therapy Session Note  Patient Details  Name: [name] [name] MRN: [id number] Date of Birth: 10/29/1965  Today's Date: 08/11/2017 OT Individual Time: 1105-1200 OT Individual Time Calculation (min): 55 min   Short Term Goals: Week 1:  OT Short Term Goal 1 (Week 1): Pt will complete UB dressing with supervision and min instructional cueing for donning pullover shirt.  OT Short Term Goal 2 (Week 1): Pt will complete LB bathing with supervision sit to stand.  OT Short Term Goal 3 (Week 1): Pt will complete toilet transfers with min guard assist using the RW to the elevated toilet. OT Short Term Goal 4 (Week 1): Pt will complete walk-in shower transfers with min guard assist using the RW to the tub bench. OT Short Term Goal 5 (Week 1): Pt will use the LUE as an gross assist for holding items to be opened with no more than mod instructional cueing and min assist.   Skilled Therapeutic Interventions/Progress Updates:    Pt greeted supine in bed. Agreeable to B/D session. Tx focus on functional transfers, Lt NMR, Lt scanning/attention, sit<stands and adaptive bathing/dressing skills. He ambulated to toilet with Mod A, RW, and cues for technique and attention to L UE. Assist required for placing limb into orthosis also. Pt with continent B+B void while seated and completed hygiene while seated as well. He ambulated short distance to TTB using device with max vcs for safety. Bathing completed sit<stand level with vcs for sequencing and utilizing figure 4. Similar cuing required for LB dressing w/c level afterwards. Placed clothing items on Lt to promote Lt scanning and reaching towards Lt side using R UE. Max cues for orientation of shirt and carryover of hemi techniques. He completed grooming tasks and oral care afterwards at sink. Throughout session, therapist facilitated L UE weightbearing on appropriate surfaces or DME, and provided [facility] for integrating L UE during bilaterally involved tasks. Pt  was left with lunch setup, all needs within reach, half lap tray, and chair alarm set.      Therapy Documentation Precautions:  Precautions Precautions: Fall Precaution Comments: L inattention, L hemiparesis Restrictions Weight Bearing Restrictions: No Pain: No c/o pain during session    ADL:      See Function Navigator for Current Functional Status.   Therapy/Group: Individual Therapy  [name] [name] A [name] [name] 08/11/2017, 12:53 PM

## 2017-08-12 ENCOUNTER — Inpatient Hospital Stay (HOSPITAL_COMMUNITY): Payer: Managed Care, Other (non HMO)

## 2017-08-12 ENCOUNTER — Inpatient Hospital Stay (HOSPITAL_COMMUNITY): Payer: Managed Care, Other (non HMO) | Admitting: Occupational Therapy

## 2017-08-12 DIAGNOSIS — R0989 Other specified symptoms and signs involving the circulatory and respiratory systems: Secondary | ICD-10-CM

## 2017-08-12 LAB — CBC
HEMATOCRIT: 48.4 % (ref 39.0–52.0)
Hemoglobin: 16.7 g/dL (ref 13.0–17.0)
MCH: 33.5 pg (ref 26.0–34.0)
MCHC: 34.5 g/dL (ref 30.0–36.0)
MCV: 97.2 fL (ref 78.0–100.0)
Platelets: 290 10*3/uL (ref 150–400)
RBC: 4.98 MIL/uL (ref 4.22–5.81)
RDW: 11.2 % — ABNORMAL LOW (ref 11.5–15.5)
WBC: 10.1 10*3/uL (ref 4.0–10.5)

## 2017-08-12 LAB — BASIC METABOLIC PANEL
Anion gap: 7 (ref 5–15)
BUN: 14 mg/dL (ref 6–20)
CHLORIDE: 106 mmol/L (ref 101–111)
CO2: 30 mmol/L (ref 22–32)
CREATININE: 0.87 mg/dL (ref 0.61–1.24)
Calcium: 9.5 mg/dL (ref 8.9–10.3)
GFR calc Af Amer: >60 mL/min (ref >60)
GFR calc non Af Amer: >60 mL/min (ref >60)
Glucose, Bld: 100 mg/dL — ABNORMAL HIGH (ref 65–99)
POTASSIUM: 4.2 mmol/L (ref 3.5–5.1)
SODIUM: 143 mmol/L (ref 135–145)

## 2017-08-12 NOTE — Progress Notes (Signed)
Physical Therapy Session Note  Patient Details  Name: [name] [name] MRN: [id number] Date of Birth: 11/12/1965  Today's Date: 08/12/2017 PT Individual Time: 4098-11911330-1345 PT Individual Time Calculation (min): 15 min   Short Term Goals: Week 1:  PT Short Term Goal 1 (Week 1): Pt will perform bed mobility S from flat bed PT Short Term Goal 2 (Week 1): Pt will transfer w/c <>bed with consistent minA PT Short Term Goal 3 (Week 1): pt wll ambulate x75' with LRAD and minA PT Short Term Goal 4 (Week 1): Pt will ascend/descent 8 steps minA  Week 2:     Skilled Therapeutic Interventions/Progress Updates:   Pt sound asleep and difficult to awaken.  Eventually with use of calling name, tactile cues he woke up.   neuromuscular re-education via multimodal cues for bil bridging x 5, and L unilateral bridging x 5, focusing on eccentric control, and R hip abduction in L side lying, x 5.   Pt very reluctant to participate, continually dozing off.  PT suggested several options for type of tx; pt unwilling to do any of them, and rolled over R/L to avoid therapist.      PT consulted with Whitney, RN who stated that pt does not sleep well at night.   Therapy Documentation Precautions:  Precautions Precautions: Fall Precaution Comments: L inattention, L hemiparesis Restrictions Weight Bearing Restrictions: No General: PT Amount of Missed Time (min): 45 Minutes PT Missed Treatment Reason: Patient unwilling to participate;Patient fatigue Pain: Pain Assessment Pain Scale: Faces Pain Score: 0-No pain Faces Pain Scale: Hurts a little bit Pain Type: Acute pain Pain Location: Hand Pain Orientation: Left Pain Onset: With Activity Pain Intervention(s): Repositioned    See Function Navigator for Current Functional Status.   Therapy/Group: Individual Therapy  [name] [name] 08/12/2017, 2:21 PM

## 2017-08-12 NOTE — Progress Notes (Signed)
Speech Language Pathology Daily Session Note  Patient Details  Name: [name] [name] MRN: [id number] Date of Birth: 04/27/1965  Today's Date: 08/12/2017 SLP Individual Time: [id number] SLP Individual Time Calculation (min): 41 min  Short Term Goals: Week 1: SLP Short Term Goal 1 (Week 1): Pt will use slow rate, overarticulation, and increased vocal intensity to achieve intelligibility at the conversational level in a noisy, distracting environment.  SLP Short Term Goal 2 (Week 1): Pt will recall daily information with min cues for use of memory compensatory strategies.  SLP Short Term Goal 3 (Week 1): Pt will complete semi-complex tasks with min assist verbal cues for functional problem solving  SLP Short Term Goal 4 (Week 1): Pt will selectively attend to task in a moderately distracting environment for 30 minutes with min verbal cues for redirection.  SLP Short Term Goal 5 (Week 1): Pt will locate items to the left of midline during functional tasks for >75% accuracy with min assist verbal cues.   Skilled Therapeutic Interventions:Skilled ST services focused on  Cognitive skills. SLP facilitated semi-complex problem solving skills utilizing ALFA daily math problems, pt required max-mod A verbal cues with mod A verbal cues to scan left and mod A for selective attention with door open. Pt was left in room with call bell within reach. Recommend to continue skilled ST services.      Function:  Eating Eating   Modified Consistency Diet: No Eating Assist Level: Set up assist for   Eating Set Up Assist For: Opening containers;Cutting food       Cognition Comprehension Comprehension assist level: Understands basic 90% of the time/cues < 10% of the time  Expression   Expression assist level: Expresses basic 75 - 89% of the time/requires cueing 10 - 24% of the time. Needs helper to occlude trach/needs to repeat words.  Social Interaction Social Interaction assist level: Interacts appropriately  75 - 89% of the time - Needs redirection for appropriate language or to initiate interaction.  Problem Solving Problem solving assist level: Solves basic 50 - 74% of the time/requires cueing 25 - 49% of the time  Memory Memory assist level: Recognizes or recalls 75 - 89% of the time/requires cueing 10 - 24% of the time;Recognizes or recalls 50 - 74% of the time/requires cueing 25 - 49% of the time    Pain Pain Assessment Pain Scale: Faces Pain Score: 0-No pain Faces Pain Scale: Hurts a little bit Pain Type: Acute pain Pain Location: Hand Pain Orientation: Left Pain Onset: With Activity Pain Intervention(s): Repositioned  Therapy/Group: Individual Therapy  Herbert Marken  [facility] 08/12/2017, 12:59 PM

## 2017-08-12 NOTE — Plan of Care (Signed)
  Problem: Consults Goal: RH STROKE PATIENT EDUCATION Description See Patient Education module for education specifics  Outcome: Progressing   Problem: RH BOWEL ELIMINATION Goal: RH STG MANAGE BOWEL WITH ASSISTANCE Description STG Manage Bowel with  Mod I Assistance.  Outcome: Progressing Goal: RH STG MANAGE BOWEL W/MEDICATION W/ASSISTANCE Description STG Manage Bowel with Medication with mod I  Assistance.  Outcome: Progressing   Problem: RH SAFETY Goal: RH STG ADHERE TO SAFETY PRECAUTIONS W/ASSISTANCE/DEVICE Description STG Adhere to Safety Precautions With cues/supervision Assistance/Device.  Outcome: Progressing   Problem: RH KNOWLEDGE DEFICIT Goal: RH STG INCREASE KNOWLEDGE OF HYPERTENSION Description Pt will be able to explain medication regimen and diet restrictions to manage hypertension and secondary stroke prevention with cues/reminders/resources  Outcome: Progressing

## 2017-08-12 NOTE — Progress Notes (Signed)
At 1958 BP was 148/102 when reassessed at 2025 it came down to 146/91, we continue to monitor.

## 2017-08-12 NOTE — Progress Notes (Signed)
[facility] PHYSICAL MEDICINE & REHABILITATION     PROGRESS NOTE  Subjective/Complaints:  Patient seen sitting up in his chair this morning. Discussed blood pressures with nursing. Patient states he slept fairly overnight and a good weekend.  ROS: denies CP, SOB, nausea, vomiting, diarrhea.  Objective: Vital Signs: Blood pressure 135/90, pulse 69, temperature 98.6 F (37 C), temperature source Oral, resp. rate 18, height 5\' 5"  (1.651 m), weight 72.2 kg (159 lb 2.8 oz), SpO2 98 %. No results found. Recent Labs    08/12/17 0738  WBC 10.1  HGB 16.7  HCT 48.4  PLT 290   No results for input(s): NA, K, CL, GLUCOSE, BUN, CREATININE, CALCIUM in the last 72 hours.  Invalid input(s): CO CBG (last 3)  No results for input(s): GLUCAP in the last 72 hours.  Wt Readings from Last 3 Encounters:  08/07/17 72.2 kg (159 lb 2.8 oz)  07/27/17 76.3 kg (168 lb 3.2 oz)  09/07/13 72.2 kg (159 lb 2 oz)    Physical Exam:  BP 135/90 (BP Location: Right Arm)   Pulse 69   Temp 98.6 F (37 C) (Oral)   Resp 18   Ht 5\' 5"  (1.651 m)   Wt 72.2 kg (159 lb 2.8 oz)   SpO2 98%   BMI 26.49 kg/m  Constitutional: No distress . Vital signs reviewed. HENT: Normocephalic.  Atraumatic. Eyes: EOMI. No discharge. Cardiovascular: RRR. No JVD. Respiratory: CTA Bilaterally. Normal effort. GI: BS +. Non-distended. Musc: No edema or tenderness in extremities. Neurological: He is alert and oriented..  Follows commands Left facial weakness with mild dysarthria persists.  Left upper extremity: Shoulder abduction 1/5, elbow flexion/extension 2+/5, wrist extension, hand grip 0/5  Left lower extremity: 4/5 proximal to distal Emerging tone LUE/LLE Skin: Skin is warm and dry. Left axilla improving Psychiatric: His affect is blunt.   Assessment/Plan: 1. Functional deficits secondary to right MCA infarct which require 3+ hours per day of interdisciplinary therapy in a comprehensive inpatient rehab  setting. Physiatrist is providing close team supervision and 24 hour management of active medical problems listed below. Physiatrist and rehab team continue to assess barriers to discharge/monitor patient progress toward functional and medical goals.  Function:  Bathing Bathing position   Position: Shower  Bathing parts Body parts bathed by patient: Chest, Abdomen, Left arm, Front perineal area, Buttocks, Right upper leg, Left upper leg, Right lower leg, Left lower leg Body parts bathed by helper: Right arm, Back  Bathing assist        Upper Body Dressing/Undressing Upper body dressing   What is the patient wearing?: Pull over shirt/dress     Pull over shirt/dress - Perfomed by patient: Put head through opening, Thread/unthread right sleeve Pull over shirt/dress - Perfomed by helper: Thread/unthread left sleeve, Pull shirt over trunk        Upper body assist Assist Level: (Mod A)      Lower Body Dressing/Undressing Lower body dressing   What is the patient wearing?: Pants, Non-skid slipper socks, Underwear Underwear - Performed by patient: Thread/unthread right underwear leg, Thread/unthread left underwear leg, Pull underwear up/down   Pants- Performed by patient: Thread/unthread right pants leg, Thread/unthread left pants leg, Pull pants up/down Pants- Performed by helper: Thread/unthread left pants leg Non-skid slipper socks- Performed by patient: Don/doff right sock, Don/doff left sock Non-skid slipper socks- Performed by helper: Don/doff left sock Socks - Performed by patient: Don/doff right sock, Don/doff left sock   Shoes - Performed by patient: Don/doff right shoe,  Don/doff left shoe Shoes - Performed by helper: Fasten right, Fasten left          Lower body assist Assist for lower body dressing: (Mod A dynamic standing balance, Min A dynamic sitting balance)      Toileting Toileting   Toileting steps completed by patient: Adjust clothing prior to toileting,  Performs perineal hygiene Toileting steps completed by helper: Adjust clothing after toileting    Toileting assist Assist level: Touching or steadying assistance (Pt.75%)   Transfers Chair/bed transfer   Chair/bed transfer method: Stand pivot Chair/bed transfer assist level: Touching or steadying assistance (Pt > 75%) Chair/bed transfer assistive device: Armrests     Locomotion Ambulation     Max distance: 13640ft Assist level: Touching or steadying assistance (Pt > 75%)   Wheelchair   Type: Manual Max wheelchair distance: 100 Assist Level: Touching or steadying assistance (Pt > 75%)  Cognition Comprehension Comprehension assist level: Understands basic 90% of the time/cues < 10% of the time  Expression Expression assist level: Expresses basic 75 - 89% of the time/requires cueing 10 - 24% of the time. Needs helper to occlude trach/needs to repeat words.  Social Interaction Social Interaction assist level: Interacts appropriately 75 - 89% of the time - Needs redirection for appropriate language or to initiate interaction.  Problem Solving Problem solving assist level: Solves basic 75 - 89% of the time/requires cueing 10 - 24% of the time  Memory Memory assist level: Recognizes or recalls 75 - 89% of the time/requires cueing 10 - 24% of the time    Medical Problem List and Plan: 1.  Left inattention and left sided weakness affecting mobility as well as ability to carry out ADL tasks secondary to right MCA infarct.  Cont CIR  WHO/PRAFO   Fluoxetine ordered on 6/4 2.  DVT Prophylaxis/Anticoagulation: Pharmaceutical: Lovenox 3. Pain Management: tylenol prn.  4. Mood: LCSW to follow for evaluation and support.  5. Neuropsych: This patient is capable of making decisions on his own behalf. 6. Skin/Wound Care: Routine pressure relief measures.   Continue Keflex (started on 6/6)  7. Fluids/Electrolytes/Nutrition: Monitor I/O.   BMP within normal range on 6/10 8. HTN: Monitor BP bid.  Was on Lisinopril/HCTZ 20-25 daily at home  Slightly labile on 6/10 9. Constipation: Cont bowel meds 10 GERD: Continue Protonix.   11. Tobacco/polysubstance  abuse: Encourage cessation. IS while awake to help with cough.  12. Right ICA stenosis: CEA in 4 weeks 13. Leukocytosis  WBCs 10.1 on 6/10  Afebrile  Continue to monitor 14. Sleep disturbance  Melatonin started on 6/5 increased on 6/7  Trazodone/melatonin timing adjusted to-8 PM per patient request  LOS (Days) 7 A FACE TO FACE EVALUATION WAS PERFORMED  Canyon Lohr [name] [name] Krystan Northrop 08/12/2017 8:32 AM

## 2017-08-12 NOTE — Progress Notes (Signed)
Occupational Therapy Session Note  Patient Details  Name: Landis Gandydward D Esham MRN: [id number] Date of Birth: 03/23/1965  Today's Date: 08/12/2017 OT Individual Time: [id number] OT Individual Time Calculation (min): 58 min    Short Term Goals: Week 1:  OT Short Term Goal 1 (Week 1): Pt will complete UB dressing with supervision and min instructional cueing for donning pullover shirt.  OT Short Term Goal 2 (Week 1): Pt will complete LB bathing with supervision sit to stand.  OT Short Term Goal 3 (Week 1): Pt will complete toilet transfers with min guard assist using the RW to the elevated toilet. OT Short Term Goal 4 (Week 1): Pt will complete walk-in shower transfers with min guard assist using the RW to the tub bench. OT Short Term Goal 5 (Week 1): Pt will use the LUE as an gross assist for holding items to be opened with no more than mod instructional cueing and min assist.   Skilled Therapeutic Interventions/Progress Updates:    Pt completed shower and dressing sit to stand during session.  He transferred into the shower with min assist and no assistive device.  Increased left knee hyperextension noted as well as decreased efficiency for advancing the LLE.  Once in the shower he just let the water run over him for a few mins, needing mod instructional cueing for initiation and for sequencing all aspects.  Max hand over hand for washing the RUE with the LUE as well as for holding the soap to pour on the washcloth.  He completed dressing sit to stand at the sink.  Mod demonstrational cueing with mod assist for pullover shirt following hemi dressing techniques.  He was able to donn underpants and pants with min assist sit to stand.  Mod assist for socks and shoes, as well as max assist for tying the left shoe.  He was able to donn the right one with it already tied.  Pt left in wheelchair with call button and phone in reach.  Chair alarm and safety belt also in place.   Therapy Documentation Precautions:   Precautions Precautions: Fall Precaution Comments: L inattention, L hemiparesis Restrictions Weight Bearing Restrictions: No   Pain: Pain Assessment Pain Scale: Faces Faces Pain Scale: Hurts a little bit Pain Type: Acute pain Pain Location: Hand Pain Orientation: Left Pain Onset: With Activity Pain Intervention(s): Repositioned ADL: See Function Navigator for Current Functional Status.   Therapy/Group: Individual Therapy  Ritter Helsley OTR/L 08/12/2017, 12:41 PM

## 2017-08-12 NOTE — Progress Notes (Signed)
Occupational Therapy Weekly Progress Note  Patient Details  Name: [name] [name] MRN: [id number] Date of Birth: 13-Feb-1966  Beginning of progress report period: August 06, 2017 End of progress report period: August 12, 2017  Today's Date: 08/12/2017 OT Individual Time: 1455-1537 OT Individual Time Calculation (min): 42 min    Patient has met 0 of 5 short term goals.  Mr. Turgeon is making slow but steady progress with OT at this time.  He continues to need min assist for transfers to the toilet and the walk-in shower without use of an assistive device.  He needs mod to max instructional cueing for sequencing bathing and dressing tasks secondary to initiation deficits and attention deficits.  He also exhibits a left visual field deficit as well as a left neglect.  Currently he needs min assist for all bathing sit to stand and mod assist for all dressing.  He continues to demonstrate perceptual deficits when attempting to donn his pullover shirt, requiring max assist for orientation and sequencing.  LUE functional movement is currently at a Brunnstrum stage II-III movement in the left arm and stage II in the hand.  He continues to need max assist for integration into selfcare tasks at this time.  Recommend continued CIR level OT to progress to supervision/min assist level goals for discharge.    Patient continues to demonstrate the following deficits: muscle weakness, impaired timing and sequencing, unbalanced muscle activation and decreased coordination, field cut, decreased attention to left and left side neglect, decreased initiation, decreased attention, decreased awareness, decreased problem solving, decreased safety awareness, decreased memory and delayed processing and decreased standing balance, decreased postural control, hemiplegia and decreased balance strategies and therefore will continue to benefit from skilled OT intervention to enhance overall performance with BADL.  Patient progressing toward  long term goals..  Continue plan of care.  OT Short Term Goals Week 2:  OT Short Term Goal 1 (Week 2): Pt will complete UB dressing with supervision and min instructional cueing for donning pullover shirt.  OT Short Term Goal 2 (Week 2): Pt will complete LB bathing with supervision sit to stand.  OT Short Term Goal 3 (Week 2): Pt will complete walk-in shower transfers with min guard assist using the RW to the tub bench. OT Short Term Goal 4 (Week 2): Pt will complete toilet transfers with min guard assist using the RW to the elevated toilet. OT Short Term Goal 5 (Week 2): Pt will use the LUE as an gross assist for holding items to be opened with no more than mod instructional cueing and mod assist.   Skilled Therapeutic Interventions/Progress Updates:    Pt completed transfer from supine to sit with supervision and then to the wheelchair with min assist.  Took pt down to the therapy gym where he worked on LUE neuromuscular re-education for the hand.  Therapist applied NMES to the left digit extensors for 10 mins on custom program for 10 mins with intensity on level 23 and PPS at 50, pulse width at 300, on/off time 10 seconds.  Pt tolerated without any adverse reactions, but did state left shoulder pain.  Noted anterior inferior subluxation as well.  Therapist applied kinesiotape to the upper glenohumeral region of the left arm to help decrease pain control and help with positioning.  Finished session with transfer back to the chair and then transfer back to the bed, both at min assist stand pivot levels.  LUE supported on pillows with bed alarm in place and call  button and phone in reach.    Therapy Documentation Precautions:  Precautions Precautions: Fall Precaution Comments: L inattention, L hemiparesis Restrictions Weight Bearing Restrictions: No  Pain: Pain Assessment Pain Scale: Faces Pain Score: 0-No pain Faces Pain Scale: Hurts little more Pain Type: Acute pain Pain Location:  Shoulder Pain Orientation: Left Pain Descriptors / Indicators: Discomfort Pain Onset: On-going Pain Intervention(s): Repositioned;Emotional support ADL: See Function Navigator for Current Functional Status.   Therapy/Group: Individual Therapy  Nelissa Bolduc OTR/L 08/12/2017, 4:33 PM

## 2017-08-13 ENCOUNTER — Inpatient Hospital Stay (HOSPITAL_COMMUNITY): Payer: Managed Care, Other (non HMO) | Admitting: Occupational Therapy

## 2017-08-13 ENCOUNTER — Encounter (HOSPITAL_COMMUNITY): Payer: Managed Care, Other (non HMO) | Admitting: Psychology

## 2017-08-13 ENCOUNTER — Inpatient Hospital Stay (HOSPITAL_COMMUNITY): Payer: Managed Care, Other (non HMO) | Admitting: Physical Therapy

## 2017-08-13 ENCOUNTER — Inpatient Hospital Stay (HOSPITAL_COMMUNITY): Payer: Managed Care, Other (non HMO) | Admitting: Speech Pathology

## 2017-08-13 DIAGNOSIS — H04123 Dry eye syndrome of bilateral lacrimal glands: Secondary | ICD-10-CM

## 2017-08-13 MED ORDER — CEPHALEXIN 250 MG PO CAPS
250.0000 mg | ORAL_CAPSULE | Freq: Three times a day (TID) | ORAL | Status: AC
  Administered 2017-08-13 – 2017-08-14 (×5): 250 mg via ORAL
  Filled 2017-08-13 (×5): qty 1

## 2017-08-13 MED ORDER — POLYVINYL ALCOHOL 1.4 % OP SOLN
1.0000 [drp] | OPHTHALMIC | Status: DC | PRN
Start: 2017-08-13 — End: 2017-08-21
  Administered 2017-08-13 – 2017-08-16 (×3): 1 [drp] via OPHTHALMIC
  Filled 2017-08-13: qty 15

## 2017-08-13 MED ORDER — ENOXAPARIN SODIUM 40 MG/0.4ML ~~LOC~~ SOLN
40.0000 mg | SUBCUTANEOUS | Status: DC
  Administered 2017-08-13 – 2017-08-20 (×8): 40 mg via SUBCUTANEOUS
  Filled 2017-08-13 (×8): qty 0.4

## 2017-08-13 NOTE — Progress Notes (Signed)
Occupational Therapy Session Note  Patient Details  Name: [name] [name] MRN: [id number] Date of Birth: 04/07/1965  Today's Date: 08/13/2017 OT Individual Time: [id number] OT Individual Time Calculation (min): 47 min    Short Term Goals: Week 2:  OT Short Term Goal 1 (Week 2): Pt will complete UB dressing with supervision and min instructional cueing for donning pullover shirt.  OT Short Term Goal 2 (Week 2): Pt will complete LB bathing with supervision sit to stand.  OT Short Term Goal 3 (Week 2): Pt will complete walk-in shower transfers with min guard assist using the RW to the tub bench. OT Short Term Goal 4 (Week 2): Pt will complete toilet transfers with min guard assist using the RW to the elevated toilet. OT Short Term Goal 5 (Week 2): Pt will use the LUE as an gross assist for holding items to be opened with no more than mod instructional cueing and mod assist.   Skilled Therapeutic Interventions/Progress Updates:    Took pt to the therapy gym to start session.  Applied NMES to the flexors of the left hand.  When stimulation was activated, he reported increased pain in the left shoulder as well as increased pain in the hand.  Intensity was only on level 13, just enough to elicit active motion.  He only tolerated 1-2 mins before it was halted.  Therapist then placed pads on the left supraspinatus and the left medial deltoid.  He was able to tolerate 15 mins of active stimulation at level 23 intensity.  PPS were at 50 and pulse width/duration was 300.  On/off time was set at 10 seconds.  When stimulation was active, had pt work on opening hand with AAROM as well.  Transitioned to use of the Dyn[employer]ision while stimulation was active as well.  In sitting, he was able to complete intervals of 60 seconds with pretty efficient times.  Reaction time overall was 1.13 seconds with 1.25 noted in the left upper and lower quadrants.  Noted 5th ring was eliminated secondary to pt not being able to reach it  from the wheelchair.  When given a time limit of 1.5 seconds per light he could complete 62/66 and 40/48.  When told to state a 2 digit number on the T-scope while pressing out the lights, he could not state any numbers and got only 41/50 lights with 2 second intervals for each light.  Returned to room at conclusion of trials and completion of electrical stimulation.  He reported an improvement in his left shoulder pain as well.  Pt left up in the wheelchair for next session with neuropsychologist.  Call button in reach with safety belt and chair alarm in place.  LUE supported on half lap tray as well.    Therapy Documentation Precautions:  Precautions Precautions: Fall Precaution Comments: L inattention, L hemiparesis Restrictions Weight Bearing Restrictions: No  Pain: Pain Assessment Pain Scale: 0-10 Pain Score: 0-No pain Faces Pain Scale: Hurts a little bit Pain Type: Acute pain Pain Location: Shoulder Pain Orientation: Left Pain Descriptors / Indicators: Discomfort Pain Onset: With Activity Pain Intervention(s): Repositioned;Cutaneous stimulation ADL: See Function Navigator for Current Functional Status.   Therapy/Group: Individual Therapy  Becci Batty OTR/L 08/13/2017, 4:22 PM

## 2017-08-13 NOTE — Progress Notes (Signed)
[facility]ILITATION     PROGRESS NOTE  Subjective/Complaints:  Patient seen lying in bed this morning. He states he slept well overnight, confirmed with sleep chart. He states his eyes itch.  ROS: + eyes itch. Denies CP, SOB, nausea, vomiting, diarrhea.  Objective: Vital Signs: Blood pressure (!) 130/97, pulse 61, temperature 98.2 F (36.8 C), temperature source Oral, resp. rate 18, height 5\' 5"  (1.651 m), weight 72.2 kg (159 lb 2.8 oz), SpO2 92 %. No results found. Recent Labs    08/12/17 0738  WBC 10.1  HGB 16.7  HCT 48.4  PLT 290   Recent Labs    08/12/17 0738  NA 143  K 4.2  CL 106  GLUCOSE 100*  BUN 14  CREATININE 0.87  CALCIUM 9.5   CBG (last 3)  No results for input(s): GLUCAP in the last 72 hours.  Wt Readings from Last 3 Encounters:  08/07/17 72.2 kg (159 lb 2.8 oz)  07/27/17 76.3 kg (168 lb 3.2 oz)  09/07/13 72.2 kg (159 lb 2 oz)    Physical Exam:  BP (!) 130/97 (BP Location: Left Arm)   Pulse 61   Temp 98.2 F (36.8 C) (Oral)   Resp 18   Ht 5\' 5"  (1.651 m)   Wt 72.2 kg (159 lb 2.8 oz)   SpO2 92%   BMI 26.49 kg/m  Constitutional: No distress . Vital signs reviewed. HENT: Normocephalic.  Atraumatic. Eyes: EOMI. No discharge. Cardiovascular: RRR. No JVD. Respiratory: CTA Bilaterally. Normal effort. GI: BS +. Non-distended. Musc: No edema or tenderness in extremities. Neurological: He is alert and oriented..  Follows commands Left facial weakness with mild dysarthria persists.  Left upper extremity: Shoulder abduction 1+/5 (some pain inhibition), elbow flexion/extension 3+-4-/5, wrist extension, hand grip 0/5  Left lower extremity: 4/5 proximal to distal Emerging tone LUE/LLE Skin: Skin is warm and dry. Left axilla improving Psychiatric: His affect is blunt.   Assessment/Plan: 1. Functional deficits secondary to right MCA infarct which require 3+ hours per day of interdisciplinary therapy in a comprehensive  inpatient rehab setting. Physiatrist is providing close team supervision and 24 hour management of active medical problems listed below. Physiatrist and rehab team continue to assess barriers to discharge/monitor patient progress toward functional and medical goals.  Function:  Bathing Bathing position   Position: Shower  Bathing parts Body parts bathed by patient: Chest, Abdomen, Left arm, Front perineal area, Buttocks, Right upper leg, Left upper leg, Right lower leg, Left lower leg Body parts bathed by helper: Right arm, Back  Bathing assist Assist Level: Touching or steadying assistance(Pt > 75%)      Upper Body Dressing/Undressing Upper body dressing   What is the patient wearing?: Pull over shirt/dress     Pull over shirt/dress - Perfomed by patient: Put head through opening, Thread/unthread right sleeve Pull over shirt/dress - Perfomed by helper: Thread/unthread left sleeve, Pull shirt over trunk        Upper body assist Assist Level: (Mod A)      Lower Body Dressing/Undressing Lower body dressing   What is the patient wearing?: Pants, Non-skid slipper socks, Underwear, Shoes, Socks Underwear - Performed by patient: Thread/unthread right underwear leg, Thread/unthread left underwear leg Underwear - Performed by helper: Pull underwear up/down Pants- Performed by patient: Thread/unthread right pants leg, Thread/unthread left pants leg Pants- Performed by helper: Pull pants up/down Non-skid slipper socks- Performed by patient: Don/doff right sock, Don/doff left sock Non-skid slipper socks- Performed by helper: Don/doff  left sock Socks - Performed by patient: Don/doff right sock Socks - Performed by helper: Don/doff left sock Shoes - Performed by patient: Don/doff right shoe Shoes - Performed by helper: Don/doff left shoe, Fasten left          Lower body assist Assist for lower body dressing: (Mod A dynamic standing balance, Min A dynamic sitting balance)       Toileting Toileting   Toileting steps completed by patient: Performs perineal hygiene Toileting steps completed by helper: Adjust clothing prior to toileting, Adjust clothing after toileting Toileting Assistive Devices: Grab bar or rail  Toileting assist Assist level: Touching or steadying assistance (Pt.75%)   Transfers Chair/bed transfer   Chair/bed transfer method: Stand pivot Chair/bed transfer assist level: Touching or steadying assistance (Pt > 75%) Chair/bed transfer assistive device: Armrests     Locomotion Ambulation     Max distance: 15ft Assist level: Touching or steadying assistance (Pt > 75%)   Wheelchair   Type: Manual Max wheelchair distance: 100 Assist Level: Touching or steadying assistance (Pt > 75%)  Cognition Comprehension Comprehension assist level: Follows basic conversation/direction with no assist  Expression Expression assist level: Expresses basic needs/ideas: With no assist  Social Interaction Social Interaction assist level: Interacts appropriately 90% of the time - Needs monitoring or encouragement for participation or interaction.  Problem Solving Problem solving assist level: Solves basic problems with no assist  Memory Memory assist level: Recognizes or recalls 90% of the time/requires cueing < 10% of the time    Medical Problem List and Plan: 1.  Left inattention and left sided weakness affecting mobility as well as ability to carry out ADL tasks secondary to right MCA infarct.  Cont CIR  WHO/PRAFO   Fluoxetine ordered on 6/4 2.  DVT Prophylaxis/Anticoagulation: Pharmaceutical: Lovenox 3. Pain Management: tylenol prn.  4. Mood: LCSW to follow for evaluation and support.  5. Neuropsych: This patient is capable of making decisions on his own behalf. 6. Skin/Wound Care: Routine pressure relief measures.   Continue Keflex (6/6-6/12)  7. Fluids/Electrolytes/Nutrition: Monitor I/O.   BMP within normal range on 6/10 8. HTN: Monitor BP bid. Was  on Lisinopril/HCTZ 20-25 daily at home  Overall controlled on 6/11 9. Constipation: Cont bowel meds 10 GERD: Continue Protonix.   11. Tobacco/polysubstance  abuse: Encourage cessation. IS while awake to help with cough.  12. Right ICA stenosis: CEA in 4 weeks 13. Leukocytosis  WBCs 10.1 on 6/10  Afebrile  Continue to monitor 14. Sleep disturbance  Melatonin started on 6/5 increased on 6/7  Trazodone/melatonin timing adjusted to-8 PM per patient request   Improving 15. Dry  Artificial tears started on 6/11  LOS (Days) 8 A FACE TO FACE EVALUATION WAS PERFORMED  Philopater Mucha [name] Juba 08/13/2017 8:02 AM

## 2017-08-13 NOTE — Progress Notes (Signed)
Physical Therapy Weekly Progress Note  Patient Details  Name: [name] [name] MRN: [id number] Date of Birth: 03/29/1965  Beginning of progress report period: August 06, 2017 End of progress report period: August 13, 2017  Today's Date: 08/13/2017 PT Individual Time: 6659-9357 PT Individual Time Calculation (min): 55 min   Patient has met 4 of 4 short term goals.  Pt currently requires S for bed mobility, min guard for stand pivot transfers, min guard/minA for gait with RW and L hand splint. Continues to be limited by improving impulsivity, L hemiparesis, L inattention. Patient continues to demonstrate the following deficits muscle weakness and muscle paralysis, decreased cardiorespiratoy endurance, impaired timing and sequencing, unbalanced muscle activation, decreased coordination and decreased motor planning, decreased visual perceptual skills, decreased attention to left and decreased motor planning, decreased attention, decreased awareness, decreased problem solving, decreased safety awareness, decreased memory and delayed processing and decreased sitting balance, decreased standing balance, decreased postural control, hemiplegia and decreased balance strategies and therefore will continue to benefit from skilled PT intervention to increase functional independence with mobility.  Patient progressing toward long term goals..  Continue plan of care.  PT Short Term Goals Week 1:  PT Short Term Goal 1 (Week 1): Pt will perform bed mobility S from flat bed PT Short Term Goal 1 - Progress (Week 1): Met PT Short Term Goal 2 (Week 1): Pt will transfer w/c <>bed with consistent minA PT Short Term Goal 2 - Progress (Week 1): Met PT Short Term Goal 3 (Week 1): pt wll ambulate x75' with LRAD and minA PT Short Term Goal 3 - Progress (Week 1): Met PT Short Term Goal 4 (Week 1): Pt will ascend/descent 8 steps minA  PT Short Term Goal 4 - Progress (Week 1): Met Week 2:  PT Short Term Goal 1 (Week 2): Pt will  perform bed mobility modI from flat bed PT Short Term Goal 2 (Week 2): Pt will perform stand pivot transfer consistent S PT Short Term Goal 3 (Week 2): Pt will ambulate x150' with consistent min guard PT Short Term Goal 4 (Week 2): Pt will ascend/descent 8 steps min guard  Skilled Therapeutic Interventions/Progress Updates: Pt received seated in bed, denies pain and agreeable to treatment. Supine>sit with S. Stand pivot min guard to L side into w/c. Performed oral hygiene at sink with modI, increased time. W/c propulsion RUE/BLE x150' with min cues for technique, improving L awareness of environment. Gait with RW, L hand splint and min guard/minA x90, x150' with seated rest breaks between d/t fatigue. Provided tactile cues/facilitation to reduce L pelvic elevation/vaulting, promote L pelvic anterior rotation and min verbal cues for L step length and attention to L side for improved foot clearance. Lateral step ups BLE 2x10 reps with tactile cues for L hip/knee extension and assist to reduce L recurvatum. Sit <>stand x5 reps with RLE elevated on 6" step to facilitate L weight bearing via forced use. L elbow propped sidelying with bolster under trunk 3x1 min for LUE weight bearing. Elevation/depression mobilizations for scapular ROM and pain management. Attempted modified plantigrade position for LUE weight bearing however pt unable to follow directions for forced use and demo's compensatory movement patterns despite cueing. Gait to return to room with no AD and minA, tactile cues to reduce LUE vaulting, verbal cues for attention to LLE when pt distracted by surroundings or himself when he begins talking to therapist/cousin. Remained supine in bed, alarm intact and all needs in reach.  Therapy Documentation Precautions:  Precautions Precautions: Fall Precaution Comments: L inattention, L hemiparesis Restrictions Weight Bearing Restrictions: No Pain: Pain Assessment Pain Scale: 0-10 Pain Score:  0-No pain   See Function Navigator for Current Functional Status.  Therapy/Group: Individual Therapy  Luberta Mutter 08/13/2017, 9:58 AM

## 2017-08-13 NOTE — Progress Notes (Signed)
Speech Language Pathology Weekly Progress and Session Note  Patient Details  Name: [name] [name] MRN: [id number] Date of Birth: September 02, 1965  Beginning of progress report period: August 06, 2017  End of progress report period: August 13, 2017  Today's Date: 08/13/2017 SLP Individual Time: 1305-1400 SLP Individual Time Calculation (min): 55 min  Short Term Goals: Week 1: SLP Short Term Goal 1 (Week 1): Pt will use slow rate, overarticulation, and increased vocal intensity to achieve intelligibility at the conversational level in a noisy, distracting environment.  SLP Short Term Goal 1 - Progress (Week 1): Other (comment)(not addressed this reporting period to allow focus on cognitive goals, to be continued into next reporting period ) SLP Short Term Goal 2 (Week 1): Pt will recall daily information with min cues for use of memory compensatory strategies.  SLP Short Term Goal 2 - Progress (Week 1): Progressing toward goal SLP Short Term Goal 3 (Week 1): Pt will complete semi-complex tasks with min assist verbal cues for functional problem solving  SLP Short Term Goal 3 - Progress (Week 1): Progressing toward goal SLP Short Term Goal 4 (Week 1): Pt will selectively attend to task in a moderately distracting environment for 30 minutes with min verbal cues for redirection.  SLP Short Term Goal 4 - Progress (Week 1): Progressing toward goal SLP Short Term Goal 5 (Week 1): Pt will locate items to the left of midline during functional tasks for >75% accuracy with min assist verbal cues.  SLP Short Term Goal 5 - Progress (Week 1): Met    New Short Term Goals: Week 2: SLP Short Term Goal 1 (Week 2): Pt will use slow rate, overarticulation, and increased vocal intensity to achieve intelligibility at the conversational level in a noisy, distracting environment.  SLP Short Term Goal 2 (Week 2): Pt will recall daily information with min cues for use of memory compensatory strategies.  SLP Short Term Goal 3  (Week 2): Pt will complete semi-complex tasks with min assist verbal cues for functional problem solving  SLP Short Term Goal 4 (Week 2): Pt will locate items to the left of midline during functional tasks for >75% accuracy with supervision verbal cues.  SLP Short Term Goal 5 (Week 2): Pt will selectively attend to task in a moderately distracting environment for 30 minutes with min verbal cues for redirection.   Weekly Progress Updates:   Pt has made functional gains this reporting period and has met 1 out of 4 targeted short term goals.  Pt is currently mod assist for tasks due to moderate cognitive deficits.  Pt has demonstrated improved visual scanning.  Pt and family education is ongoing.  Pt would continue to benefit from skilled ST while inpatient in order to maximize functional independence and reduce burden of care prior to discharge.  Anticipate that pt will need 24/7 supervision at discharge in addition to [place] follow at next level of care.      Intensity: Minumum of 1-2 x/day, 30 to 90 minutes Frequency: 3 to 5 out of 7 days Duration/Length of Stay: 10-14 days  Treatment/Interventions: Cognitive remediation/compensation;Cueing hierarchy;Environmental controls;Internal/external aids;Functional tasks;Patient/family education   Daily Session  Skilled Therapeutic Interventions: Pt was seen for skilled ST targeting cognitive goals.  Pt was in bed upon arrival, awake, alert, and agreeable to getting out of bed for therapies.  Pt was able to propel his wheelchair without obvious inattention to obstacles on his left; however, pt fatigued quickly and asked therapist to push him the  rest of the way to the therapy room.  SLP facilitated the session with a novel card game targeting visual scanning to the left of midline and attention to task.  Pt needed min cues to locate cards to the left of midline.  He was able to selectively attend to task for ~10 minute intervals in a moderately distracting  environment with min-mod verbal cues for redirection.   SLP also facilitated the session with a novel deductive reasoning task to address problem solving goals.  Pt needed max assist multimodal cues for task organization to complete task.  Pt was returned to room and left in wheelchair with chair alarm set and call bell within reach.  Goals updated on this date to reflect current progress and plan of care.        Function:   Eating Eating                 Cognition Comprehension Comprehension assist level: Understands basic 75 - 89% of the time/ requires cueing 10 - 24% of the time  Expression   Expression assist level: Expresses basic 90% of the time/requires cueing < 10% of the time.  Social Interaction Social Interaction assist level: Interacts appropriately 75 - 89% of the time - Needs redirection for appropriate language or to initiate interaction.  Problem Solving Problem solving assist level: Solves basic 50 - 74% of the time/requires cueing 25 - 49% of the time  Memory Memory assist level: Recognizes or recalls 75 - 89% of the time/requires cueing 10 - 24% of the time   General    Pain Pain Assessment Pain Scale: 0-10 Pain Score: 0-No pain   Therapy/Group: Individual Therapy  Shayleen Eppinger, Selinda Orion 08/13/2017, 3:48 PM

## 2017-08-13 NOTE — Progress Notes (Signed)
Occupational Therapy Session Note  Patient Details  Name: [name] [name] MRN: [id number] Date of Birth: 11/04/1965  Today's Date: 08/13/2017 OT Individual Time: 1101-1200 OT Individual Time Calculation (min): 59 min    Short Term Goals: Week 2:  OT Short Term Goal 1 (Week 2): Pt will complete UB dressing with supervision and min instructional cueing for donning pullover shirt.  OT Short Term Goal 2 (Week 2): Pt will complete LB bathing with supervision sit to stand.  OT Short Term Goal 3 (Week 2): Pt will complete walk-in shower transfers with min guard assist using the RW to the tub bench. OT Short Term Goal 4 (Week 2): Pt will complete toilet transfers with min guard assist using the RW to the elevated toilet. OT Short Term Goal 5 (Week 2): Pt will use the LUE as an gross assist for holding items to be opened with no more than mod instructional cueing and mod assist.   Skilled Therapeutic Interventions/Progress Updates:     Pt completed shower and dressing sit to stand during session.  He transferred into the shower with min assist and no assistive device.  Increased left knee hyperextension noted as well as decreased efficiency for advancing the LLE.  Once in the shower he completed bathing sit to stand with min instructional cueing for sequencing and remembering to wash his LLE.  Max hand over hand for washing the RUE with the LUE as well as for holding the soap to pour on the washcloth.  He completed dressing sit to stand at the sink.  Mod demonstrational cueing with mod assist for pullover shirt following hemi dressing techniques.  He was able to donn underpants and pants with min assist sit to stand, however he needed 2 attempts to complete donning his shorts as on the first attempt he placed both LEs into the same leg hole.  Upon standing and pulling them up over his hips, he still did not notice the error, even with mod questioning cueing from therapist.  He was able to correct this on the  second attempt.  Supervision for donning socks this session using one handed technique.  He also donned his shoes over both feet with supervision as well.  Therapist applied shoe button to both shoes and educated pt on use.  Pt left in wheelchair with call button and phone in reach.  Chair alarm and safety belt also in place.  Setup for eating his lunch.       Therapy Documentation Precautions:  Precautions Precautions: Fall Precaution Comments: L inattention, L hemiparesis Restrictions Weight Bearing Restrictions: No  Pain: Pain Assessment Pain Scale: Faces Faces Pain Scale: Hurts a little bit Pain Type: Acute pain Pain Location: Shoulder Pain Orientation: Left Pain Descriptors / Indicators: Discomfort Pain Onset: With Activity Pain Intervention(s): Emotional support;Repositioned ADL: See Function Navigator for Current Functional Status.   Therapy/Group: Individual Therapy  Edy Mcbane OTR/L 08/13/2017, 12:37 PM

## 2017-08-14 ENCOUNTER — Inpatient Hospital Stay (HOSPITAL_COMMUNITY): Payer: Managed Care, Other (non HMO) | Admitting: Physical Therapy

## 2017-08-14 ENCOUNTER — Inpatient Hospital Stay (HOSPITAL_COMMUNITY): Payer: Managed Care, Other (non HMO) | Admitting: *Deleted

## 2017-08-14 ENCOUNTER — Inpatient Hospital Stay (HOSPITAL_COMMUNITY): Payer: Managed Care, Other (non HMO) | Admitting: Speech Pathology

## 2017-08-14 ENCOUNTER — Inpatient Hospital Stay (HOSPITAL_COMMUNITY): Payer: Managed Care, Other (non HMO) | Admitting: Occupational Therapy

## 2017-08-14 NOTE — Progress Notes (Signed)
Charted in errorr

## 2017-08-14 NOTE — Evaluation (Signed)
Recreational Therapy Assessment and Plan  Patient Details  Name: [name] [name] MRN: [id number] Date of Birth: 1965-05-02 Today's Date: 08/14/2017  Rehab Potential:  Good ELOS:   discharge 08/1917 Assessment  Problem List:      Patient Active Problem List   Diagnosis Date Noted  . Leukocytosis   . Internal carotid artery stenosis, right   . Cerebral edema ([place]) 08/05/2017  . Hyperlipidemia 08/05/2017  . Tobacco use disorder 08/05/2017  . Family history of stroke 08/05/2017  . Acute ischemic right MCA stroke ([place]) 08/05/2017  . Cerebral infarction due to embolism of right middle cerebral artery ([place])   . Benign essential HTN   . Slow transit constipation   . Gastroesophageal reflux disease   . Carotid stenosis 08/04/2017  . Cocaine abuse ([place]) 08/04/2017  . Acute encephalopathy   . Essential hypertension   . 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 Staghorn calculus 08/24/2013  . Hemorrhoids, internal, with bleeding and Grade 2 prolapse 08/24/2013    Past Medical History:      Past Medical History:  Diagnosis Date  . GERD (gastroesophageal reflux disease)   . Hypertension   . Nephrolithiasis   . Tobacco abuse    Past Surgical History:       Past Surgical History:  Procedure Laterality Date  . CYSTOSCOPY W/ URETERAL STENT PLACEMENT Bilateral 09/07/2013   Procedure: CYSTOSCOPY WITH RETROGRADE PYELOGRAM/URETERAL STENT PLACEMENT;  Surgeon: [name] Frock, MD;  Location: [facility];  Service: Urology;  Laterality: Bilateral;  . CYSTOSCOPY WITH RETROGRADE PYELOGRAM, URETEROSCOPY AND STENT PLACEMENT Bilateral 09/09/2013   Procedure: CYSTOSCOPY WITH RETROGRADE PYELOGRAM, DIAGNOSTIC URETEROSCOPY AND STENT CHANGE;  Surgeon: [name] Frock, MD;  Location: [facility];  Service: Urology;  Laterality: Bilateral;  . HOLMIUM LASER APPLICATION Right 4/0/9811   Procedure: HOLMIUM LASER APPLICATION;  Surgeon: [name] Frock, MD;  Location: [facility];  Service: Urology;  Laterality: Right;  . HOLMIUM LASER APPLICATION Right 11/03/4780   Procedure: HOLMIUM LASER APPLICATION;  Surgeon: [name] Frock, MD;  Location: [facility];  Service: Urology;  Laterality: Right;  . NEPHROLITHOTOMY Right 09/07/2013   Procedure: FIRST STAGE RIGHT PERCUTANEOUS NEPHROLITHOTOMY  WITH ACCESS, ;  Surgeon: [name] Frock, MD;  Location: [facility];  Service: Urology;  Laterality: Right;  . NEPHROLITHOTOMY Right 09/09/2013   Procedure: RIGHT 2ND STAGE NEPHROLITHOTOMY PERCUTANEOUS;  Surgeon: [name] Frock, MD;  Location: [facility];  Service: Urology;  Laterality: Right;  . UMBILICAL HERNIA REPAIR      Assessment & Plan Clinical Impression:52 year old right-handed male with history of hypertension, tobacco abuse, alcohol abuse, GERD who was admitted on 07/27/2017 with left-sided weakness that started early in a.m. Patient refused to come to the ED initially. UDS was positive for cocaine. CTA head neck/perfusion showed large MCA infarct with high-grade near occlusive stenosis of right-ICA at the bifurcation and occluded right M1 segment with poor collaterals. 2D echo done showed showing EF of 55 to 60% with grade 2 diastolic dysfunction and no PFO. He had declining mental status after admission requiring transfer to ICU. Dr. Vernard [name] was consulted for input and felt that immediate surgical intervention likely not helpful with concerns of hemorrhagic conversion. Follow-up CT head showed mild mass-effect and Dr. Arnoldo [name] recommended treatment with hypertonic saline. Repeat CT 5/30 showed stable infarct however increase in mass-effect and 7 mm right to left midline shift with effacement. Mentation is improving and he has been monitored conservatively.   He continues to be limited by left inattention and  left sided weakness affecting mobility as well as ability to carry out ADL tasks.Therapy has been ongoing and CIR was recommended due to functional deficits.  Patient  transferred to CIR on 08/05/2017 .   Met with pt today to discuss TR services, use of leisure time at discharge, healthy lifestyle choices (related to drugs and alcohol). Pt states intent to stop doing cocaine and drinking but acknowledges it as a challenge.  Due to anticipated discharge date 6/19, no further TR at this time.   Plan No further TR as pt is expected to discharge 6/19.  Will continue to monitor through team.   Recommendations for other services: Neuropsych  Discharge Criteria: Patient will be discharged from TR if patient refuses treatment 3 consecutive times without medical reason.  If treatment goals not met, if there is a change in medical status, if patient makes no progress towards goals or if patient is discharged from hospital.  The above assessment, treatment plan, treatment alternatives and goals were discussed and mutually agreed upon: by patient  [name] [name] 08/14/2017, 4:08 PM

## 2017-08-14 NOTE — Consult Note (Signed)
Neuropsychological Consultation   Patient:   [name] [name]   DOB:   Oct 24, 1965  MR Number:  [id number]  Location:  [name] [address] [facility] [address] [id number] [place] [address] Dept: [phone] Loc: [phone]           Date of Service:   08/13/2017  Start Time:   2 PM End Time:   3 PM  Provider/Observer:  Arley Phenix, Psy.D.       Clinical Neuropsychologist       Billing Code/Service: [phone] 4 Units  Chief Complaint:    [name] [name] is a 52 year old male with history of hypertension, tabacco abuse, alcohol abuse, GERD.  Admitted on 07/27/2017 with left sided weakness.  Patient initially resistant to go to ED.  USD was positive for cocaine.  CTA showed large right MCA (M1) infarct and near occlusive stenosis of right ICA.  Marland Kitchen  Continued with significant left sided motor deficits.    Reason for Service:  She was referred for neuropsychological consultation due to adjustment coping issues following his recent cerebrovascular event that occurred on 07/27/2017 resulting in significant motor deficits.  Below is the HPI for the current admission  HPI: [name] [name]. [name] [name] is a 52 year old right-handed male with history of hypertension, tobacco abuse, alcohol abuse, GERD who was admitted on 07/27/2017 with left-sided weakness that started early in a.m.  Patient refused to come to the ED initially.  UDS was positive for cocaine.  CTA head neck/perfusion showed large MCA infarct with high-grade near occlusive stenosis of right-ICA at the bifurcation and occluded right M1 segment with poor collaterals.  R-MCA compatible thrombus. 2D echo done showed showing EF of 55 to 60% with grade 2 diastolic dysfunction and no PFO.  He had declining mental status after admission requiring transfer to ICU.  Dr. Imogene Burn was consulted for input and felt that immediate surgical intervention likely not helpful with concerns of hemorrhagic  conversion.  Follow-up CT head showed mild mass-effect and Dr. Lovell Sheehan recommended treatment with hypertonic saline.  Repeat CT 5/30 showed stable infarct however increase in mass-effect and 7 mm right to left midline shift with effacement.    Lethargy resolving and he has been monitored conservatively.  He continues to be limited by left inattention and left sided weakness affecting mobility as well as ability to carry out ADL tasks. Therapy has been ongoing and CIR was recommended due to functional deficits.  Current Status:  The patient, during today's clinical interview was clearly stressed about the events that led to a stroke.  The patient did admit to using cocaine prior to the stroke and acknowledges an understanding substance that his alcohol use which played a role in him using cocaine or primary factors in his stroke.  The patient also understands that his long-term tobacco abuse likely of also played a role in his risk factors for stroke.  The patient reports that while he is not dealing with significant depression he is frustrated and stressed about the residual motor deficits that he is experiencing.  Behavioral Observation: [name]  presents as a 52 y.o.-year-old Right Caucasian Male who appeared his stated age. his dress was Appropriate and he was Well Groomed and his manners were Appropriate to the situation.  his participation was indicative of Appropriate and Attentive behaviors.  There were any physical disabilities noted.  he displayed an appropriate level of cooperation and motivation.     Interactions:  Active Appropriate and Attentive  Attention:   within normal limits and attention span and concentration were age appropriate  Memory:   within normal limits; recent and remote memory intact  Visuo-spatial:  not examined  Speech (Volume):  normal  Speech:   normal; normal  Thought Process:  Coherent and Relevant  Though Content:  WNL; not  suicidal  Orientation:   person, place, time/date and situation  Judgment:   Fair  Planning:   Fair  Affect:    Appropriate  Mood:    Dysphoric  Insight:   Fair  Intelligence:   normal   Substance Use:  There is a documented history of alcohol, cocaine and tobacco abuse confirmed by the patient.the patient reports that he is a rare and infrequent user of cocaine.  He reports that he had been drinking when an acquaintance came by and offered him cocaine and he used it.  The patient does not try to claim that this is the first time he ever tried cocaine.   Medical History:   Past Medical History:  Diagnosis Date  . GERD (gastroesophageal reflux disease)   . Hypertension   . Nephrolithiasis   . Tobacco abuse    Psychiatric History:  No prior psychiatric history but does admit to long-term use of alcohol as well as tobacco abuse and infrequent use of cocaine.  Family Med/Psych History:  Family History  Problem Relation Age of Onset  . Prostate cancer Father [phone]  . Lung cancer Father   . Nephrolithiasis Father   . Ovarian cancer Mother   . CVA Paternal Grandmother        in her 3050's  . Cancer Paternal Grandmother        eye  . Diabetes Maternal Grandmother   . Alcoholism Maternal Uncle        x 3    Risk of Suicide/Violence: low the patient denies any suicidal or homicidal ideation.  Impression/DX:  [name] [name] is a 52 year old male with history of hypertension, tabacco abuse, alcohol abuse, GERD.  Admitted on 07/27/2017 with left sided weakness.  Patient initially resistant to go to ED.  USD was positive for cocaine.  CTA showed large right MCA (M1) infarct and near occlusive stenosis of right ICA.  Marland Kitchen.  Continued with significant left sided motor deficits.   The patient, during today's clinical interview was clearly stressed about the events that led to a stroke.  The patient did admit to using cocaine prior to the stroke and acknowledges an understanding substance that his  alcohol use which played a role in him using cocaine or primary factors in his stroke.  The patient also understands that his long-term tobacco abuse likely of also played a role in his risk factors for stroke.  The patient reports that while he is not dealing with significant depression he is frustrated and stressed about the residual motor deficits that he is experiencing.   Disposition/Plan:  Worked on issues related to coping and adjustment to residual motor deficits that the patient continues to have.  The patient acknowledges significant frustration with what is happened and worry about his ability to return to work.  Diagnosis:    Substance use/Stroke        Electronically Signed   _______________________ Arley PhenixJohn Sargun Rummell, Psy.D.

## 2017-08-14 NOTE — Progress Notes (Signed)
Physical Therapy Session Note  Patient Details  Name: [name] [name] MRN: [id number] Date of Birth: 1966-01-28  Today's Date: 08/14/2017 PT Individual Time: 6678-5547 PT Individual Time Calculation (min): 30 min   Short Term Goals: Week 2:  PT Short Term Goal 1 (Week 2): Pt will perform bed mobility modI from flat bed PT Short Term Goal 2 (Week 2): Pt will perform stand pivot transfer consistent S PT Short Term Goal 3 (Week 2): Pt will ambulate x150' with consistent min guard PT Short Term Goal 4 (Week 2): Pt will ascend/descent 8 steps min guard  Skilled Therapeutic Interventions/Progress Updates: Pt presented in bed sleeping but easily aroused, but agreeable to therapy. Pt performed stand pivot no AD to w/c. Pt requesting to brush teeth, performed oral hygiene at sink. Transported to rehab gym and performed dynamic balance activities with rec therapist including ball taps with narrow BOS, and staggered stance. Pt required min multimodal cues for engaging LLE for improved wt distribution. Pt able to complete moderate challenges with no LOB. Pt required x 2 brief seated bereaks due to fatigue. Pt transported back to room and requesting to use urinal. Pt performed sit to stand, required minA for clothing management (+void). Pt returned to bed and left with bed alarm on and call bell within reach and needs met.      Therapy Documentation Precautions:  Precautions Precautions: Fall Precaution Comments: L inattention, L hemiparesis Restrictions Weight Bearing Restrictions: No General:   Vital Signs: Therapy Vitals Temp: 98.3 F (36.8 C) Temp Source: Oral Pulse Rate: 72 Resp: 19 BP: 117/75 Patient Position (if appropriate): Lying Oxygen Therapy SpO2: 98 % O2 Device: Room Air Pain: Pain Assessment Pain Scale: 0-10 Pain Score: 0-No pain  See Function Navigator for Current Functional Status.   Therapy/Group: Individual Therapy  Parisa Pinela  Ericka Marcellus,  PTA  08/14/2017, 2:51 PM

## 2017-08-14 NOTE — Progress Notes (Signed)
Occupational Therapy Session Note  Patient Details  Name: [name] [name] MRN: [id number] Date of Birth: 03/14/1965  Today's Date: 08/14/2017 OT Individual Time: [id number] OT Individual Time Calculation (min): 56 min    Short Term Goals: Week 2:  OT Short Term Goal 1 (Week 2): Pt will complete UB dressing with supervision and min instructional cueing for donning pullover shirt.  OT Short Term Goal 2 (Week 2): Pt will complete LB bathing with supervision sit to stand.  OT Short Term Goal 3 (Week 2): Pt will complete walk-in shower transfers with min guard assist using the RW to the tub bench. OT Short Term Goal 4 (Week 2): Pt will complete toilet transfers with min guard assist using the RW to the elevated toilet. OT Short Term Goal 5 (Week 2): Pt will use the LUE as an gross assist for holding items to be opened with no more than mod instructional cueing and mod assist.   Skilled Therapeutic Interventions/Progress Updates:    Pt completed shower and dressing sit to stand during session. He transferred into the shower with min assist and no assistive device. Increased left knee hyperextension noted as well as decreased efficiency for advancing the LLE. Once in the shower he completed bathing sit to stand with min instructional cueing for sequencing and remembering to wash his LUE. Max hand over hand for washing the RUE with the LUE as well as for holding the soap to pour on the washcloth. He completed dressing sit to stand at the sink. Mod demonstrational cueing with min assist for pullover shirt following hemi dressing techniques. He continues to demonstrate difficulty with visual perception and orientation of the shirt.  He was able to donn underpants and pants with min assist sit to stand.  Supervision for donning socks this session using one handed technique.  He also donned his shoes over both feet with supervision as well.  Therapist tightened shoe strings and placed over shoe button.  Pt  left in wheelchair with call button and phone in reach. Chair alarm and safety belt also in place.     Therapy Documentation Precautions:  Precautions Precautions: Fall Precaution Comments: L inattention, L hemiparesis Restrictions Weight Bearing Restrictions: No   Pain: Pain Assessment Pain Scale: Faces Pain Score: 3  Faces Pain Scale: Hurts a little bit Pain Type: Acute pain Pain Location: Shoulder Pain Orientation: Left Pain Descriptors / Indicators: Aching Pain Frequency: Occasional Pain Onset: With Activity Pain Intervention(s): Repositioned ADL: See Function Navigator for Current Functional Status.   Therapy/Group: Individual Therapy  Arkie Tagliaferro OTR/L 08/14/2017, 12:32 PM

## 2017-08-14 NOTE — Progress Notes (Signed)
Physical Therapy Session Note  Patient Details  Name: [name] [name] MRN: [id number] Date of Birth: 07/12/1965  Today's Date: 08/14/2017 PT Individual Time: 1045-1200 PT Individual Time Calculation (min): 75 min   Short Term Goals: Week 2:  PT Short Term Goal 1 (Week 2): Pt will perform bed mobility modI from flat bed PT Short Term Goal 2 (Week 2): Pt will perform stand pivot transfer consistent S PT Short Term Goal 3 (Week 2): Pt will ambulate x150' with consistent min guard PT Short Term Goal 4 (Week 2): Pt will ascend/descent 8 steps min guard  Skilled Therapeutic Interventions/Progress Updates: Denies pain and agreeable to treatment. W/c propulsion RUE/BLE x150' with S and mod cues for technique to integrate LLE into task. Educated pt on treadmill/litegait, goals to increase gait speed closer to typical gait speed to facilitate normal movement patterns, decrease compensation and allow therapist to facilitate at LLE for gait mechanics. Performed x2:15 with total 242' with average speed 1.2 mph. Therapist provided variable cues/facilitation including weight shifting to R to increase L foot clearance, and cues for knee flexion during L swing d/t tendency to swing through straight LE with foot drag. Second trial 2:30 with total [id number] with average speed 1.1 mph; increased tactile cues at L hamstring during swing through. Third trial average speed increased to 1.675mph, only able to tolerate 1:19 d/t fatigue, total 148'. Stand pivot w/c <>mat with S, no AD. Sit >supine>prone with S. Prone on elbows x3 min for WB through LUE. Prone crawling on elbows R/L for continued forced use and WB through LUE. Quadruped RUE reaching to target for continued forced use LUE. Tall kneeling R/L side stepping with cues for L hip extension/glute activation. Standing alternating LE taps to 3" step with minA, min cues for technique L hip/knee flexion and R weight shift when stepping LLE backwards off step. LUE scapular  mobilizations for pain relief, 2x10 AAROM scap retraction and elevation; trace activation with retraction, none with elevation. Gait to return to room no AD and minA/min guard with tactile cues at L glutes for stance control and R weight shift to improve L swing. Pt became easily distracted by surroundings, walking into a staff members office and sitting down to have a conversation, then walking into another empty office, difficulty redirecting to task of returning to room however ultimately reached destination with max cues. Remained seated EOB with setup for lunch; alarm intact, all needs in reach.      Therapy Documentation Precautions:  Precautions Precautions: Fall Precaution Comments: L inattention, L hemiparesis Restrictions Weight Bearing Restrictions: No   See Function Navigator for Current Functional Status.   Therapy/Group: Individual Therapy  Vista Lawmanlizabeth J Tygielski 08/14/2017, 12:07 PM

## 2017-08-14 NOTE — Progress Notes (Signed)
[place] [place] PHYSICAL MEDICINE & REHABILITATION     PROGRESS NOTE  Subjective/Complaints:  Patient seen lying in bed this morning. He states he slept fairly overnight because he dropped his pillow and he bed alarm kept going off. His eyes feel better.  ROS: Denies CP, SOB, nausea, vomiting, diarrhea.  Objective: Vital Signs: Blood pressure 119/88, pulse 72, temperature 98.4 F (36.9 C), temperature source Oral, resp. rate 18, height 5\' 5"  (1.651 m), weight 72.2 kg (159 lb 2.8 oz), SpO2 98 %. No results found. Recent Labs    08/12/17 0738  WBC 10.1  HGB 16.7  HCT 48.4  PLT 290   Recent Labs    08/12/17 0738  NA 143  K 4.2  CL 106  GLUCOSE 100*  BUN 14  CREATININE 0.87  CALCIUM 9.5   CBG (last 3)  No results for input(s): GLUCAP in the last 72 hours.  Wt Readings from Last 3 Encounters:  08/07/17 72.2 kg (159 lb 2.8 oz)  07/27/17 76.3 kg (168 lb 3.2 oz)  09/07/13 72.2 kg (159 lb 2 oz)    Physical Exam:  BP 119/88 (BP Location: Left Arm)   Pulse 72   Temp 98.4 F (36.9 C) (Oral)   Resp 18   Ht 5\' 5"  (1.651 m)   Wt 72.2 kg (159 lb 2.8 oz)   SpO2 98%   BMI 26.49 kg/m  Constitutional: No distress . Vital signs reviewed. HENT: Normocephalic.  Atraumatic. Eyes: EOMI. No discharge. Cardiovascular: RRR. No JVD. Respiratory: CTA Bilaterally. Normal effort. GI: BS +. Non-distended. Musc: No edema or tenderness in extremities. Neurological: He is alert and oriented..  Follows commands Left facial weakness with mild dysarthria persists.  Left upper extremity: Shoulder abduction 1+/5 (some pain inhibition), elbow flexion/extension 3+-4-/5, wrist extension, hand grip 0/5 (stable) Left lower extremity: 4/5 proximal to distal Emerging tone LUE/LLE Skin: Skin is warm and dry. Left axilla improving Psychiatric: His affect is blunt.   Assessment/Plan: 1. Functional deficits secondary to right MCA infarct which require 3+ hours per day of interdisciplinary therapy in a  comprehensive inpatient rehab setting. Physiatrist is providing close team supervision and 24 hour management of active medical problems listed below. Physiatrist and rehab team continue to assess barriers to discharge/monitor patient progress toward functional and medical goals.  Function:  Bathing Bathing position   Position: Shower  Bathing parts Body parts bathed by patient: Chest, Abdomen, Left arm, Front perineal area, Buttocks, Right upper leg, Left upper leg, Right lower leg, Left lower leg Body parts bathed by helper: Right arm, Back  Bathing assist Assist Level: Touching or steadying assistance(Pt > 75%)      Upper Body Dressing/Undressing Upper body dressing   What is the patient wearing?: Pull over shirt/dress     Pull over shirt/dress - Perfomed by patient: Put head through opening, Thread/unthread right sleeve Pull over shirt/dress - Perfomed by helper: Thread/unthread left sleeve, Pull shirt over trunk        Upper body assist Assist Level: (Mod A)      Lower Body Dressing/Undressing Lower body dressing   What is the patient wearing?: Pants, Non-skid slipper socks, Underwear, Shoes, Socks Underwear - Performed by patient: Thread/unthread right underwear leg, Thread/unthread left underwear leg, Pull underwear up/down Underwear - Performed by helper: Pull underwear up/down Pants- Performed by patient: Thread/unthread right pants leg, Thread/unthread left pants leg, Pull pants up/down Pants- Performed by helper: Pull pants up/down Non-skid slipper socks- Performed by patient: Don/doff right sock, Don/doff left  sock Non-skid slipper socks- Performed by helper: Don/doff left sock Socks - Performed by patient: Don/doff right sock Socks - Performed by helper: Don/doff left sock Shoes - Performed by patient: Don/doff right shoe, Don/doff left shoe Shoes - Performed by helper: Fasten right, Fasten left          Lower body assist Assist for lower body dressing: (Mod A  dynamic standing balance, Min A dynamic sitting balance)      Toileting Toileting   Toileting steps completed by patient: Performs perineal hygiene, Adjust clothing prior to toileting, Adjust clothing after toileting Toileting steps completed by helper: Adjust clothing prior to toileting, Adjust clothing after toileting Toileting Assistive Devices: Grab bar or rail  Toileting assist Assist level: Touching or steadying assistance (Pt.75%)   Transfers Chair/bed transfer   Chair/bed transfer method: Stand pivot Chair/bed transfer assist level: Touching or steadying assistance (Pt > 75%) Chair/bed transfer assistive device: Armrests     Locomotion Ambulation     Max distance: 150 Assist level: Touching or steadying assistance (Pt > 75%)   Wheelchair   Type: Manual Max wheelchair distance: 150 Assist Level: Supervision or verbal cues  Cognition Comprehension Comprehension assist level: Understands basic 75 - 89% of the time/ requires cueing 10 - 24% of the time  Expression Expression assist level: Expresses basic 90% of the time/requires cueing < 10% of the time.  Social Interaction Social Interaction assist level: Interacts appropriately 75 - 89% of the time - Needs redirection for appropriate language or to initiate interaction.  Problem Solving Problem solving assist level: Solves basic 50 - 74% of the time/requires cueing 25 - 49% of the time  Memory Memory assist level: Recognizes or recalls 75 - 89% of the time/requires cueing 10 - 24% of the time    Medical Problem List and Plan: 1.  Left inattention and left sided weakness affecting mobility as well as ability to carry out ADL tasks secondary to right MCA infarct.  Cont CIR  WHO/PRAFO   Fluoxetine ordered on 6/4 2.  DVT Prophylaxis/Anticoagulation: Pharmaceutical: Lovenox 3. Pain Management: tylenol prn.  4. Mood: LCSW to follow for evaluation and support.  5. Neuropsych: This patient is capable of making decisions on  his own behalf. 6. Skin/Wound Care: Routine pressure relief measures.   Continue Keflex (6/6-6/12)  7. Fluids/Electrolytes/Nutrition: Monitor I/O.   BMP within normal range on 6/10 8. HTN: Monitor BP bid. Was on Lisinopril/HCTZ 20-25 daily at home  Controlled on 6/12 9. Constipation: Cont bowel meds 10 GERD: Continue Protonix.   11. Tobacco/polysubstance  abuse: Encourage cessation. IS while awake to help with cough.  12. Right ICA stenosis: CEA in 4 weeks 13. Leukocytosis  WBCs 10.1 on 6/10  Afebrile  Continue to monitor 14. Sleep disturbance  Melatonin started on 6/5 increased on 6/7  Trazodone/melatonin timing adjusted to-8 PM per patient request   Improving 15. Dry  Artificial tears started on 6/11  Improving  LOS (Days) 9 A FACE TO FACE EVALUATION WAS PERFORMED  [name] [name] 08/14/2017 9:21 AM

## 2017-08-14 NOTE — Progress Notes (Signed)
Speech Language Pathology Daily Session Note  Patient Details  Name: [name] [name] MRN: [id number] Date of Birth: 10/21/1965  Today's Date: 08/14/2017 SLP Individual Time: [id number] SLP Individual Time Calculation (min): 25 min  Short Term Goals: Week 2: SLP Short Term Goal 1 (Week 2): Pt will use slow rate, overarticulation, and increased vocal intensity to achieve intelligibility at the conversational level in a noisy, distracting environment.  SLP Short Term Goal 2 (Week 2): Pt will recall daily information with min cues for use of memory compensatory strategies.  SLP Short Term Goal 3 (Week 2): Pt will complete semi-complex tasks with min assist verbal cues for functional problem solving  SLP Short Term Goal 4 (Week 2): Pt will locate items to the left of midline during functional tasks for >75% accuracy with supervision verbal cues.  SLP Short Term Goal 5 (Week 2): Pt will selectively attend to task in a moderately distracting environment for 30 minutes with min verbal cues for redirection.   Skilled Therapeutic Interventions:  Pt was seen for skilled ST targeting cognitive goals.  SLP facilitated the session with a previously taught card game which pt was able to recall with min assist question cues.  Pt initially needed mod assist verbal cues to plan and execute a problem solving strategy within task but therapist was able to fade cues to supervision as task progressed.  Pt was left in bed with bed alarm set and call bell within reach.  Continue per current plan of care.    Function:  Eating Eating              Cognition Comprehension Comprehension assist level: Understands basic 90% of the time/cues < 10% of the time  Expression   Expression assist level: Expresses basic needs/ideas: With extra time/assistive device  Social Interaction Social Interaction assist level: Interacts appropriately 90% of the time - Needs monitoring or encouragement for participation or interaction.   Problem Solving Problem solving assist level: Solves basic 75 - 89% of the time/requires cueing 10 - 24% of the time  Memory Memory assist level: Recognizes or recalls 50 - 74% of the time/requires cueing 25 - 49% of the time    Pain Pain Assessment Pain Scale: 0-10 Pain Score: 0-No pain   Therapy/Group: Individual Therapy  Priti Consoli, Melanee SpryNicole L 08/14/2017, 12:46 PM

## 2017-08-14 NOTE — Progress Notes (Signed)
Noted that pt in bed sleeping when not in therapy. Updated sleep time sn 0800.

## 2017-08-15 ENCOUNTER — Inpatient Hospital Stay (HOSPITAL_COMMUNITY): Payer: Managed Care, Other (non HMO) | Admitting: Physical Therapy

## 2017-08-15 ENCOUNTER — Inpatient Hospital Stay (HOSPITAL_COMMUNITY): Payer: Managed Care, Other (non HMO) | Admitting: Speech Pathology

## 2017-08-15 ENCOUNTER — Inpatient Hospital Stay (HOSPITAL_COMMUNITY): Payer: Managed Care, Other (non HMO) | Admitting: Occupational Therapy

## 2017-08-15 MED ORDER — SALINE SPRAY 0.65 % NA SOLN
1.0000 | NASAL | Status: DC | PRN
  Filled 2017-08-15: qty 44

## 2017-08-15 NOTE — Progress Notes (Signed)
[facility] PHYSICAL MEDICINE & REHABILITATION     PROGRESS NOTE  Subjective/Complaints:  Patient seen sitting up in his chair this morning. He states he slept well overnight. He wants me to "make his arm work".  ROS: denies CP, SOB, nausea, vomiting, diarrhea.  Objective: Vital Signs: Blood pressure 125/88, pulse 69, temperature 97.7 F (36.5 C), temperature source Oral, resp. rate 18, height 5\' 5"  (1.651 m), weight 72.2 kg (159 lb 2.8 oz), SpO2 95 %. No results found. No results for input(s): WBC, HGB, HCT, PLT in the last 72 hours. No results for input(s): NA, K, CL, GLUCOSE, BUN, CREATININE, CALCIUM in the last 72 hours.  Invalid input(s): CO CBG (last 3)  No results for input(s): GLUCAP in the last 72 hours.  Wt Readings from Last 3 Encounters:  08/07/17 72.2 kg (159 lb 2.8 oz)  07/27/17 76.3 kg (168 lb 3.2 oz)  09/07/13 72.2 kg (159 lb 2 oz)    Physical Exam:  BP 125/88 (BP Location: Left Arm)   Pulse 69   Temp 97.7 F (36.5 C) (Oral)   Resp 18   Ht 5\' 5"  (1.651 m)   Wt 72.2 kg (159 lb 2.8 oz)   SpO2 95%   BMI 26.49 kg/m  Constitutional: No distress . Vital signs reviewed. HENT: Normocephalic.  Atraumatic. Eyes: EOMI. No discharge. Cardiovascular: RRR. No JVD. Respiratory: CTA Bilaterally. Normal effort. GI: BS +. Non-distended. Musc: No edema or tenderness in extremities. Neurological: He is alert and oriented..  Follows commands Left facial weakness with mild dysarthria persists.  Left upper extremity: Shoulder abduction 1+/5 (some pain inhibition), elbow flexion/extension 3+-4-/5, wrist extension, hand grip 0/5 (unchanged) Left lower extremity: 4/5 proximal to distal Emerging tone LUE/LLE Skin: Skin is warm and dry.  Psychiatric: His affect is blunt.   Assessment/Plan: 1. Functional deficits secondary to right MCA infarct which require 3+ hours per day of interdisciplinary therapy in a comprehensive inpatient rehab setting. Physiatrist is providing close  team supervision and 24 hour management of active medical problems listed below. Physiatrist and rehab team continue to assess barriers to discharge/monitor patient progress toward functional and medical goals.  Function:  Bathing Bathing position   Position: Shower  Bathing parts Body parts bathed by patient: Chest, Abdomen, Left arm, Front perineal area, Buttocks, Right upper leg, Left upper leg, Right lower leg, Left lower leg Body parts bathed by helper: Right arm, Back  Bathing assist Assist Level: Touching or steadying assistance(Pt > 75%)      Upper Body Dressing/Undressing Upper body dressing   What is the patient wearing?: Pull over shirt/dress     Pull over shirt/dress - Perfomed by patient: Put head through opening, Thread/unthread right sleeve, Pull shirt over trunk Pull over shirt/dress - Perfomed by helper: Thread/unthread left sleeve        Upper body assist Assist Level: (Mod A)      Lower Body Dressing/Undressing Lower body dressing   What is the patient wearing?: Pants, Non-skid slipper socks, Underwear, Shoes, Socks Underwear - Performed by patient: Thread/unthread right underwear leg, Thread/unthread left underwear leg, Pull underwear up/down Underwear - Performed by helper: Pull underwear up/down Pants- Performed by patient: Thread/unthread right pants leg, Thread/unthread left pants leg, Pull pants up/down Pants- Performed by helper: Pull pants up/down Non-skid slipper socks- Performed by patient: Don/doff right sock, Don/doff left sock Non-skid slipper socks- Performed by helper: Don/doff left sock Socks - Performed by patient: Don/doff right sock Socks - Performed by helper: Don/doff left sock  Shoes - Performed by patient: Don/doff right shoe, Don/doff left shoe Shoes - Performed by helper: Fasten right, Fasten left          Lower body assist Assist for lower body dressing: (Mod A dynamic standing balance, Min A dynamic sitting balance)       Toileting Toileting   Toileting steps completed by patient: Adjust clothing prior to toileting, Performs perineal hygiene, Adjust clothing after toileting Toileting steps completed by helper: Adjust clothing prior to toileting, Adjust clothing after toileting Toileting Assistive Devices: Grab bar or rail  Toileting assist Assist level: Touching or steadying assistance (Pt.75%)   Transfers Chair/bed transfer   Chair/bed transfer method: Ambulatory Chair/bed transfer assist level: Touching or steadying assistance (Pt > 75%) Chair/bed transfer assistive device: Armrests     Locomotion Ambulation     Max distance: 150 Assist level: Touching or steadying assistance (Pt > 75%)   Wheelchair   Type: Manual Max wheelchair distance: 150 Assist Level: Supervision or verbal cues  Cognition Comprehension Comprehension assist level: Understands basic 90% of the time/cues < 10% of the time  Expression Expression assist level: Expresses basic needs/ideas: With extra time/assistive device  Social Interaction Social Interaction assist level: Interacts appropriately 90% of the time - Needs monitoring or encouragement for participation or interaction.  Problem Solving Problem solving assist level: Solves basic 75 - 89% of the time/requires cueing 10 - 24% of the time  Memory Memory assist level: Recognizes or recalls 50 - 74% of the time/requires cueing 25 - 49% of the time    Medical Problem List and Plan: 1.  Left inattention and left sided weakness affecting mobility as well as ability to carry out ADL tasks secondary to right MCA infarct.  Cont CIR  WHO/PRAFO   Fluoxetine ordered on 6/4 2.  DVT Prophylaxis/Anticoagulation: Pharmaceutical: Lovenox 3. Pain Management: tylenol prn.  4. Mood: LCSW to follow for evaluation and support.  5. Neuropsych: This patient is capable of making decisions on his own behalf. 6. Skin/Wound Care: Routine pressure relief measures.   Keflex completed on  6/12 7. Fluids/Electrolytes/Nutrition: Monitor I/O.   BMP within normal range on 6/10 8. HTN: Monitor BP bid. Was on Lisinopril/HCTZ 20-25 daily at home  Controlled on 6/13 9. Constipation: Cont bowel meds 10 GERD: Continue Protonix.   11. Tobacco/polysubstance  abuse: Encourage cessation. IS while awake to help with cough.  12. Right ICA stenosis: CEA in 4 weeks 13. Leukocytosis  WBCs 10.1 on 6/10  Afebrile  Continue to monitor 14. Sleep disturbance  Melatonin started on 6/5 increased on 6/7  Trazodone/melatonin timing adjusted to-8 PM per patient request   Improving 15. Dry  Artificial tears started on 6/11  Improving  LOS (Days) 10 A FACE TO FACE EVALUATION WAS PERFORMED  Ankit [name] Juba 08/15/2017 8:44 AM

## 2017-08-15 NOTE — Progress Notes (Signed)
Physical Therapy Session Note  Patient Details  Name: [name] [name] MRN: [id number] Date of Birth: 03/26/1965  Today's Date: 08/15/2017 PT Individual Time: 1300-1400 PT Individual Time Calculation (min): 60 min   Short Term Goals: Week 2:  PT Short Term Goal 1 (Week 2): Pt will perform bed mobility modI from flat bed PT Short Term Goal 2 (Week 2): Pt will perform stand pivot transfer consistent S PT Short Term Goal 3 (Week 2): Pt will ambulate x150' with consistent min guard PT Short Term Goal 4 (Week 2): Pt will ascend/descent 8 steps min guard  Skilled Therapeutic Interventions/Progress Updates: Denies pain, agreeable to treatment. Supine>sit with S, HOB elevated. Dons B shoes with minA, cues for use of shoe buttons. Gait to gym minA no AD with cues for attention to task, tactile cues at L glute for stance control and facilitation of L anterior pelvic rotation. 3 sets 10 reps LLE lateral step ups at 6" step with RUE support on rail, min cues to reduce recurvatum. Progressive balance exercises normal BOS eyes open/closed, narrow BOS, semi-tandem, and tandem; requires max cues for sequencing and technique. Dynamic gait with hall rail side stepping and braiding for focus on coordination and hip abduction strengthening. Side stepping no UE support while hitting beach ball back and forth with sister; minA for balance, min cues for technique to reduce dragging LLE. Performed car transfer min guard, gait on ramp, mulch, and curb with R rail and min guard. Educated pt and sister on cognitive dual task cost with ambulation and increased risk of falls; will continue to progress in therapy but needs to be limited otherwise to increase safety. Remained seated on EOB with alarm intact, sister present and all needs in reach at completion of session.      Therapy Documentation Precautions:  Precautions Precautions: Fall Precaution Comments: L inattention, L hemiparesis Restrictions Weight Bearing  Restrictions: No  See Function Navigator for Current Functional Status.   Therapy/Group: Individual Therapy  Vista Lawmanlizabeth J Tygielski 08/15/2017, 3:03 PM

## 2017-08-15 NOTE — Progress Notes (Signed)
Occupational Therapy Session Note  Patient Details  Name: [name] [name] MRN: [id number] Date of Birth: 12/12/1965  Today's Date: 08/15/2017 OT Individual Time: [id number] OT Individual Time Calculation (min): 30 min    Short Term Goals: Week 2:  OT Short Term Goal 1 (Week 2): Pt will complete UB dressing with supervision and min instructional cueing for donning pullover shirt.  OT Short Term Goal 2 (Week 2): Pt will complete LB bathing with supervision sit to stand.  OT Short Term Goal 3 (Week 2): Pt will complete walk-in shower transfers with min guard assist using the RW to the tub bench. OT Short Term Goal 4 (Week 2): Pt will complete toilet transfers with min guard assist using the RW to the elevated toilet. OT Short Term Goal 5 (Week 2): Pt will use the LUE as an gross assist for holding items to be opened with no more than mod instructional cueing and mod assist.   Skilled Therapeutic Interventions/Progress Updates:    Pt seen in the room for session.  Min assist for supine to sit EOB.  Once sitting therapist applied NMES to the left shoulder subluxation.  Intensity set at level 20 with PPS at 35 and pulse width at 300.  Ramp up and down 1 second with on time/off time at 10 seconds as well.  Pt worked on activation of the left arm in weightbearing during session, with therapist placing it on the bed and having him push up from left lean to sitting.  Mod facilitation needed to complete this without activation of head flexion to the right to assist with movement.  Had pt also work on activation of shoulder flexion and elbow extension/flexion in sitting.  Pt tolerated 15 mins of activation without any adverse reactions.  Finished session with pt back in the bed with call button and phone in reach and bed alarm in place.      Therapy Documentation Precautions:  Precautions Precautions: Fall Precaution Comments: L inattention, L hemiparesis Restrictions Weight Bearing Restrictions: No    Pain: No report of pain during session.  See Function Navigator for Current Functional Status.   Therapy/Group: Individual Therapy  Esti Demello OTR/L 08/15/2017, 3:37 PM

## 2017-08-15 NOTE — Progress Notes (Signed)
Occupational Therapy Session Note  Patient Details  Name: [name] [name] MRN: [id number] Date of Birth: 08/20/1965  Today's Date: 08/15/2017 OT Individual Time: 1005-1100 OT Individual Time Calculation (min): 55 min    Short Term Goals: Week 2:  OT Short Term Goal 1 (Week 2): Pt will complete UB dressing with supervision and min instructional cueing for donning pullover shirt.  OT Short Term Goal 2 (Week 2): Pt will complete LB bathing with supervision sit to stand.  OT Short Term Goal 3 (Week 2): Pt will complete walk-in shower transfers with min guard assist using the RW to the tub bench. OT Short Term Goal 4 (Week 2): Pt will complete toilet transfers with min guard assist using the RW to the elevated toilet. OT Short Term Goal 5 (Week 2): Pt will use the LUE as an gross assist for holding items to be opened with no more than mod instructional cueing and mod assist.   Skilled Therapeutic Interventions/Progress Updates:    Pt completed shower and dressing sit to stand during session. He transferred into the shower with min assist and no assistive device. Increased left knee hyperextension noted as well as decreased efficiency for advancing the LLE. Once in the shower he completed bathing sit to stand with min instructional cueing for sequencing and remembering to wash his arms and chest. Max hand over hand for washing the RUE with the LUE.  He demonstrated one LOB posteriorly when standing in the shower when attempting to wash and dry his buttocks.  He ambulated out to the wheelchair with min guard assist for dressing at the sink once showering was completed.  Mod demonstrational cueing with min assist for pullover shirt following hemi dressing techniques. He continues to demonstrate decreased ability to orient the shirt.  He was able to donn underpants and pants with min assist sit to stand, however he needed 2 attempts to complete donning his shorts as on the first attempt he placed both  LEs into the same leg hole.  Upon standing and pulling them up over his hips, he still did not notice the error, min questioning cueing from therapist for him to notice and then correct.  He finished with grooming tasks of brushing his hair and his teeth before returning to bed with min guard assist.     Therapy Documentation Precautions:  Precautions Precautions: Fall Precaution Comments: L inattention, L hemiparesis Restrictions Weight Bearing Restrictions: No   Pain: Pain Assessment Pain Scale: 0-10 Pain Score: 0-No pain ADL: See Function Navigator for Current Functional Status.   Therapy/Group: Individual Therapy  Shota Kohrs OTR/L 08/15/2017, 12:43 PM

## 2017-08-15 NOTE — Progress Notes (Signed)
Speech Language Pathology Daily Session Note  Patient Details  Name: [name] [name] MRN: [id number] Date of Birth: 07/03/1965  Today's Date: 08/15/2017 SLP Individual Time: 9528-41320833-0930 SLP Individual Time Calculation (min): 57 min  Short Term Goals: Week 2: SLP Short Term Goal 1 (Week 2): Pt will use slow rate, overarticulation, and increased vocal intensity to achieve intelligibility at the conversational level in a noisy, distracting environment.  SLP Short Term Goal 2 (Week 2): Pt will recall daily information with min cues for use of memory compensatory strategies.  SLP Short Term Goal 3 (Week 2): Pt will complete semi-complex tasks with min assist verbal cues for functional problem solving  SLP Short Term Goal 4 (Week 2): Pt will locate items to the left of midline during functional tasks for >75% accuracy with supervision verbal cues.  SLP Short Term Goal 5 (Week 2): Pt will selectively attend to task in a moderately distracting environment for 30 minutes with min verbal cues for redirection.   Skilled Therapeutic Interventions:  Pt was seen for skilled ST targeting cognitive goals.  SLP facilitated the session with medication management task to measure progress from initial attempt.  Pt demonstrated less impulsivity when organizing pills into a pill box as evidenced by pt only needing min-supervision cues.  Pt still needed mod assist to recognize and correct errors when they occurred.  SLP attempted mildly complex word problems to address problem solving goals; however, pt needed max to total assist to complete task due to decreased working memory and decreased attention task and pt became frustrated, stating "I guess I'm just a dumb ass now.  That stroke really fucked me up."  SLP provided assurance and encouragement by providing examples of his progress as well as rationale for Interventions while on CIR.  Pt was returned to room and left in bed with bed alarm set and call bell within reach.   Continue per current plan of care.    Function:  Eating Eating                 Cognition Comprehension Comprehension assist level: Follows basic conversation/direction with extra time/assistive device  Expression   Expression assist level: Expresses basic needs/ideas: With extra time/assistive device  Social Interaction Social Interaction assist level: Interacts appropriately 75 - 89% of the time - Needs redirection for appropriate language or to initiate interaction.  Problem Solving Problem solving assist level: Solves basic 50 - 74% of the time/requires cueing 25 - 49% of the time  Memory Memory assist level: Recognizes or recalls 50 - 74% of the time/requires cueing 25 - 49% of the time    Pain Pain Assessment Pain Scale: 0-10 Pain Score: 0-No pain   Therapy/Group: Individual Therapy  Aaron Bostwick, Melanee SpryNicole L 08/15/2017, 9:35 AM

## 2017-08-15 NOTE — Progress Notes (Signed)
Pt has remained awake during therapy and while sister visiting.  As soon as family left, pt asleep, bed alarm on with SR up x 2 and call bell in reach. Sleep chart updated and will continue to monitor.

## 2017-08-16 ENCOUNTER — Inpatient Hospital Stay (HOSPITAL_COMMUNITY): Payer: Managed Care, Other (non HMO) | Admitting: Occupational Therapy

## 2017-08-16 ENCOUNTER — Inpatient Hospital Stay (HOSPITAL_COMMUNITY): Payer: Managed Care, Other (non HMO) | Admitting: Physical Therapy

## 2017-08-16 ENCOUNTER — Inpatient Hospital Stay (HOSPITAL_COMMUNITY): Payer: Managed Care, Other (non HMO) | Admitting: Speech Pathology

## 2017-08-16 ENCOUNTER — Inpatient Hospital Stay (HOSPITAL_COMMUNITY): Payer: Managed Care, Other (non HMO)

## 2017-08-16 NOTE — Progress Notes (Signed)
Speech Language Pathology Daily Session Note  Patient Details  Name: [name] [name] MRN: [id number] Date of Birth: 11/23/1965  Today's Date: 08/16/2017 SLP Individual Time: 1305-1400 SLP Individual Time Calculation (min): 55 min  Short Term Goals: Week 2: SLP Short Term Goal 1 (Week 2): Pt will use slow rate, overarticulation, and increased vocal intensity to achieve intelligibility at the conversational level in a noisy, distracting environment.  SLP Short Term Goal 2 (Week 2): Pt will recall daily information with min cues for use of memory compensatory strategies.  SLP Short Term Goal 3 (Week 2): Pt will complete semi-complex tasks with min assist verbal cues for functional problem solving  SLP Short Term Goal 4 (Week 2): Pt will locate items to the left of midline during functional tasks for >75% accuracy with supervision verbal cues.  SLP Short Term Goal 5 (Week 2): Pt will selectively attend to task in a moderately distracting environment for 30 minutes with min verbal cues for redirection.   Skilled Therapeutic Interventions:  Pt was seen for skilled ST targeting speech intelligibility goals.  Pt was received upright in recliner, awake, alert, and agreeable to participate in ST.  SLP facilitated the session with a verbal description task using a visual barrier to address carryover of intelligibility strategies.  SLP also utilized background noise and music to further increase task challenge.  Pt needed min cues to slow rate and increase vocal intensity to achieve intelligibility at the conversational level.  Pt was returned to room and left in wheelchair with quick release belt donned.  Continue per current plan of care.   Function:  Eating Eating                 Cognition Comprehension Comprehension assist level: Follows basic conversation/direction with extra time/assistive device  Expression   Expression assist level: Expresses basic 75 - 89% of the time/requires cueing 10 -  24% of the time. Needs helper to occlude trach/needs to repeat words.  Social Interaction Social Interaction assist level: Interacts appropriately 75 - 89% of the time - Needs redirection for appropriate language or to initiate interaction.  Problem Solving Problem solving assist level: Solves basic 75 - 89% of the time/requires cueing 10 - 24% of the time  Memory Memory assist level: Recognizes or recalls 75 - 89% of the time/requires cueing 10 - 24% of the time    Pain Pain Assessment Pain Scale: 0-10 Pain Score: 0-No pain   Therapy/Group: Individual Therapy  Malikai Gut, Melanee SpryNicole L 08/16/2017, 2:02 PM

## 2017-08-16 NOTE — Progress Notes (Signed)
Social Work Patient ID: [name] [name] Comment, male   DOB: 01-19-1966, 52 y.o.   MRN: [id number]  Met with pt and sister yesterday to review team conference.  Informed that team reports pt at min assist level overall but max cues for safety and that 24/7 care is recommended.  Sister reports that family is unable to provide this at this time.  She and pt request SW pursue SNF placement if insurance will agree to cover.  She is aware from [facility], LCSW that this may not be approved but will start the request. D/c plan changed to SNF  Bethanne Mule, LCSW

## 2017-08-16 NOTE — Progress Notes (Signed)
Occupational Therapy Session Note  Patient Details  Name: [name] [name] MRN: [id number] Date of Birth: 10/30/1965  Today's Date: 08/16/2017 OT Individual Time: [id number] OT Individual Time Calculation (min): 59 min    Short Term Goals: Week 2:  OT Short Term Goal 1 (Week 2): Pt will complete UB dressing with supervision and min instructional cueing for donning pullover shirt.  OT Short Term Goal 2 (Week 2): Pt will complete LB bathing with supervision sit to stand.  OT Short Term Goal 3 (Week 2): Pt will complete walk-in shower transfers with min guard assist using the RW to the tub bench. OT Short Term Goal 4 (Week 2): Pt will complete toilet transfers with min guard assist using the RW to the elevated toilet. OT Short Term Goal 5 (Week 2): Pt will use the LUE as an gross assist for holding items to be opened with no more than mod instructional cueing and mod assist.   Skilled Therapeutic Interventions/Progress Updates:     Pt completed shower and dressing sit to stand during session. He transferred into the shower with min assist and no assistive device. Once in the shower hecompleted bathing sit to stand with min instructional cueing for sequencing and remembering to wash his arms and chest. Max hand over hand for washing the RUE with the LUE.  He ambulated out to the wheelchair with min guard assist for dressing at the sink once showering was completed.  Mod demonstrational cueing with mod assist for pullover shirt following hemi dressing techniques. He continues to demonstrate decreased ability to orient the shirt.  He was able to donn underpants and pants with min assist sit to stand, however he needed 2 attempts to complete donning his shorts as on the first attempt he donned the left leg in the right leg hole. Upon standing and pulling them up over his hips, he still did not notice the error, min questioning cueing from therapist for him to notice and then correct.  He finished with  grooming tasks of brushing his hair and his teeth before transferring into the wheelchair to eat lunch.  Pt left in wheelchair with call button and phone in reach.  Chair alarm and safety belt also in place.      Therapy Documentation Precautions:  Precautions Precautions: Fall Precaution Comments: L inattention, L hemiparesis Restrictions Weight Bearing Restrictions: No   Pain: Pain Assessment Faces Pain Scale: Hurts a little bit Pain Type: Acute pain Pain Location: Shoulder Pain Orientation: Left Pain Descriptors / Indicators: Discomfort Pain Onset: With Activity Pain Intervention(s): Repositioned;Emotional support ADL: See Function Navigator for Current Functional Status.   Therapy/Group: Individual Therapy  Burr Soffer OTR/L 08/16/2017, 12:48 PM

## 2017-08-16 NOTE — Progress Notes (Addendum)
Ord PHYSICAL MEDICINE & REHABILITATION     PROGRESS NOTE  Subjective/Complaints:  Patient seen lying in bed this morning. He states he slept well overnight, confirmed with sleep chart.  ROS: denies CP, SOB, nausea, vomiting, diarrhea.  Objective: Vital Signs: Blood pressure 111/62, pulse 74, temperature 98.3 F (36.8 C), temperature source Oral, resp. rate 20, height 5\' 5"  (1.651 m), weight 72.2 kg (159 lb 2.8 oz), SpO2 100 %. No results found. No results for input(s): WBC, HGB, HCT, PLT in the last 72 hours. No results for input(s): NA, K, CL, GLUCOSE, BUN, CREATININE, CALCIUM in the last 72 hours.  Invalid input(s): CO CBG (last 3)  No results for input(s): GLUCAP in the last 72 hours.  Wt Readings from Last 3 Encounters:  08/07/17 72.2 kg (159 lb 2.8 oz)  07/27/17 76.3 kg (168 lb 3.2 oz)  09/07/13 72.2 kg (159 lb 2 oz)    Physical Exam:  BP 111/62 (BP Location: Left Arm)   Pulse 74   Temp 98.3 F (36.8 C) (Oral)   Resp 20   Ht 5\' 5"  (1.651 m)   Wt 72.2 kg (159 lb 2.8 oz)   SpO2 100%   BMI 26.49 kg/m  Constitutional: No distress . Vital signs reviewed. HENT: Normocephalic.  Atraumatic. Eyes: EOMI. No discharge. Cardiovascular: RRR. No JVD. Respiratory: CTA Bilaterally. Normal effort. GI: BS +. Non-distended. Musc: No edema or tenderness in extremities. Neurological: He is alert and oriented..  Follows commands Left facial weakness with mild dysarthria persists.  Left upper extremity: Shoulder abduction 1+/5 (some pain inhibition), elbow flexion/extension 3+-4-/5, wrist extension, hand grip 0/5 (stable) Left lower extremity: 4/5 proximal to distal Emerging tone LUE/LLE Skin: Skin is warm and dry.  Psychiatric: His affect is blunt.   Assessment/Plan: 1. Functional deficits secondary to right MCA infarct which require 3+ hours per day of interdisciplinary therapy in a comprehensive inpatient rehab setting. Physiatrist is providing close team supervision  and 24 hour management of active medical problems listed below. Physiatrist and rehab team continue to assess barriers to discharge/monitor patient progress toward functional and medical goals.  Function:  Bathing Bathing position   Position: Shower  Bathing parts Body parts bathed by patient: Chest, Abdomen, Left arm, Front perineal area, Buttocks, Right upper leg, Left upper leg, Right lower leg, Left lower leg Body parts bathed by helper: Right arm, Back  Bathing assist Assist Level: Supervision or verbal cues      Upper Body Dressing/Undressing Upper body dressing   What is the patient wearing?: Pull over shirt/dress     Pull over shirt/dress - Perfomed by patient: Put head through opening, Thread/unthread right sleeve, Pull shirt over trunk Pull over shirt/dress - Perfomed by helper: Thread/unthread left sleeve        Upper body assist Assist Level: (Mod A)      Lower Body Dressing/Undressing Lower body dressing   What is the patient wearing?: Pants, Non-skid slipper socks, Underwear, Shoes, Socks Underwear - Performed by patient: Thread/unthread right underwear leg, Thread/unthread left underwear leg, Pull underwear up/down Underwear - Performed by helper: Pull underwear up/down Pants- Performed by patient: Thread/unthread right pants leg, Thread/unthread left pants leg, Pull pants up/down Pants- Performed by helper: Pull pants up/down Non-skid slipper socks- Performed by patient: Don/doff right sock, Don/doff left sock Non-skid slipper socks- Performed by helper: Don/doff left sock Socks - Performed by patient: Don/doff right sock Socks - Performed by helper: Don/doff left sock Shoes - Performed by patient: Don/doff right shoe,  Don/doff left shoe Shoes - Performed by helper: Fasten right, Fasten left          Lower body assist Assist for lower body dressing: Touching or steadying assistance (Pt > 75%)      Toileting Toileting   Toileting steps completed by  patient: Performs perineal hygiene, Adjust clothing prior to toileting, Adjust clothing after toileting Toileting steps completed by helper: Adjust clothing prior to toileting, Adjust clothing after toileting Toileting Assistive Devices: Grab bar or rail  Toileting assist Assist level: Touching or steadying assistance (Pt.75%)   Transfers Chair/bed transfer   Chair/bed transfer method: Ambulatory Chair/bed transfer assist level: Touching or steadying assistance (Pt > 75%) Chair/bed transfer assistive device: Armrests     Locomotion Ambulation     Max distance: 150 Assist level: Touching or steadying assistance (Pt > 75%)   Wheelchair   Type: Manual Max wheelchair distance: 150 Assist Level: Supervision or verbal cues  Cognition Comprehension Comprehension assist level: Understands basic 75 - 89% of the time/ requires cueing 10 - 24% of the time  Expression Expression assist level: Expresses basic 90% of the time/requires cueing < 10% of the time.  Social Interaction Social Interaction assist level: Interacts appropriately 75 - 89% of the time - Needs redirection for appropriate language or to initiate interaction.  Problem Solving Problem solving assist level: Solves basic 50 - 74% of the time/requires cueing 25 - 49% of the time  Memory Memory assist level: Recognizes or recalls 50 - 74% of the time/requires cueing 25 - 49% of the time    Medical Problem List and Plan: 1.  Left inattention and left sided weakness affecting mobility as well as ability to carry out ADL tasks secondary to right MCA infarct.  Cont CIR, plan for SNF  WHO/PRAFO   Fluoxetine ordered on 6/4 2.  DVT Prophylaxis/Anticoagulation: Pharmaceutical: Lovenox 3. Pain Management: tylenol prn.  4. Mood: LCSW to follow for evaluation and support.  5. Neuropsych: This patient is capable of making decisions on his own behalf. 6. Skin/Wound Care: Routine pressure relief measures.   Keflex completed on 6/12 7.  Fluids/Electrolytes/Nutrition: Monitor I/O.   BMP within normal range on 6/10 8. HTN: Monitor BP bid. Was on Lisinopril/HCTZ 20-25 daily at home  Elevated overnight, otherwise controlled, will monitor for trend 9. Constipation: Cont bowel meds 10 GERD: Continue Protonix.   11. Tobacco/polysubstance  abuse: Encourage cessation. IS while awake to help with cough.  12. Right ICA stenosis: CEA in 4 weeks 13. Leukocytosis: Resolved  WBCs 10.1 on 6/10  Afebrile  Continue to monitor 14. Sleep disturbance  Melatonin started on 6/5 increased on 6/7  Trazodone/melatonin timing adjusted to-8 PM per patient request   Improving 15. Dry  Artificial tears started on 6/11  Improving  LOS (Days) 11 A FACE TO FACE EVALUATION WAS PERFORMED  Myriah Boggus [name] [name] Branson Kranz 08/16/2017 8:39 AM

## 2017-08-16 NOTE — Patient Care Conference (Signed)
Inpatient RehabilitationTeam Conference and Plan of Care Update Date: 08/14/2017   Time: 2:10 PM    Patient Name: [name] [name]      Medical Record Number: [id number]  Date of Birth: 02/25/1966 Sex: Male         Room/Bed: 4M04C/4M04C-01 Payor Info: Payor: [employer] / Plan: [employer] MANAGED / Product Type: *No Product type* /    Admitting Diagnosis: R CVA  Admit Date/Time:  08/05/2017  3:16 PM Admission Comments: No comment available   Primary Diagnosis:  <principal problem not specified> Principal Problem: <principal problem not specified>  Patient Active Problem List   Diagnosis Date Noted  . Dry eyes   . Labile blood pressure   . Sleep disturbance   . Leukocytosis   . Internal carotid artery stenosis, right   . Cerebral edema (HCC) 08/05/2017  . Hyperlipidemia 08/05/2017  . Tobacco use disorder 08/05/2017  . Family history of stroke 08/05/2017  . Acute ischemic right MCA stroke (HCC) 08/05/2017  . Cerebral infarction due to embolism of right middle cerebral artery (HCC)   . Benign essential HTN   . Slow transit constipation   . Gastroesophageal reflux disease   . Carotid stenosis 08/04/2017  . Cocaine abuse (HCC) 08/04/2017  . Acute encephalopathy   . Essential hypertension   . 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 Staghorn calculus 08/24/2013  . Hemorrhoids, internal, with bleeding and Grade 2 prolapse 08/24/2013    Expected Discharge Date: Expected Discharge Date: (vs SNF)  Team Members Present: Physician leading conference: Dr. Maryla MorrowAnkit Patel Social Worker Present: Amada JupiterLucy Kajal Scalici, LCSW Nurse Present: Willey BladeKaren Winter, RN PT Present: Alyson ReedyElizabeth Tygielski, PT OT Present: Perrin MalteseJames McGuire, OT SLP Present: Jackalyn LombardNicole Page, SLP PPS Coordinator present : Tora DuckMarie Noel, RN, CRRN     Current Status/Progress Goal Weekly Team Focus  Medical   Left inattention and left sided weakness affecting mobility as well as ability to carry out ADL tasks secondary to  right MCA infarct.  Improve mobility, BP, sleep  See above   Bowel/Bladder   Continent of B/B  Maintain continence  Assist with toileting as needed   Swallow/Nutrition/ Hydration             ADL's   Pt completes UB bathing with min assist and dressing with min assist.  LB bathing min assist as well with min assist for LB dressing.  Decreased left attention and initiation at times.  Brunnstrum stage  II in the left hand and stage II-III in the left arm.  He completes toilet and shower transfers with min assist.   supervision overall  selfcare retraining, balance retraining, transfer training, neuromuscular re-education, therapeutic exercise, pt/family education   Mobility   S bed mobility and transfers, minA gait and stairs  S overall  L attention/NMR, standing balance, activity tolerance   Communication   not addressed this past reporting period to allow focus on cognition   mod I   continue to work towards education and carryover of compensatory strategies    Safety/Cognition/ Behavioral Observations  min-mod assist, improved scanning    supervision   continue to address problem solving, safety awareness, memory    Pain   Denies pain  Pain < 2  Assess Qshift and PRN   Skin   Skin intact  Maintain skin integrity  Assess Qshift and PRN    Rehab Goals Patient on target to meet rehab goals: Yes *See Care Plan and progress notes for long and short-term  goals.     Barriers to Discharge  Current Status/Progress Possible Resolutions Date Resolved   Physician    Medical stability;Decreased caregiver support;Lack of/limited family support     See above  Therapies, sleep improving, optimize BP meds, follow labs      Nursing                  PT                    OT                  SLP Decreased caregiver support              SW                Discharge Planning/Teaching Needs:  Sister is trying to come up with a 24 hr care plan, due to all family members work. Checking with  uncle and aunt. Aware pt will need atleast supervision at discharge.      Team Discussion:  MD monitoring white count/ temp;  Cont b/b.  Min assist b/d and min/mod UB b/d.  Poor safety awareness overall;  Slight shoulder movement.  Left inattention requiring max cues.  Min-guard tfs/ amb/ with max cues for safety.  Revisions to Treatment Plan:  Likely change in d/c plan.    Continued Need for Acute Rehabilitation Level of Care: The patient requires daily medical management by a physician with specialized training in physical medicine and rehabilitation for the following conditions: Daily direction of a multidisciplinary physical rehabilitation program to ensure safe treatment while eliciting the highest outcome that is of practical value to the patient.: Yes Daily medical management of patient stability for increased activity during participation in an intensive rehabilitation regime.: Yes Daily analysis of laboratory values and/or radiology reports with any subsequent need for medication adjustment of medical intervention for : Blood pressure problems;Other;Neurological problems  [name] 08/16/2017, 11:22 AM

## 2017-08-16 NOTE — Progress Notes (Signed)
Occupational Therapy Session Note  Patient Details  Name: [name] [name] MRN: [id number] Date of Birth: 02/13/1966  Today's Date: 08/16/2017 OT Individual Time: 4098-11911416-1447 OT Individual Time Calculation (min): 31 min    Short Term Goals: Week 2:  OT Short Term Goal 1 (Week 2): Pt will complete UB dressing with supervision and min instructional cueing for donning pullover shirt.  OT Short Term Goal 2 (Week 2): Pt will complete LB bathing with supervision sit to stand.  OT Short Term Goal 3 (Week 2): Pt will complete walk-in shower transfers with min guard assist using the RW to the tub bench. OT Short Term Goal 4 (Week 2): Pt will complete toilet transfers with min guard assist using the RW to the elevated toilet. OT Short Term Goal 5 (Week 2): Pt will use the LUE as an gross assist for holding items to be opened with no more than mod instructional cueing and mod assist.   Skilled Therapeutic Interventions/Progress Updates:    Pt completed transfer from the wheelchair down to the therapy mat with min guard assist.  He then transitioned to supine with min assist to the left side.  Worked on AROM LUE elbow extension in supine, isolated with therapist stabilizing the shoulder at 90 degrees flexion while pt activated elbow extension.  He was able to demonstrate active movement.  Also used dowel rod with ace bandage wrapped around the left hand to keep grip to work on shoulder flexion with elbow extension.  Pt demonstrated decreased sustained attention to the LUE at times, requiring mod instructional cueing to maintain focus.  When he attended he could elicit some active shoulder flexion with elbow extension.  Finished session with return to the room and pt transferring to the bed with min guard assist.  Call button and phone in reach and bed alarm in place.    Therapy Documentation Precautions:  Precautions Precautions: Fall Precaution Comments: L inattention, L hemiparesis Restrictions Weight  Bearing Restrictions: No  Pain: Pain Assessment Pain Scale: 0-10 Pain Score: 0-No pain Faces Pain Scale: Hurts a little bit Pain Type: Acute pain Pain Location: Shoulder Pain Orientation: Left Pain Descriptors / Indicators: Discomfort Pain Onset: With Activity Pain Intervention(s): Repositioned ADL: See Function Navigator for Current Functional Status.   Therapy/Group: Individual Therapy  Alissandra Geoffroy OTR/L 08/16/2017, 3:41 PM

## 2017-08-16 NOTE — Progress Notes (Signed)
Physical Therapy Session Note  Patient Details  Name: [name] [name] MRN: [id number] Date of Birth: 11/20/1965  Today's Date: 08/16/2017 PT Individual Time: 0955-1100 PT Individual Time Calculation (min): 65 min   Short Term Goals: Week 2:  PT Short Term Goal 1 (Week 2): Pt will perform bed mobility modI from flat bed PT Short Term Goal 2 (Week 2): Pt will perform stand pivot transfer consistent S PT Short Term Goal 3 (Week 2): Pt will ambulate x150' with consistent min guard PT Short Term Goal 4 (Week 2): Pt will ascend/descent 8 steps min guard  Skilled Therapeutic Interventions/Progress Updates:    Pt received on toilet. Pt requires steadying assist for functional dynamic standing balance while pulling up pants and verbal cues for attention to L side of body repeatedly. Functional gait in room with steadying assist and performed hand and oral hygiene at sink in standing with close supervision to steadying assist, cues for attention to LUE throughout. Donned shoes seated EOB to prepare for therapy out of room with extra time and verbal cues. Gait training on unit with RW and L hand orthosis and without AD to challenge balance with PT providing HHA on L for feedback (overall min assist) on unit with verbal cues for increasing L heel strike and focusing on control in L knee in stance phase. NMR during blocked practice sit <> stands and mini squats and holds with 4" step under RLE with max verbal cues needed to perform correctly (pt wanting to only use RLE) and verbal and visual cues for positioning of trunk/shoulders (maintaings L shoulder rotated forward). NMR on Biodex (limits of stability and random control programs) with BUE support (forced weightbearing through LUE) on non-compliant and compliant (level 9 and level 7) surfaces to focus on weightshifting, postural control re-training and balance). Nustep for reciprocal movement pattern retraining of BLE and BUE with LUE hand adaptive piece x 5 min  on level 4.  Therapy Documentation Precautions:  Precautions Precautions: Fall Precaution Comments: L inattention, L hemiparesis Restrictions Weight Bearing Restrictions: No    Pain: No complaints.   See Function Navigator for Current Functional Status.   Therapy/Group: Individual Therapy  Karolee StampsGray, Tarris Delbene Darrol PokeBrescia  Kadesha Virrueta B. Floyed Masoud, PT, DPT  08/16/2017, 11:38 AM

## 2017-08-17 ENCOUNTER — Inpatient Hospital Stay (HOSPITAL_COMMUNITY): Payer: Managed Care, Other (non HMO)

## 2017-08-17 ENCOUNTER — Inpatient Hospital Stay (HOSPITAL_COMMUNITY): Payer: Managed Care, Other (non HMO) | Admitting: Physical Therapy

## 2017-08-17 NOTE — Progress Notes (Addendum)
Occupational Therapy Session Note  Patient Details  Name: [name] [name] MRN: [id number] Date of Birth: 02/16/1966  Today's Date: 08/17/2017 OT Individual Time: [id number] OT Individual Time Calculation (min): 45 min    Short Term Goals: Week 2:  OT Short Term Goal 1 (Week 2): Pt will complete UB dressing with supervision and min instructional cueing for donning pullover shirt.  OT Short Term Goal 2 (Week 2): Pt will complete LB bathing with supervision sit to stand.  OT Short Term Goal 3 (Week 2): Pt will complete walk-in shower transfers with min guard assist using the RW to the tub bench. OT Short Term Goal 4 (Week 2): Pt will complete toilet transfers with min guard assist using the RW to the elevated toilet. OT Short Term Goal 5 (Week 2): Pt will use the LUE as an gross assist for holding items to be opened with no more than mod instructional cueing and mod assist.   Skilled Therapeutic Interventions/Progress Updates:    Pt resting in bed upon arrival with family present.  Pt agreeable to therapy and sat EOB at supervision level.  Pt impulsively stood up and performed stand pivot transfer to w/c.  Educated pt on safety guidelines. Pt transitioned to gym and transferred to mat.  OT intervention with focus on LUE NMR, NMES for wrist and finger flexion, sitting balance, attention to L, and LUE activities with focus on movement isolation.  Pt requires max verbal cues to attend to his LUE and elicit movement.  Pt noted with elbow flexion/extension with elbow supported, horizontal adduction, and shoulder flexion/extension.  NMES applied to elicit L wrist/finger flexion with minimal results. Pt c/o increased L shoulder pain with abduction and flexion. One finger anterior subluxation noted and Kinesio Tape applied.  Pt returned to room and remained in w/c with family present.   1:1 NMES applied to L wrist and finger flexors to elicit wrist/finger flexion  Ratio 1:3 Rate 35 pps Waveform-  Asymmetric Ramp 1.0 Pulse 300 Intensity- 15 Duration - 10 mins     Minimal results.  Pt unable to tolerate greater intensity No adverse reactions after treatment and is skin intact.    Therapy Documentation Precautions:  Precautions Precautions: Fall Precaution Comments: L inattention, L hemiparesis Restrictions Weight Bearing Restrictions: No Pain:  Pt c/o increased L shoulder pain with abduction; repositioned and Kinesio Tape applied  See Function Navigator for Current Functional Status.   Therapy/Group: Individual Therapy  Rich BraveLanier, [name] 08/17/2017, 2:39 PM

## 2017-08-17 NOTE — Progress Notes (Signed)
[facility] PHYSICAL MEDICINE & REHABILITATION     PROGRESS NOTE  Subjective/Complaints:  Patient has no complaints.  Patient was walking with assistance when I saw him and then sat in bed.  He denies any pain.  He is feels well.  Objective: Vital Signs: Blood pressure (!) 152/91, pulse 71, temperature 98.3 F (36.8 C), temperature source Oral, resp. rate 16, height 5\' 5"  (1.651 m), weight 159 lb 2.8 oz (72.2 kg), SpO2 97 %.  HEENT exam atraumatic, normocephalic, extractor muscles are intact. Chest clear to auscultation Cardiac exam S1-S2 regular Abdominal exam active bowel sounds, soft, nontender Extremities without edema.   Assessment/Plan: 1. Functional deficits secondary to right MCA infarct  Medical Problem List and Plan: 1.  Right MCA infarct.  Impacting mobility likely causing some inattention.  Discharge is for skilled facility. 2.  DVT Prophylaxis/Anticoagulation: Pharmaceutical: Lovenox 3. Pain Management: tylenol prn.  4. Mood: LCSW to follow for evaluation and support.  5. Neuropsych: This patient is capable of making decisions on his own behalf. 6. Skin/Wound Care: Routine pressure relief measures.   Keflex completed on 6/12 7. Fluids/Electrolytes/Nutrition: Monitor I/O.   BMP within normal range on 6/10 8. HTN: We will continue to monitor.  Max blood pressure 152/91.  9. Constipation: Resolved. 10 GERD: Continue Protonix.   11. Tobacco/polysubstance  abuse: Encourage cessation. IS while awake to help with cough.  12. Right ICA stenosis: CEA in 4 weeks 13. Leukocytosis: Resolved  WBCs 10.1 on 6/10  Afebrile  Continue to monitor 14. Sleep disturbance  Melatonin started on 6/5 increased on 6/7  Trazodone/melatonin timing adjusted to-8 PM per patient request   Improving 15. Dry  Artificial tears started on 6/11  Improving  LOS (Days) 12 A FACE TO FACE EVALUATION WAS PERFORMED  Bruce H Swords 08/17/2017 9:56 AM

## 2017-08-18 ENCOUNTER — Inpatient Hospital Stay (HOSPITAL_COMMUNITY): Payer: Managed Care, Other (non HMO) | Admitting: Occupational Therapy

## 2017-08-18 NOTE — Progress Notes (Signed)
Occupational Therapy Session Note  Patient Details  Name: [name] [name] MRN: [id number] Date of Birth: 04-30-1965  Today's Date: 08/18/2017 OT Individual Time: 1500-1600 OT Individual Time Calculation (min): 60 min    Short Term Goals: Week 1:  OT Short Term Goal 1 (Week 1): Pt will complete UB dressing with supervision and min instructional cueing for donning pullover shirt.  OT Short Term Goal 1 - Progress (Week 1): Not met OT Short Term Goal 2 (Week 1): Pt will complete LB bathing with supervision sit to stand.  OT Short Term Goal 2 - Progress (Week 1): Not met OT Short Term Goal 3 (Week 1): Pt will complete toilet transfers with min guard assist using the RW to the elevated toilet. OT Short Term Goal 3 - Progress (Week 1): Not met OT Short Term Goal 4 (Week 1): Pt will complete walk-in shower transfers with min guard assist using the RW to the tub bench. OT Short Term Goal 4 - Progress (Week 1): Not met OT Short Term Goal 5 (Week 1): Pt will use the LUE as an gross assist for holding items to be opened with no more than mod instructional cueing and min assist.  OT Short Term Goal 5 - Progress (Week 1): Not met Week 2:  OT Short Term Goal 1 (Week 2): Pt will complete UB dressing with supervision and min instructional cueing for donning pullover shirt.  OT Short Term Goal 2 (Week 2): Pt will complete LB bathing with supervision sit to stand.  OT Short Term Goal 3 (Week 2): Pt will complete walk-in shower transfers with min guard assist using the RW to the tub bench. OT Short Term Goal 4 (Week 2): Pt will complete toilet transfers with min guard assist using the RW to the elevated toilet. OT Short Term Goal 5 (Week 2): Pt will use the LUE as an gross assist for holding items to be opened with no more than mod instructional cueing and mod assist.  Week 3:     Skilled Therapeutic Interventions/Progress Updates:    Pt sitting in wc with family upon OT arrival.  Agreed to therapy.  Pt rolled  self in wc to gym with 3 rest breaks and mod cues for sustained attention.  Transferred to mat with stand pivot and CGA.  Pt sat EOB with cues to retract scapula while performing LUE NMRE.  Performed About 30 degrees for AAROM for elbow flexion and extension for 30 seconds.  Did NMRE to shoulder, elbow and hand.  Performed weight bearing activities in LUE while doing isotonic exercises with RUE.  Pt had occasional pain in LUE with exercises but would only last for few seconds.  Practiced stretch and hold to LUE with shoulder abduction, and flexion to 30 degrees.  Pt transitioned to wc and propelled self back to room.  Performed toilet transfer to standard toilet with CGA.  Pt used grab bar while adjusting pants in standing position with CGA.  Pt completed BM and cleaned self with min assist for thoroughness.  Transferred to wc and washed hands at sink with SBA.   Left pt in wc with LUE resting on tray table for positioning. Chair alarm on and sister present.  Therapy Documentation Precautions:  Precautions Precautions: Fall Precaution Comments: L inattention, L hemiparesis Restrictions Weight Bearing Restrictions: No   Vital Signs: Therapy Vitals Temp: 98.2 F (36.8 C) Temp Source: Oral Pulse Rate: 78 Resp: 18 BP: (!) 142/98 Patient Position (if appropriate): Lying Oxygen Therapy  SpO2: 98 % O2 Device: Room Air Pain: occasional pain during exercises but not sustaining     Perception:  decreased attention to left environment.        Exercises:  Isometric/isotonic therapeutic activities    See Function Navigator for Current Functional Status.   Therapy/Group: Individual Therapy  [name], [name] 08/18/2017, 4:28 PM

## 2017-08-18 NOTE — Progress Notes (Signed)
[facility] PHYSICAL MEDICINE & REHABILITATION     PROGRESS NOTE  Subjective/Complaints:  Patient feels well.  He denies any significant complaints.  He does have a sense of his left arm hanging when he gets up to sit or walk.  He is requesting some sort of sling.  Objective: Vital Signs: Blood pressure 137/89, pulse 71, temperature 98.1 F (36.7 C), temperature source Oral, resp. rate 16, height 5\' 5"  (1.651 m), weight 159 lb 2.8 oz (72.2 kg), SpO2 94 %.  No acute distress.  HEENT exam atraumatic, normocephalic Neck is supple Chest clear to auscultation Cardiac exam S1-S2 regular Abdominal exam active bowel sounds, soft Extremities without edema.   Assessment/Plan: 1. Functional deficits secondary to right MCA infarct  Medical Problem List and Plan: 1.  Right MCA infarct.  Impacting mobility likely causing some inattention.  Discharge is for skilled facility. 2.  DVT Prophylaxis/Anticoagulation: Pharmaceutical: Lovenox 3. Pain Management: tylenol prn.  4. Mood: LCSW to follow for evaluation and support.  5. Neuropsych: This patient is capable of making decisions on his own behalf. 6. Skin/Wound Care: Routine pressure relief measures.  Keflex completed on June 12.   7. Fluids/Electrolytes/Nutrition: Monitor I/O.   BMP within normal range on 6/10 8. HTN: Continue current medications.  Blood pressure well controlled.  9. Constipation: Resolved. 10 GERD: Continue Protonix.   11. Tobacco/polysubstance  abuse: Encourage cessation. IS while awake to help with cough.  12. Right ICA stenosis: CEA in 4 weeks 13. Leukocytosis: Resolved.   14. Sleep disturbance  Improved. 15. Dry  Artificial tears started on 6/11  Improving 16.  I told him that a decision will be made about an arm sling next week.  LOS (Days) 13 A FACE TO FACE EVALUATION WAS PERFORMED   H  08/18/2017 8:42 AM

## 2017-08-19 ENCOUNTER — Inpatient Hospital Stay (HOSPITAL_COMMUNITY): Payer: Managed Care, Other (non HMO) | Admitting: Speech Pathology

## 2017-08-19 ENCOUNTER — Inpatient Hospital Stay (HOSPITAL_COMMUNITY): Payer: Managed Care, Other (non HMO)

## 2017-08-19 ENCOUNTER — Inpatient Hospital Stay (HOSPITAL_COMMUNITY): Payer: Managed Care, Other (non HMO) | Admitting: Occupational Therapy

## 2017-08-19 DIAGNOSIS — M25512 Pain in left shoulder: Secondary | ICD-10-CM

## 2017-08-19 DIAGNOSIS — G8114 Spastic hemiplegia affecting left nondominant side: Secondary | ICD-10-CM

## 2017-08-19 DIAGNOSIS — I639 Cerebral infarction, unspecified: Secondary | ICD-10-CM

## 2017-08-19 LAB — BASIC METABOLIC PANEL
ANION GAP: 8 (ref 5–15)
BUN: 9 mg/dL (ref 6–20)
CHLORIDE: 108 mmol/L (ref 101–111)
CO2: 26 mmol/L (ref 22–32)
CREATININE: 0.96 mg/dL (ref 0.61–1.24)
Calcium: 8.9 mg/dL (ref 8.9–10.3)
GFR calc Af Amer: >60 mL/min (ref >60)
GFR calc non Af Amer: >60 mL/min (ref >60)
GLUCOSE: 101 mg/dL — AB (ref 65–99)
Potassium: 4 mmol/L (ref 3.5–5.1)
Sodium: 142 mmol/L (ref 135–145)

## 2017-08-19 LAB — CBC
HCT: 45.9 % (ref 39.0–52.0)
HEMOGLOBIN: 15.7 g/dL (ref 13.0–17.0)
MCH: 33 pg (ref 26.0–34.0)
MCHC: 34.2 g/dL (ref 30.0–36.0)
MCV: 96.4 fL (ref 78.0–100.0)
Platelets: 268 10*3/uL (ref 150–400)
RBC: 4.76 MIL/uL (ref 4.22–5.81)
RDW: 11.1 % — ABNORMAL LOW (ref 11.5–15.5)
WBC: 9.3 10*3/uL (ref 4.0–10.5)

## 2017-08-19 MED ORDER — HYDROCHLOROTHIAZIDE 12.5 MG PO CAPS
12.5000 mg | ORAL_CAPSULE | Freq: Every day | ORAL | Status: DC
  Administered 2017-08-19 – 2017-08-21 (×3): 12.5 mg via ORAL
  Filled 2017-08-19 (×3): qty 1

## 2017-08-19 NOTE — NC FL2 (Signed)
[place] MEDICAID FL2 LEVEL OF CARE SCREENING TOOL     IDENTIFICATION  Patient Name: [name] [name] Birthdate: December 07, 1965 Sex: male Admission Date (Current Location): 08/05/2017  [facility] and [place] Number:  Producer, television/film/video and Address:  [facility], [address], [place], [place] [address]      Provider Number: [id number]  Attending Physician Name and Address:  Marcello Fennel, MD  Relative Name and Phone Number:  Nyoka Cowden [phone]    Current Level of Care: Other (Comment)(rehab ) Recommended Level of Care: Skilled Nursing Facility Prior Approval Number:    Date Approved/Denied:   PASRR Number: [id number]  Discharge Plan: SNF    Current Diagnoses: Patient Active Problem List   Diagnosis Date Noted  . Dry eyes   . Labile blood pressure   . Sleep disturbance   . Leukocytosis   . Internal carotid artery stenosis, right   . Cerebral edema (HCC) 08/05/2017  . Hyperlipidemia 08/05/2017  . Tobacco use disorder 08/05/2017  . Family history of stroke 08/05/2017  . Acute ischemic right MCA stroke (HCC) 08/05/2017  . Cerebral infarction due to embolism of right middle cerebral artery (HCC)   . Benign essential HTN   . Slow transit constipation   . Gastroesophageal reflux disease   . Carotid stenosis 08/04/2017  . Cocaine abuse (HCC) 08/04/2017  . Acute encephalopathy   . Essential hypertension   . 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 Staghorn calculus 08/24/2013  . Hemorrhoids, internal, with bleeding and Grade 2 prolapse 08/24/2013    Orientation RESPIRATION BLADDER Height & Weight     Self, Time, Situation, Place  Normal Continent Weight: 159 lb 2.8 oz (72.2 kg) Height:  5\' 5"  (165.1 cm)  BEHAVIORAL SYMPTOMS/MOOD NEUROLOGICAL BOWEL NUTRITION STATUS      Continent Diet(regular diet)  AMBULATORY STATUS COMMUNICATION OF NEEDS Skin   Limited Assist Verbally Normal                        Personal Care Assistance Level of Assistance  Bathing, Dressing Bathing Assistance: Limited assistance   Dressing Assistance: Limited assistance     Functional Limitations Info  Speech     Speech Info: Adequate    SPECIAL CARE FACTORS FREQUENCY  PT (By licensed PT), OT (By licensed OT), Speech therapy     PT Frequency: 5x week OT Frequency: 5x week     Speech Therapy Frequency: 5x week      Contractures Contractures Info: Not present    Additional Factors Info  Code Status, Allergies Code Status Info: Full Code Allergies Info: No known allergies           Current Medications (08/19/2017):  This is the current hospital active medication list Current Facility-Administered Medications  Medication Dose Route Frequency Provider Last Rate Last Dose  . acetaminophen (TYLENOL) tablet 325-650 mg  325-650 mg Oral Q4H PRN Jacquelynn Cree, PA-C   650 mg at 08/16/17 8469  . alum & mag hydroxide-simeth (MAALOX/MYLANTA) 200-200-20 MG/5ML suspension 30 mL  30 mL Oral Q4H PRN Jacquelynn Cree, PA-C   30 mL at 08/19/17 1006  . bisacodyl (DULCOLAX) suppository 10 mg  10 mg Rectal Daily PRN Love, Pamela S, PA-C      . diphenhydrAMINE (BENADRYL) 12.5 MG/5ML elixir 12.5-25 mg  12.5-25 mg Oral Q6H PRN Jacquelynn Cree, PA-C   25 mg at 08/09/17 0104  . enoxaparin (  LOVENOX) injection 40 mg  40 mg Subcutaneous Q24H Marcello FennelPatel, Ankit Anil, MD   40 mg at 08/18/17 1719  . FLUoxetine (PROZAC) capsule 10 mg  10 mg Oral Q2000 Marcello FennelPatel, Ankit Anil, MD   10 mg at 08/18/17 2013  . folic acid (FOLVITE) tablet 1 mg  1 mg Oral Daily Love, Evlyn Kanneramela S, PA-C   1 mg at 08/19/17 0847  . guaiFENesin-dextromethorphan (ROBITUSSIN DM) 100-10 MG/5ML syrup 5-10 mL  5-10 mL Oral Q6H PRN Love, Pamela S, PA-C      . hydrochlorothiazide (MICROZIDE) capsule 12.5 mg  12.5 mg Oral Daily Kirsteins, Victorino SparrowAndrew E, MD   12.5 mg at 08/19/17 0847  . Melatonin TABS 3 mg  3 mg Oral Q2000 Marcello FennelPatel, Ankit Anil, MD   3 mg at 08/18/17  2014  . multivitamin with minerals tablet 1 tablet  1 tablet Oral Daily Jacquelynn CreeLove, Pamela S, PA-C   1 tablet at 08/19/17 0847  . nicotine (NICODERM CQ - dosed in mg/24 hours) patch 14 mg  14 mg Transdermal QPC supper Marcello FennelPatel, Ankit Anil, MD   14 mg at 08/18/17 1719  . pantoprazole (PROTONIX) EC tablet 40 mg  40 mg Oral Daily Jacquelynn CreeLove, Pamela S, PA-C   40 mg at 08/19/17 0847  . polyethylene glycol (MIRALAX / GLYCOLAX) packet 17 g  17 g Oral Daily PRN Love, Pamela S, PA-C      . polyvinyl alcohol (LIQUIFILM TEARS) 1.4 % ophthalmic solution 1 drop  1 drop Both Eyes PRN Marcello FennelPatel, Ankit Anil, MD   1 drop at 08/16/17 0928  . prochlorperazine (COMPAZINE) tablet 5-10 mg  5-10 mg Oral Q6H PRN Love, Pamela S, PA-C       Or  . prochlorperazine (COMPAZINE) injection 5-10 mg  5-10 mg Intramuscular Q6H PRN Love, Pamela S, PA-C       Or  . prochlorperazine (COMPAZINE) suppository 12.5 mg  12.5 mg Rectal Q6H PRN Love, Pamela S, PA-C      . senna-docusate (Senokot-S) tablet 2 tablet  2 tablet Oral Q2000 Marcello FennelPatel, Ankit Anil, MD   2 tablet at 08/18/17 2013  . sodium chloride (OCEAN) 0.65 % nasal spray 1 spray  1 spray Each Nare PRN Love, Pamela S, PA-C      . sodium phosphate (FLEET) 7-19 GM/118ML enema 1 enema  1 enema Rectal Once PRN Love, Pamela S, PA-C      . traZODone (DESYREL) tablet 50 mg  50 mg Oral Q2000 Marcello FennelPatel, Ankit Anil, MD   50 mg at 08/18/17 2014     Discharge Medications: Please see discharge summary for a list of discharge medications.  Relevant Imaging Results:  Relevant Lab Results:   Additional Information SSN: [id number]  Jasminne Mealy, Lemar LivingsRebecca G, LCSW

## 2017-08-19 NOTE — Plan of Care (Signed)
  Problem: Consults Goal: RH STROKE PATIENT EDUCATION Description See Patient Education module for education specifics  Outcome: Progressing   Problem: RH BOWEL ELIMINATION Goal: RH STG MANAGE BOWEL WITH ASSISTANCE Description STG Manage Bowel with  Mod I Assistance.  Outcome: Progressing Flowsheets (Taken 08/19/2017 1247) STG: Pt will manage bowels with assistance: 6-Modified independent Goal: RH STG MANAGE BOWEL W/MEDICATION W/ASSISTANCE Description STG Manage Bowel with Medication with mod I  Assistance.  Outcome: Progressing Flowsheets (Taken 08/19/2017 1247) STG: Pt will manage bowels with medication with assistance: 6-Modified independent   Problem: RH SAFETY Goal: RH STG ADHERE TO SAFETY PRECAUTIONS W/ASSISTANCE/DEVICE Description STG Adhere to Safety Precautions With cues/supervision Assistance/Device.  Outcome: Progressing Flowsheets (Taken 08/19/2017 1247) STG:Pt will adhere to safety precautions with assistance/device: 4-Minimal assistance   Problem: RH KNOWLEDGE DEFICIT Goal: RH STG INCREASE KNOWLEDGE OF HYPERTENSION Description Pt will be able to explain medication regimen and diet restrictions to manage hypertension and secondary stroke prevention with cues/reminders/resources  Outcome: Progressing   Problem: RH Vision Goal: RH LTG Vision (Specify) Outcome: Progressing

## 2017-08-19 NOTE — Progress Notes (Signed)
Speech Language Pathology Daily Session Note  Patient Details  Name: [name] [name] MRN: [id number] Date of Birth: 06/05/1965  Today's Date: 08/19/2017 SLP Individual Time: 1030-1100 SLP Individual Time Calculation (min): 30 min  Short Term Goals: Week 2: SLP Short Term Goal 1 (Week 2): Pt will use slow rate, overarticulation, and increased vocal intensity to achieve intelligibility at the conversational level in a noisy, distracting environment.  SLP Short Term Goal 2 (Week 2): Pt will recall daily information with min cues for use of memory compensatory strategies.  SLP Short Term Goal 3 (Week 2): Pt will complete semi-complex tasks with min assist verbal cues for functional problem solving  SLP Short Term Goal 4 (Week 2): Pt will locate items to the left of midline during functional tasks for >75% accuracy with supervision verbal cues.  SLP Short Term Goal 5 (Week 2): Pt will selectively attend to task in a moderately distracting environment for 30 minutes with min verbal cues for redirection.   Skilled Therapeutic Interventions:Skilled ST services focused on cognitive skills. SLP facilitated recall and problem solving in novel game, pt required mod A fading to min A verbal cues to recall self-associated symbol and word and recall 5 items previously named. Pt demonstrated ability to scan left in functional tasks with supervision A question cues. Pt was left in room with call bell within reach. Pt demonstrated intelligible speech in conversation in a quiet environment. Reccomend to continue skilled ST services.       Function:  Eating Eating   Modified Consistency Diet: No Eating Assist Level: Set up assist for   Eating Set Up Assist For: Opening containers;Cutting food       Cognition Comprehension Comprehension assist level: Follows basic conversation/direction with no assist  Expression   Expression assist level: Expresses basic needs/ideas: With no assist  Social Interaction  Social Interaction assist level: Interacts appropriately 90% of the time - Needs monitoring or encouragement for participation or interaction.  Problem Solving Problem solving assist level: Solves basic problems with no assist  Memory Memory assist level: Recognizes or recalls 75 - 89% of the time/requires cueing 10 - 24% of the time;Recognizes or recalls 50 - 74% of the time/requires cueing 25 - 49% of the time    Pain Pain Assessment Pain Score: 0-No pain  Therapy/Group: Individual Therapy  [name]  [facility] 08/19/2017, 12:45 PM

## 2017-08-19 NOTE — Progress Notes (Signed)
Social Work Patient ID: [name] GandyEdward D Aull, male   DOB: 01/27/1966, 52 y.o.   MRN: [id number]  Spoke with sister who reports they feel pt needs to go to a NH before going home for more therapies. Pt is in agreement and wants to pursue this option. Made aware insurance may not cover this but still want to pursue this. Will start NH process.

## 2017-08-19 NOTE — Progress Notes (Signed)
[facility] [place] [facility] PHYSICAL MEDICINE & REHABILITATION     PROGRESS NOTE  Subjective/Complaints:   No issues overnite Pt is c/o Left shouldr pain post CVA.  Pt is laying on Left side discussed proper bed positioning Orient to person place not time ?Wed  ROS: denies CP, SOB, nausea, vomiting, diarrhea.  Objective: Vital Signs: Blood pressure (!) 144/95, pulse 65, temperature 97.6 F (36.4 C), temperature source Oral, resp. rate 16, height 5\' 5"  (1.651 m), weight 72.2 kg (159 lb 2.8 oz), SpO2 97 %. No results found. Recent Labs    08/19/17 0607  WBC 9.3  HGB 15.7  HCT 45.9  PLT 268   No results for input(s): NA, K, CL, GLUCOSE, BUN, CREATININE, CALCIUM in the last 72 hours.  Invalid input(s): CO CBG (last 3)  No results for input(s): GLUCAP in the last 72 hours.  Wt Readings from Last 3 Encounters:  08/07/17 72.2 kg (159 lb 2.8 oz)  07/27/17 76.3 kg (168 lb 3.2 oz)  09/07/13 72.2 kg (159 lb 2 oz)    Physical Exam:  BP (!) 144/95 (BP Location: Left Arm)   Pulse 65   Temp 97.6 F (36.4 C) (Oral)   Resp 16   Ht 5\' 5"  (1.651 m)   Wt 72.2 kg (159 lb 2.8 oz)   SpO2 97%   BMI 26.49 kg/m  Constitutional: No distress . Vital signs reviewed. HENT: Normocephalic.  Atraumatic. Eyes: EOMI. No discharge. Cardiovascular: RRR. No JVD. Respiratory: CTA Bilaterally. Normal effort. GI: BS +. Non-distended. Musc: No edema or tenderness in extremities. Neurological: He is alert and oriented..  Follows commands Left facial weakness with mild dysarthria persists.  Left upper extremity: Shoulder abduction 1+/5 (some pain inhibition), elbow flexion/extension 3+-4-/5, wrist extension, hand grip 0/5 (stable) Left lower extremity: 4/5 proximal to distal Emerging tone LUE/LLE Skin: Skin is warm and dry.  Psychiatric: His affect is blunt.   Assessment/Plan: 1. Functional deficits secondary to right MCA infarct which require 3+ hours per day of interdisciplinary therapy in a comprehensive  inpatient rehab setting. Physiatrist is providing close team supervision and 24 hour management of active medical problems listed below. Physiatrist and rehab team continue to assess barriers to discharge/monitor patient progress toward functional and medical goals.  Function:  Bathing Bathing position   Position: Shower  Bathing parts Body parts bathed by patient: Chest, Abdomen, Left arm, Front perineal area, Buttocks, Right upper leg, Left upper leg, Right lower leg, Left lower leg Body parts bathed by helper: Back, Right arm  Bathing assist Assist Level: Supervision or verbal cues      Upper Body Dressing/Undressing Upper body dressing   What is the patient wearing?: Pull over shirt/dress     Pull over shirt/dress - Perfomed by patient: Put head through opening, Thread/unthread right sleeve, Pull shirt over trunk Pull over shirt/dress - Perfomed by helper: Thread/unthread left sleeve        Upper body assist Assist Level: (Mod A)      Lower Body Dressing/Undressing Lower body dressing   What is the patient wearing?: Pants, Non-skid slipper socks, Underwear, Shoes, Socks Underwear - Performed by patient: Thread/unthread right underwear leg, Thread/unthread left underwear leg, Pull underwear up/down Underwear - Performed by helper: Pull underwear up/down Pants- Performed by patient: Thread/unthread right pants leg, Thread/unthread left pants leg, Pull pants up/down Pants- Performed by helper: Pull pants up/down Non-skid slipper socks- Performed by patient: Don/doff right sock, Don/doff left sock Non-skid slipper socks- Performed by helper: Don/doff left sock Socks -  Performed by patient: Don/doff right sock Socks - Performed by helper: Don/doff left sock Shoes - Performed by patient: Don/doff right shoe, Don/doff left shoe, Fasten right, Fasten left Shoes - Performed by helper: Fasten right, Fasten left          Lower body assist Assist for lower body dressing: Touching  or steadying assistance (Pt > 75%)      Toileting Toileting   Toileting steps completed by patient: Adjust clothing prior to toileting, Performs perineal hygiene Toileting steps completed by helper: Adjust clothing after toileting Toileting Assistive Devices: Grab bar or rail  Toileting assist Assist level: Touching or steadying assistance (Pt.75%)   Transfers Chair/bed transfer   Chair/bed transfer method: Ambulatory Chair/bed transfer assist level: Touching or steadying assistance (Pt > 75%) Chair/bed transfer assistive device: Armrests     Locomotion Ambulation     Max distance: 150 Assist level: Touching or steadying assistance (Pt > 75%)   Wheelchair   Type: Manual Max wheelchair distance: 150 Assist Level: Touching or steadying assistance (Pt > 75%)  Cognition Comprehension Comprehension assist level: Follows basic conversation/direction with no assist  Expression Expression assist level: Expresses basic needs/ideas: With no assist  Social Interaction Social Interaction assist level: Interacts appropriately 90% of the time - Needs monitoring or encouragement for participation or interaction.  Problem Solving Problem solving assist level: Solves basic problems with no assist  Memory Memory assist level: Recognizes or recalls 90% of the time/requires cueing < 10% of the time    Medical Problem List and Plan: 1.  Left inattention and left sided weakness affecting mobility as well as ability to carry out ADL tasks secondary to right MCA infarct.  Cont CIR PT, OT, SLP, plan for SNF  WHO/PRAFO   Fluoxetine ordered on 6/4 2.  DVT Prophylaxis/Anticoagulation: Pharmaceutical: Lovenox PLT nl 6/17 3. Pain Management: tylenol prn.  4. Mood: LCSW to follow for evaluation and support.  5. Neuropsych: This patient is capable of making decisions on his own behalf. 6. Skin/Wound Care: Routine pressure relief measures.   Keflex completed on 6/12 7. Fluids/Electrolytes/Nutrition:  Monitor I/O.   BMP within normal range on 6/10 8. HTN: Monitor BP bid. Was on Lisinopril/HCTZ 20-25 daily at home   Vitals:   08/18/17 2020 08/19/17 0421  BP: (!) 144/98 (!) 144/95  Pulse: 80 65  Resp: 18 16  Temp: 97.8 F (36.6 C) 97.6 F (36.4 C)  SpO2: 95% 97%  elevated diastolic start HCTZ 12.5 on 6/17 9. Constipation: Cont bowel meds 10 GERD: Continue Protonix.   11. Tobacco/polysubstance  abuse: Encourage cessation. IS while awake to help with cough.  12. Right ICA stenosis: CEA in 4 weeks 13. Leukocytosis: Resolved  WBCs 10.1 on 6/10  Afebrile  Continue to monitor 14. Sleep disturbance  Melatonin started on 6/5 increased on 6/7  Trazodone/melatonin timing adjusted to-8 PM per patient request   Improving 15. Dry eyes  Artificial tears started on 6/11  Improving  LOS (Days) 14 A FACE TO FACE EVALUATION WAS PERFORMED  Erick Colace 08/19/2017 7:13 AM

## 2017-08-19 NOTE — Progress Notes (Signed)
Occupational Therapy Session Note  Patient Details  Name: [name] [name] MRN: [id number] Date of Birth: 11/21/1965  Today's Date: 08/19/2017 OT Individual Time: 1101-1155 OT Individual Time Calculation (min): 54 min    Short Term Goals: Week 2:  OT Short Term Goal 1 (Week 2): Pt will complete UB dressing with supervision and min instructional cueing for donning pullover shirt.  OT Short Term Goal 2 (Week 2): Pt will complete LB bathing with supervision sit to stand.  OT Short Term Goal 3 (Week 2): Pt will complete walk-in shower transfers with min guard assist using the RW to the tub bench. OT Short Term Goal 4 (Week 2): Pt will complete toilet transfers with min guard assist using the RW to the elevated toilet. OT Short Term Goal 5 (Week 2): Pt will use the LUE as an gross assist for holding items to be opened with no more than mod instructional cueing and mod assist.   Skilled Therapeutic Interventions/Progress Updates:    Pt completed bathing and dressing during session.  Mod instructional cueing to initiate completing undressing and taking shower secondary to decreased attention.  Once in the bathroom he sat on a 3:1 to remove his shirt and socks.  He stood with min guard assist to remove his underpants and shorts.  Once completed, he stepped into the shower and began rinsing off.  Min instructional cueing to sit for safety as well as min instructional cueing to remember to wash the LUE.  Dressing completed at EOB with min guard assist.  Pt ambulated with close supervision over to the sink for applying deodorant and for combing his hair.  He needed min assist for crossing and maintaining the LLE over the right leg to donn his socks.  Pt transitioned to supine at end of session with supervision.  Bed alarm in place and LUE positioned on pillows.  Call button and phone in reach.    Therapy Documentation Precautions:  Precautions Precautions: Fall Precaution Comments: L inattention, L  hemiparesis Restrictions Weight Bearing Restrictions: No  Pain: Pain Assessment Pain Scale: Faces Pain Score: 0-No pain Faces Pain Scale: Hurts a little bit Pain Type: Acute pain Pain Location: Shoulder Pain Orientation: Left Pain Descriptors / Indicators: Discomfort Pain Onset: With Activity Pain Intervention(s): Repositioned ADL: See Function Navigator for Current Functional Status.   Therapy/Group: Individual Therapy  Sharla Tankard OTR/L 08/19/2017, 12:47 PM

## 2017-08-19 NOTE — Progress Notes (Addendum)
Physical Therapy Session Note  Patient Details  Name: [name] [name] MRN: [id number] Date of Birth: [birth year]  Today's Date: 08/19/2017 PT Individual Time: 0800-0900 PT Individual Time Calculation (min): 60 min   Short Term Goals: Week 1:  PT Short Term Goal 1 (Week 1): Pt will perform bed mobility S from flat bed PT Short Term Goal 1 - Progress (Week 1): Met PT Short Term Goal 2 (Week 1): Pt will transfer w/c <>bed with consistent minA PT Short Term Goal 2 - Progress (Week 1): Met PT Short Term Goal 3 (Week 1): pt wll ambulate x75' with LRAD and minA PT Short Term Goal 3 - Progress (Week 1): Met PT Short Term Goal 4 (Week 1): Pt will ascend/descent 8 steps minA  PT Short Term Goal 4 - Progress (Week 1): Met Week 2:  PT Short Term Goal 1 (Week 2): Pt will perform bed mobility modI from flat bed PT Short Term Goal 2 (Week 2): Pt will perform stand pivot transfer consistent S PT Short Term Goal 3 (Week 2): Pt will ambulate x150' with consistent min guard PT Short Term Goal 4 (Week 2): Pt will ascend/descent 8 steps min guard Week 3:     Skilled Therapeutic Interventions/Progress Updates:   Pt asleep but easily awakened.  He sat EOB to eat breakfast, slowly and safely; he declined attmepting to use his L hand for gross assist.  PT opened containers.  Pt attended to L side of tray with 1 cue. Pt reported that he had had diarrhea yesterday, and felt that his stomach was getting upset.  Gait in room without AD to /from toilet and sink with close supervision, min guard assist. Pt continent voiding; no BM.   neuromuscular re-education via multimodal cues and demo for standing with bil UE support for 10 x 1 mini squats, calf raises, toe raises; seated without back support for 10 x 1 bil toe raises, bil calf raises, R/L heel/shin slides, blil glut sets. Pt has minimal activation of L glutes.   Reciprocal scooting to activate pelvic/core muscles.  Pt has very poor activation on L, even with manual  cues.    Pt left resting in recliner with all needs at hand, and seat alarm set.  Therapy Documentation Precautions:  Precautions Precautions: Fall Precaution Comments: L inattention, L hemiparesis Restrictions Weight Bearing Restrictions: No  Pain: none per pt   tx 2;  1415-1505 50 min individual tx Pain: none per pt  Pt asleep in bed but easily awakened.  Bed mobility training using  hemi techniques to roll R, blocked practice.  Pt with limited retention of techiques.  Seated EOB, pt donned shirt with mod assist after many attempts to find L armhole. Pt has limited patience for extra time and effort needed to dress, cursing and tearing at shirt eventually to get it over his head.   Pt donned shoes as above.  Gait wihtout AD to BR for voiding.  Pt without results, attempted in standing.  Gait room > gym without AD, but pt holding onto railings and counters wherever avaiable. Gait to return to room pushing grocery cart for AD.  Pt disliked this, stating his L wrist hurt despite PT supporting his hand on push handle of cart.   neuromuscular re-education for balance challenge and sustained stretch bil heel cords in snding on wedge wthout AD.  Pt tolerated x 2 minutes without LOB.  Forced use , biased standing during R step/taps onto 4" high stool, min guard  assist for balance; biased to L sit>< stand with R foot on red Disc-O-Sit, x 4 x 2 with good extension LLE.   See Function Navigator for Current Functional Status.   Therapy/Group: Individual Therapy  [name] [name] 08/19/2017, 12:34 PM

## 2017-08-20 ENCOUNTER — Inpatient Hospital Stay (HOSPITAL_COMMUNITY): Payer: Managed Care, Other (non HMO) | Admitting: Physical Therapy

## 2017-08-20 ENCOUNTER — Inpatient Hospital Stay (HOSPITAL_COMMUNITY): Payer: Managed Care, Other (non HMO) | Admitting: Occupational Therapy

## 2017-08-20 ENCOUNTER — Inpatient Hospital Stay (HOSPITAL_COMMUNITY): Payer: Managed Care, Other (non HMO) | Admitting: Speech Pathology

## 2017-08-20 MED ORDER — ALUM & MAG HYDROXIDE-SIMETH 200-200-20 MG/5ML PO SUSP: 30.0000 mL | ORAL | 0 refills | Status: DC | PRN

## 2017-08-20 MED ORDER — MELATONIN 3 MG PO TABS: 3.0000 mg | ORAL_TABLET | Freq: Every day | ORAL | 0 refills | Status: DC

## 2017-08-20 MED ORDER — ATORVASTATIN CALCIUM 40 MG PO TABS
40.0000 mg | ORAL_TABLET | Freq: Every day | ORAL | Status: DC
  Administered 2017-08-20: 40 mg via ORAL
  Filled 2017-08-20: qty 1

## 2017-08-20 NOTE — Progress Notes (Signed)
Occupational Therapy Discharge Summary  Patient Details  Name: [name] [name] MRN: [id number] Date of Birth: 03/04/1966     Patient has met 9 of 13 long term goals due to improved balance, ability to compensate for deficits, improved attention, improved awareness and improved coordination.  Patient to discharge at overall Supervision level.  Patient's care partner is independent to provide the necessary physical and cognitive assistance at discharge.    Reasons goals not met: Pt needs mod to max assist for awareness, memory, and for LUE functional use.  Meal prep was not addressed and pt will need min assist for safety based on decreased awareness during meal prep tasks.    Recommendation:  Patient will benefit from ongoing skilled OT services in home health setting to continue to advance functional skills in the area of BADL and Reduce care partner burden.  Pt is still a huge safety risk at this time secondary to left hemiparesis and decreased awareness and attention to his limitations and how they affect his ADL performance.  Feel he is a high fall risk as well and will need initial 24 hour supervision for safety until he can progress further with HHOT.    Equipment: shower seat is recommended  Reasons for discharge: treatment goals met and discharge from hospital  Patient/family agrees with progress made and goals achieved: Yes  OT Discharge Precautions/Restrictions  Precautions Precautions: Fall Precaution Comments: L inattention, L hemiparesis, decreased awareness Restrictions Weight Bearing Restrictions: No   Vital Signs Therapy Vitals Pulse Rate: 79 Resp: 20 BP: (!) 150/84 Patient Position (if appropriate): Lying Oxygen Therapy SpO2: 96 % O2 Device: Room Air Pain Pain Assessment Pain Scale: Faces Faces Pain Scale: Hurts a little bit Pain Type: Acute pain Pain Location: Shoulder Pain Orientation: Left Pain Descriptors / Indicators: Discomfort Pain Onset: With  Activity Pain Intervention(s): Repositioned    Vision Baseline Vision/History: Wears glasses Wears Glasses: Reading only Patient Visual Report: No change from baseline Vision Assessment?: Yes Eye Alignment: Within Functional Limits Ocular Range of Motion: Within Functional Limits Alignment/Gaze Preference: Within Defined Limits(midline orientation most of the time) Tracking/Visual Pursuits: Decreased smoothness of horizontal tracking;Decreased smoothness of vertical tracking Saccades: Within functional limits Convergence: Within functional limits Visual Fields: Left visual field deficit(Needs further testing, difficult to determine with gross testing methods) Perception  Perception: Impaired Inattention/Neglect: (Decreased efficiency attending to the left overall including his left arm and leg) Praxis Praxis: Intact Cognition Overall Cognitive Status: Impaired/Different from baseline Arousal/Alertness: Awake/alert Orientation Level: Oriented X4 Attention: Sustained;Selective Sustained Attention: Impaired Sustained Attention Impairment: Functional basic Selective Attention: Impaired Selective Attention Impairment: Functional basic Memory: Impaired Memory Impairment: Decreased recall of new information Awareness Impairment: Emergent impairment;Anticipatory impairment Problem Solving: Impaired Problem Solving Impairment: Functional basic Reasoning: Impaired Safety/Judgment: Impaired Comments: Pt needs cueing for orientation of clothing and even when cued he is putting it on incorrectly he continues the task without any emergent awareness.   Sensation Sensation Light Touch: Impaired Detail Peripheral sensation comments: Pt able to detect light touch in the LUE accurately in most areas except the lateral aspect of the left hand.  Light Touch Impaired Details: Impaired LUE Hot/Cold: Appears Intact Proprioception: Impaired Detail Stereognosis: Not tested Coordination Gross  Motor Movements are Fluid and Coordinated: No Fine Motor Movements are Fluid and Coordinated: No Coordination and Movement Description: Pt currently Brunnstrum stage III in the left arm and stage II in the hand.  Needs max hand over hand assist to integrate into functional tasks. Noted triceps and biceps   activation as well as some shoulder flexion in synergy pattern.  Motor  Motor Motor: Hemiplegia Motor - Discharge Observations: left hemiparesis with the LUE more impaired currently than the LLE Mobility  Bed Mobility Bed Mobility: Supine to Sit Supine to Sit: Independent Transfers Sit to Stand: Supervision/Verbal cueing Stand to Sit: Supervision/Verbal cueing  Trunk/Postural Assessment  Cervical Assessment Cervical Assessment: Within Functional Limits Thoracic Assessment Thoracic Assessment: Exceptions to WFL(thoracic rounding secondary to sitting posture) Lumbar Assessment Lumbar Assessment: Exceptions to WFL(maintains posterior pelvic tilt in sitting)  Balance Balance Balance Assessed: Yes Static Sitting Balance Static Sitting - Balance Support: No upper extremity supported;Feet supported Static Sitting - Level of Assistance: 7: Independent Dynamic Sitting Balance Dynamic Sitting - Balance Support: During functional activity Dynamic Sitting - Level of Assistance: 6: Modified independent (Device/Increase time) Static Standing Balance Static Standing - Balance Support: During functional activity Static Standing - Level of Assistance: 6: Modified independent (Device/Increase time) Dynamic Standing Balance Dynamic Standing - Balance Support: During functional activity Dynamic Standing - Level of Assistance: 5: Stand by assistance Extremity/Trunk Assessment RUE Assessment RUE Assessment: Within Functional Limits LUE Assessment LUE Assessment: Within Functional Limits General Strength Comments: Pt demonstrates PROM WFLS for all joints with increased tone at the elbow and finger  flexors.   He demonstrates trace digit flexion with increased flexor tone developing.  No digit extension is noted and he needs max assist to integrate into functional tasks as a stabilizer or active assist. LUE Body System: Neuro Brunstrum levels for arm and hand: Arm Brunstrum level for arm: Stage III Synergy is performed voluntarily Brunstrum level for hand: Stage II Synergy is developing   See Function Navigator for Current Functional Status.  MCGUIRE,JAMES OTR/L 08/20/2017, 5:39 PM 

## 2017-08-20 NOTE — Progress Notes (Signed)
Orthopedic Tech Progress Note Patient Details:  [name] [name] 02/03/1966 [id number]  Patient ID: [name] GandyEdward D Chaplin, male   DOB: 12/12/1965, 52 y.o.   MRN: [id number]   Nikki DomCrawford, Olivia Royse 08/20/2017, 9:29 AM Called in [facility] brace order; [facility]

## 2017-08-20 NOTE — Discharge Summary (Signed)
Physician Discharge Summary  Patient ID: [name] [name] MRN: [id number] DOB/AGE: Jul 14, 1965 52 y.o.  Admit date: 08/05/2017 Discharge date: 08/21/2017  Discharge Diagnoses:  Principal Problem:   Acute ischemic right MCA stroke [facility]) Active Problems:   Leukocytosis   Internal carotid artery stenosis, right   Sleep disturbance   Labile blood pressure   Dry eyes   Left hand pain   Discharged Condition: stable   Significant Diagnostic Studies: N/A   Labs:  Basic Metabolic Panel:  BMP Latest Ref Rng & Units 08/19/2017 08/12/2017 08/06/2017  Glucose 65 - 99 mg/dL 846(N) 629(B) 96  BUN 6 - 20 mg/dL 9 14 18   Creatinine 0.61 - 1.24 mg/dL 2.84 1.32 4.40  Sodium 135 - 145 mmol/L 142 143 142  Potassium 3.5 - 5.1 mmol/L 4.0 4.2 3.8  Chloride 101 - 111 mmol/L 108 106 106  CO2 22 - 32 mmol/L 26 30 23   Calcium 8.9 - 10.3 mg/dL 8.9 9.5 9.2    CBC: CBC Latest Ref Rng & Units 08/19/2017 08/12/2017 08/06/2017  WBC 4.0 - 10.5 K/uL 9.3 10.1 10.6(H)  Hemoglobin 13.0 - 17.0 g/dL 10.2 72.5 18.3(H)  Hematocrit 39.0 - 52.0 % 45.9 48.4 52.5(H)  Platelets 150 - 400 K/uL 268 290 254    CBG: No results for input(s): GLUCAP in the last 168 hours.  Brief HPI:   [name] [name] is a 52 year old male with history of HTN, ETOH/tobacco abuse who was admitted on 07/27/17 with left-sided weakness.  UDS was positive for cocaine.  CTA/perfusion head and neck showed large L-MCA infarct with high-grade near occlusive stenosis of right ICA at the bifurcation and occluded right M1 segment with poor collaterals, hyperdense R-MCA sign compatible with thrombus.  He had decline in mental status after admission with increase in mass effect and was treated with hypertonic saline.  Dr. Imogene Burn was consulted for input and recommended waiting for a period time prior to surgical intervention.   He has been treated conservatively and lethargy was resolving.  He continued to be limited by left inattention as well as left-sided weakness  affecting mobility and self-care tasks.  CIR was recommended due to functional deficits.   Hospital Course: [name] [name] was admitted to rehab 08/05/2017 for inpatient therapies to consist of PT, ST and OT at least three hours five days a week. Past admission physiatrist, therapy team and rehab RN have worked together to provide customized collaborative inpatient rehab. Mentation and endurance have improved but he continues to require cues for safety due to right inattention.  No chest pain or SOB reported with increase in activity. Follow up labs showed that leucocytosis has resolved with 5 day course of keflex due to concerns of hidradenitis left axilla. His po intake has improved and renal status is stable. Blood pressures were monitored on bid basis and HCTZ was added to help with better control. He will need further titration of BP medications after discharge.   Sleep disturbance has improved with use of trazodone and low dose melatonin. He reported left hand pain with increase in activity and question due to positioning due to inattention and hand splint was ordered  to help with symptoms. Bowel program was adjusted to help manage constipation. He continues to have poor awareness of deficit sand needs intermittent cues with higher level cognitive tasks. Family education completed regarding need for assistance with medication and financial management as well as 24 hours supervision for safety. He will continue to receive follow up HHPT,  HHOT and HHST by   Rehab course: During patient's stay in rehab weekly team conferences were held to monitor patient's progress, set goals and discuss barriers to discharge. At admission, patient required mod assist with ADL tasks and mobility. He exhibited mild dysarthria with moderate cognitive deficits. He  has had improvement in activity tolerance, balance, postural control as well as ability to compensate for deficits. He has had improvement in functional use LUE   and LLE as well as improvement in awareness. He is able to complete ADL tasks with supervision. He requires min guard assist to ambulate without assistive device. He needs encouragement to use RW and is able  able to ambulate with supervision with AD. He requires min to mod verbal cues for problems solving with basic tasks and mod assist for recall of information.  Family education completed and his sister has hired caregiver during the day.    Discharge disposition: 01-Home or Self Care  Diet: Heart Healthy.   Special Instructions: 1. Needs to continue using walker with ambulation. 2. BP goals 120-160 range due to severe carotid stenosis per Neurology.    Discharge Instructions    Ambulatory referral to Physical Medicine Rehab   Complete by:  As directed    3-4 weeks follow up appointment     Allergies as of 08/21/2017   No Known Allergies     Medication List    STOP taking these medications   benazepril-hydrochlorthiazide 20-25 MG tablet Commonly known as:  LOTENSIN HCT   enoxaparin 40 MG/0.4ML injection Commonly known as:  LOVENOX   senna 8.6 MG Tabs tablet Commonly known as:  SENOKOT     TAKE these medications   acetaminophen 325 MG tablet Commonly known as:  TYLENOL Take 1-2 tablets (325-650 mg total) by mouth every 6 (six) hours as needed for mild pain.   aspirin 325 MG EC tablet Take 1 tablet (325 mg total) by mouth daily.   atorvastatin 40 MG tablet Commonly known as:  LIPITOR Take 1 tablet (40 mg total) by mouth daily at 6 PM.   FLUoxetine 10 MG capsule Commonly known as:  PROZAC Take 1 capsule (10 mg total) by mouth daily at 8 pm.   folic acid 1 MG tablet Commonly known as:  FOLVITE Take 1 tablet (1 mg total) by mouth daily.   hydrochlorothiazide 12.5 MG capsule Commonly known as:  MICROZIDE Take 1 capsule (12.5 mg total) by mouth daily. Start taking on:  08/22/2017   Melatonin 3 MG Tabs Take 1 tablet (3 mg total) by mouth daily at 8 pm.    multivitamin with minerals Tabs tablet Take 1 tablet by mouth daily.   nicotine 14 mg/24hr patch Commonly known as:  NICODERM CQ - dosed in mg/24 hours Place 1 patch (14 mg total) onto the skin daily after supper.   pantoprazole 40 MG tablet Commonly known as:  PROTONIX Take 1 tablet (40 mg total) by mouth daily. Start taking on:  08/22/2017   polyvinyl alcohol 1.4 % ophthalmic solution Commonly known as:  LIQUIFILM TEARS Place 1 drop into both eyes as needed for dry eyes.   senna-docusate 8.6-50 MG tablet Commonly known as:  Senokot-S Take 2 tablets by mouth daily at 8 pm. What changed:    how much to take  when to take this  reasons to take this   traZODone 50 MG tablet Commonly known as:  DESYREL Take 0.5-1 tablets (25-50 mg total) by mouth daily at 8 pm.  Follow-up Information    Marcello Fennel, MD Follow up.   Specialty:  Physical Medicine and Rehabilitation Why:  Office will call you for follow up appointment Contact information: [address] [place] [address] [phone]        Tressie Stalker, MD. Call.   Specialty:  Neurosurgery Why:  as needed Contact information: [address] [place] [address] [phone]        Alysia Penna, MD Follow up.   Specialty:  Internal Medicine Why:  Office to contact you regarding follow up appointment Contact information: [address] [place] [address] [phone]        Fransisco Hertz, MD. Call on 10/02/2017.   Specialties:  Vascular Surgery, Cardiology Why:  Be ther 10:15 am for 10:30 appointment for follow up on carotid stenosis/input on surgery Contact information: [address] [place] [address] [phone]        [facility]. Call in 1 day(s).   Why:  for follow up appointment in 2-3 weeks Contact information: [address]     [address] [place] [address] [phone]           Signed: Jacquelynn Cree 08/21/2017, 7:02 PM

## 2017-08-20 NOTE — Progress Notes (Signed)
Social Work  Discharge Note  The overall goal for the admission was met for:   Discharge location: [place]   Length of Stay: Yes-16 DAYS  Discharge activity level: Yes-SUPERVISION LEVEL  Home/community participation: Yes  Services provided included: MD, RD, PT, OT, SLP, RN, CM, TR, Pharmacy, Neuropsych and SW  Financial Services: Private Insurance: [employer]  Follow-up services arranged: Home Health: [facility]-PT,OT,SP and Patient/Family has no preference for HH/DME agencies  Comments (or additional information):SISTER HAS SOMEONE TO STAY WITH HIM DURING THE DAY AND HAS SOME HELP AT NIGHT. AWARE RECOMMENDATION IS 24 HR SUPERVISION DUE TO SAFETY ISSUES AND IMPULSIVE. HIGH RISK TO FALL AT HOME. INSURANCE DENIED NH COVERAGE DOESN'T MEET CRITERIA. PROVIDED FAMILY EDUCATION TO SISTER DAY OF DC  Patient/Family verbalized understanding of follow-up arrangements: Yes  Individual responsible for coordination of the follow-up plan: TERESA-SISTER  Confirmed correct DME delivered: Elease Hashimoto 08/20/2017    Elease Hashimoto

## 2017-08-20 NOTE — Progress Notes (Signed)
Speech Language Pathology Daily Session Note  Patient Details  Name: [name] [name] MRN: [id number] Date of Birth: 03/26/1965  Today's Date: 08/20/2017 SLP Individual Time: 0930-1000 SLP Individual Time Calculation (min): 30 min  Short Term Goals: Week 2: SLP Short Term Goal 1 (Week 2): Pt will use slow rate, overarticulation, and increased vocal intensity to achieve intelligibility at the conversational level in a noisy, distracting environment.  SLP Short Term Goal 2 (Week 2): Pt will recall daily information with min cues for use of memory compensatory strategies.  SLP Short Term Goal 3 (Week 2): Pt will complete semi-complex tasks with min assist verbal cues for functional problem solving  SLP Short Term Goal 4 (Week 2): Pt will locate items to the left of midline during functional tasks for >75% accuracy with supervision verbal cues.  SLP Short Term Goal 5 (Week 2): Pt will selectively attend to task in a moderately distracting environment for 30 minutes with min verbal cues for redirection.   Skilled Therapeutic Interventions: Skilled treatment session focused on cognitive goals. SLP facilitated session by providing Mod A verbal cues for recall of procedures to a previously learned task. However, throughout task, patient only required supervision verbal cues for delayed recall after an ~15 min delay. Patient independently requested to use the bathroom and required Min-Mod A verbal cues for problem solving during transfer. Patient was able to successfully void. Patient left upright in bed with all needs within reach. Continue with current plan of care.          Function:   Cognition Comprehension Comprehension assist level: Understands basic 75 - 89% of the time/ requires cueing 10 - 24% of the time  Expression   Expression assist level: Expresses basic 90% of the time/requires cueing < 10% of the time.  Social Interaction Social Interaction assist level: Interacts appropriately 75 -  89% of the time - Needs redirection for appropriate language or to initiate interaction.  Problem Solving Problem solving assist level: Solves basic 50 - 74% of the time/requires cueing 25 - 49% of the time  Memory Memory assist level: Recognizes or recalls 75 - 89% of the time/requires cueing 10 - 24% of the time    Pain No/Denies Pain   Therapy/Group: Individual Therapy  [name] 08/20/2017, 2:28 PM

## 2017-08-20 NOTE — Progress Notes (Signed)
Occupational Therapy Session Note  Patient Details  Name: [name] [name] MRN: [id number] Date of Birth: 10/21/1965  Today's Date: 08/20/2017 OT Individual Time: [id number] OT Individual Time Calculation (min): 45 min    Short Term Goals: Week 2:  OT Short Term Goal 1 (Week 2): Pt will complete UB dressing with supervision and min instructional cueing for donning pullover shirt.  OT Short Term Goal 2 (Week 2): Pt will complete LB bathing with supervision sit to stand.  OT Short Term Goal 3 (Week 2): Pt will complete walk-in shower transfers with min guard assist using the RW to the tub bench. OT Short Term Goal 4 (Week 2): Pt will complete toilet transfers with min guard assist using the RW to the elevated toilet. OT Short Term Goal 5 (Week 2): Pt will use the LUE as an gross assist for holding items to be opened with no more than mod instructional cueing and mod assist.   Skilled Therapeutic Interventions/Progress Updates:    Pt completed supine to sit EOB with supervision and then worked on donning shoes.  He needed mod instructional cueing to fasten shoe buttons correctly but could complete them with supervision.  He then worked on functional mobility with use of the RW and hand splint on the left side with min guard assist to the ADL apartment.  Occasional toe drag on the left foot once on the carpeted area of the apartment.  Mod demonstrational cueing for placement and removal of the left hand on the hand splint of the walker.  Practiced toilet and walk-in shower transfers based on pt's setup at home.  He was able to get up and down from a handicapped height toilet with supervision and feel he will be able to do the same from a lower toilet as he has at home, without use of DME over it.  He was then able to complete simulated walk-in shower transfer with supervision using the RW.  Educated pt on backing into the shower while holding the walker and then turning around to the seat once in it.  Also  educated on stepping over holding onto the wall with the RUE if walker was not used.  Feel he does need a shower seat at discharge for safety and per SW family is checking on this.  Had pt ambulate half way back to his room with the RW and supervision.  After half way he became fatigued and requested to sit down.  He used the wheelchair and rolled himself back to the room where he transferred to the bed with supervision to conclude session.  Resting hand splint placed on the left hand with hand also supported on the pillow.  Call button in reach and bed alarm in place.    Therapy Documentation Precautions:  Precautions Precautions: Fall Precaution Comments: L inattention, L hemiparesis Restrictions Weight Bearing Restrictions: No  Pain: Pain Assessment Pain Scale: Faces Faces Pain Scale: Hurts a little bit Pain Type: Acute pain Pain Location: Shoulder Pain Orientation: Left Pain Descriptors / Indicators: Discomfort Pain Onset: With Activity Pain Intervention(s): Repositioned ADL: See Function Navigator for Current Functional Status.   Therapy/Group: Individual Therapy  Zerek Litsey OTR/L 08/20/2017, 4:22 PM

## 2017-08-20 NOTE — Progress Notes (Addendum)
Social Work Patient ID: [name] GandyEdward D Munns, male   DOB: 11/22/1965, 52 y.o.   MRN: [id number]  Spoke with Teresa-sister to inform [facility] can not take pt and if there were others they would like me to check into. She feels pt wants to go home and they have someone to be with during the day and she can stay at night. She wants to take him home and see how he does, especially since he is looking forward to going home tomorrow. So plan now is home tomorrow. Sister has been here and aware of the care he requires. Has all equipment from other family members. See sister in am. Therapy team wanted sister to come in for therapies tomorrow she can be here at 9:00 am. Team aware of this. Spoke with Monia Pouchetna who reports he does not meet criteria for NH and would need to come up with another plan if this is decided upon.

## 2017-08-20 NOTE — Plan of Care (Signed)
  Problem: Consults Goal: RH STROKE PATIENT EDUCATION Description See Patient Education module for education specifics  Outcome: Progressing   Problem: RH BOWEL ELIMINATION Goal: RH STG MANAGE BOWEL WITH ASSISTANCE Description STG Manage Bowel with  Mod I Assistance.  Outcome: Progressing Flowsheets (Taken 08/20/2017 1321) STG: Pt will manage bowels with assistance: 6-Modified independent Goal: RH STG MANAGE BOWEL W/MEDICATION W/ASSISTANCE Description STG Manage Bowel with Medication with mod I  Assistance.  Outcome: Progressing Flowsheets (Taken 08/20/2017 1321) STG: Pt will manage bowels with medication with assistance: 6-Modified independent   Problem: RH SAFETY Goal: RH STG ADHERE TO SAFETY PRECAUTIONS W/ASSISTANCE/DEVICE Description STG Adhere to Safety Precautions With cues/supervision Assistance/Device.  Outcome: Progressing Flowsheets (Taken 08/20/2017 1321) STG:Pt will adhere to safety precautions with assistance/device: 6-Modified independent   Problem: RH KNOWLEDGE DEFICIT Goal: RH STG INCREASE KNOWLEDGE OF HYPERTENSION Description Pt will be able to explain medication regimen and diet restrictions to manage hypertension and secondary stroke prevention with cues/reminders/resources  Outcome: Progressing   Problem: RH Vision Goal: RH LTG Vision (Specify) Outcome: Progressing

## 2017-08-20 NOTE — Progress Notes (Signed)
[facility] PHYSICAL MEDICINE & REHABILITATION     PROGRESS NOTE  Subjective/Complaints:   Appreciate SW note.Left wrist pain this am, "slept on it wrong".  Shoulder ok, Slept on Right side  ROS: denies CP, SOB, nausea, vomiting, diarrhea.  Objective: Vital Signs: Blood pressure (!) 142/94, pulse 70, temperature 98 F (36.7 C), temperature source Oral, resp. rate 16, height 5\' 5"  (1.651 m), weight 72.2 kg (159 lb 2.8 oz), SpO2 96 %. No results found. Recent Labs    08/19/17 0607  WBC 9.3  HGB 15.7  HCT 45.9  PLT 268   Recent Labs    08/19/17 0607  NA 142  K 4.0  CL 108  GLUCOSE 101*  BUN 9  CREATININE 0.96  CALCIUM 8.9   CBG (last 3)  No results for input(s): GLUCAP in the last 72 hours.  Wt Readings from Last 3 Encounters:  08/07/17 72.2 kg (159 lb 2.8 oz)  07/27/17 76.3 kg (168 lb 3.2 oz)  09/07/13 72.2 kg (159 lb 2 oz)    Physical Exam:  BP (!) 142/94 (BP Location: Left Arm)   Pulse 70   Temp 98 F (36.7 C) (Oral)   Resp 16   Ht 5\' 5"  (1.651 m)   Wt 72.2 kg (159 lb 2.8 oz)   SpO2 96%   BMI 26.49 kg/m  Constitutional: No distress . Vital signs reviewed. HENT: Normocephalic.  Atraumatic. Eyes: EOMI. No discharge. Cardiovascular: RRR. No JVD. Respiratory: CTA Bilaterally. Normal effort. GI: BS +. Non-distended. Musc: No edema or tenderness in extremities.No wrist pain with ROM, no effusion or erythema, no TTP Neurological: He is alert and oriented..  Follows commands Left facial weakness with mild dysarthria persists.  Left upper extremity: Shoulder abduction 1+/5 (some pain inhibition), elbow flexion/extension 3+-4-/5, wrist extension, hand grip 0/5 (stable) Left lower extremity: 4/5 proximal to distal Emerging tone LUE/LLE Skin: Skin is warm and dry.  Psychiatric: His affect is blunt.   Assessment/Plan: 1. Functional deficits secondary to right MCA infarct which require 3+ hours per day of interdisciplinary therapy in a comprehensive inpatient  rehab setting. Physiatrist is providing close team supervision and 24 hour management of active medical problems listed below. Physiatrist and rehab team continue to assess barriers to discharge/monitor patient progress toward functional and medical goals.  Function:  Bathing Bathing position   Position: Shower  Bathing parts Body parts bathed by patient: Chest, Abdomen, Left arm, Front perineal area, Buttocks, Right upper leg, Left upper leg, Right lower leg, Left lower leg Body parts bathed by helper: Back, Right arm  Bathing assist Assist Level: Supervision or verbal cues      Upper Body Dressing/Undressing Upper body dressing   What is the patient wearing?: Pull over shirt/dress     Pull over shirt/dress - Perfomed by patient: Put head through opening, Thread/unthread right sleeve, Pull shirt over trunk Pull over shirt/dress - Perfomed by helper: Thread/unthread left sleeve        Upper body assist Assist Level: (Mod A)      Lower Body Dressing/Undressing Lower body dressing   What is the patient wearing?: Pants, Underwear, Socks, Non-skid slipper socks Underwear - Performed by patient: Thread/unthread right underwear leg, Thread/unthread left underwear leg, Pull underwear up/down Underwear - Performed by helper: Pull underwear up/down Pants- Performed by patient: Thread/unthread right pants leg, Thread/unthread left pants leg, Pull pants up/down Pants- Performed by helper: Pull pants up/down Non-skid slipper socks- Performed by patient: Don/doff right sock Non-skid slipper socks- Performed by  helper: Don/doff left sock Socks - Performed by patient: Don/doff right sock Socks - Performed by helper: Don/doff left sock Shoes - Performed by patient: Don/doff right shoe, Don/doff left shoe, Fasten right, Fasten left Shoes - Performed by helper: Fasten right, Fasten left          Lower body assist Assist for lower body dressing: Touching or steadying assistance (Pt > 75%)       Toileting Toileting   Toileting steps completed by patient: Adjust clothing prior to toileting, Performs perineal hygiene, Adjust clothing after toileting Toileting steps completed by helper: Adjust clothing after toileting Toileting Assistive Devices: Grab bar or rail  Toileting assist Assist level: Touching or steadying assistance (Pt.75%)   Transfers Chair/bed transfer   Chair/bed transfer method: Ambulatory Chair/bed transfer assist level: Touching or steadying assistance (Pt > 75%) Chair/bed transfer assistive device: Armrests     Locomotion Ambulation     Max distance: 15 Assist level: Touching or steadying assistance (Pt > 75%)   Wheelchair   Type: Manual Max wheelchair distance: 150 Assist Level: Touching or steadying assistance (Pt > 75%)  Cognition Comprehension Comprehension assist level: Follows basic conversation/direction with no assist  Expression Expression assist level: Expresses complex 90% of the time/cues < 10% of the time  Social Interaction Social Interaction assist level: Interacts appropriately 90% of the time - Needs monitoring or encouragement for participation or interaction.  Problem Solving Problem solving assist level: Solves complex 90% of the time/cues < 10% of the time  Memory Memory assist level: Recognizes or recalls 90% of the time/requires cueing < 10% of the time    Medical Problem List and Plan: 1.  Left inattention and left sided weakness affecting mobility as well as ability to carry out ADL tasks secondary to right MCA infarct.  Cont CIR PT, OT, SLP, plan for SNF, team conf in am  WHO/PRAFO   Fluoxetine ordered on 6/4 2.  DVT Prophylaxis/Anticoagulation: Pharmaceutical: Lovenox PLT nl 6/17 3. Pain Management: tylenol prn.  4. Mood: LCSW to follow for evaluation and support.  5. Neuropsych: This patient is capable of making decisions on his own behalf. 6. Skin/Wound Care: Routine pressure relief measures.   Keflex completed on  6/12 7. Fluids/Electrolytes/Nutrition: Monitor I/O.   BMP within normal range on 6/10 8. HTN: Monitor BP bid. Was on Lisinopril/HCTZ 20-25 daily at home   Vitals:   08/19/17 2017 08/20/17 0531  BP: 132/86 (!) 142/94  Pulse: 83 70  Resp: 16 16  Temp: 98.5 F (36.9 C) 98 F (36.7 C)  SpO2: 95% 96%  elevated diastolic start HCTZ 12.5 on 6/17, monitor 9. Constipation: Cont bowel meds 10 GERD: Continue Protonix.   11. Tobacco/polysubstance  abuse: Encourage cessation. IS while awake to help with cough.  12. Right ICA stenosis: CEA in ~3weeks  14. Sleep disturbance  Melatonin started on 6/5 increased on 6/7  Trazodone/melatonin timing adjusted to-8 PM per patient request   Improving 15. Dry eyes  Artificial tears started on 6/11  Improving 16.  Left wrist pain likely positional, will order resting hand splint LOS (Days) 15 A FACE TO FACE EVALUATION WAS PERFORMED  Erick Colacendrew E Jolan Mealor 08/20/2017 6:45 AM

## 2017-08-20 NOTE — Progress Notes (Signed)
Occupational Therapy Session Note  Patient Details  Name: [name] [name] MRN: [id number] Date of Birth: 07/05/1965  Today's Date: 08/20/2017 OT Individual Time: 1004-1100 OT Individual Time Calculation (min): 56 min    Short Term Goals: Week 2:  OT Short Term Goal 1 (Week 2): Pt will complete UB dressing with supervision and min instructional cueing for donning pullover shirt.  OT Short Term Goal 2 (Week 2): Pt will complete LB bathing with supervision sit to stand.  OT Short Term Goal 3 (Week 2): Pt will complete walk-in shower transfers with min guard assist using the RW to the tub bench. OT Short Term Goal 4 (Week 2): Pt will complete toilet transfers with min guard assist using the RW to the elevated toilet. OT Short Term Goal 5 (Week 2): Pt will use the LUE as an gross assist for holding items to be opened with no more than mod instructional cueing and mod assist.   Skilled Therapeutic Interventions/Progress Updates:    Pt ambulated to the shower with close supervision and completed bathing sit to stand with supervision and mod instructional cueing for thoroughness and for hemi one handed technique.  Pt with decreased initiation to bathing and dressing tasks, requiring mod instructional cueing to initiation and for following direction.  When pt is donning something incorrectly he continues to work on it without any awareness that it is incorrect.  Max demonstrational cueing for donning pullover shirt following hemi technique as pt cannot orient and sequence this.  Finished session with pt in the bed and call button and phone in reach.  Bed alarm in place.    Therapy Documentation Precautions:  Precautions Precautions: Fall Precaution Comments: L inattention, L hemiparesis Restrictions Weight Bearing Restrictions: No  Pain: Pain Assessment Pain Score: 0-No pain ADL: See Function Navigator for Current Functional Status.   Therapy/Group: Individual Therapy  Jakyren Fluegge  OTR/L 08/20/2017, 12:55 PM

## 2017-08-20 NOTE — Progress Notes (Signed)
Physical Therapy Session Note  Patient Details  Name: [name] [name] MRN: [id number] Date of Birth: 02/28/1966  Today's Date: 08/20/2017 PT Individual Time: 0800-0900 PT Individual Time Calculation (min): 60 min   Short Term Goals: Week 2:  PT Short Term Goal 1 (Week 2): Pt will perform bed mobility modI from flat bed PT Short Term Goal 2 (Week 2): Pt will perform stand pivot transfer consistent S PT Short Term Goal 3 (Week 2): Pt will ambulate x150' with consistent min guard PT Short Term Goal 4 (Week 2): Pt will ascend/descent 8 steps min guard  Skilled Therapeutic Interventions/Progress Updates: Pt received supine in bed, c/o L wrist pain 4/10 and agreeable to treatment. Supine>sit modI. Pt ambulates to gym with no AD and min guard; one mild LOB when pt distracted by surroundings. Lateral steps up at 6" steps 2x15 BLE with S for focus on hip abduction/extensor strengthening. Standing heel raises at 6" step with RUE support 2x15 reps. Supine crunches 2x15. L propped on elbow sidelying while performing RUE reaching activity setting animal figurines upright then back in the container for LUE weight bearing. Prone trunk extension 3x5 reps with evidence of L scapular retraction AROM. Pt reports LEs too fatigued to walk to day room; performed BLE w/c propulsion instead x150' with cues for technique to facilitate increased use of LLE. Nustep BUE/BLE with L hand splint on level 4 x8 min for focus on aerobic endurance, L NMR. Returned to room totalA for energy conservation. Stand pivot with S to return to bed. Remained in bed, alarm intact, all needs in reach.      Therapy Documentation Precautions:  Precautions Precautions: Fall Precaution Comments: L inattention, L hemiparesis Restrictions Weight Bearing Restrictions: No   See Function Navigator for Current Functional Status.   Therapy/Group: Individual Therapy  Vista Lawmanlizabeth J Tygielski 08/20/2017, 9:01 AM

## 2017-08-21 ENCOUNTER — Encounter (HOSPITAL_COMMUNITY): Payer: Managed Care, Other (non HMO) | Admitting: Occupational Therapy

## 2017-08-21 ENCOUNTER — Ambulatory Visit (HOSPITAL_COMMUNITY): Payer: Managed Care, Other (non HMO) | Admitting: Physical Therapy

## 2017-08-21 ENCOUNTER — Encounter (HOSPITAL_COMMUNITY): Payer: Managed Care, Other (non HMO) | Admitting: Speech Pathology

## 2017-08-21 DIAGNOSIS — M79642 Pain in left hand: Secondary | ICD-10-CM

## 2017-08-21 MED ORDER — POLYVINYL ALCOHOL 1.4 % OP SOLN: 1.0000 [drp] | OPHTHALMIC | 0 refills | Status: DC | PRN

## 2017-08-21 MED ORDER — ACETAMINOPHEN 325 MG PO TABS: 325.0000 mg | ORAL_TABLET | Freq: Four times a day (QID) | ORAL | Status: DC | PRN

## 2017-08-21 MED ORDER — FLUOXETINE HCL 10 MG PO CAPS: 10.0000 mg | ORAL_CAPSULE | Freq: Every day | ORAL | 0 refills | Status: AC

## 2017-08-21 MED ORDER — SENNOSIDES-DOCUSATE SODIUM 8.6-50 MG PO TABS: 2.0000 | ORAL_TABLET | Freq: Every day | ORAL | 0 refills | Status: AC

## 2017-08-21 MED ORDER — FOLIC ACID 1 MG PO TABS: 1.0000 mg | ORAL_TABLET | Freq: Every day | ORAL | 0 refills | Status: AC

## 2017-08-21 MED ORDER — TRAZODONE HCL 50 MG PO TABS: 25.0000 mg | ORAL_TABLET | Freq: Every day | ORAL | 0 refills | Status: AC

## 2017-08-21 MED ORDER — PANTOPRAZOLE SODIUM 40 MG PO TBEC: 40.0000 mg | DELAYED_RELEASE_TABLET | Freq: Every day | ORAL | 0 refills | Status: DC

## 2017-08-21 MED ORDER — ASPIRIN 325 MG PO TBEC: 325.0000 mg | DELAYED_RELEASE_TABLET | Freq: Every day | ORAL | 0 refills | Status: AC

## 2017-08-21 MED ORDER — MELATONIN 3 MG PO TABS: 3.0000 mg | ORAL_TABLET | Freq: Every day | ORAL | 0 refills | Status: AC

## 2017-08-21 MED ORDER — HYDROCHLOROTHIAZIDE 12.5 MG PO CAPS: 12.5000 mg | ORAL_CAPSULE | Freq: Every day | ORAL | 0 refills | Status: AC

## 2017-08-21 MED ORDER — ATORVASTATIN CALCIUM 40 MG PO TABS: 40.0000 mg | ORAL_TABLET | Freq: Every day | ORAL | 0 refills | Status: AC

## 2017-08-21 MED ORDER — NICOTINE 14 MG/24HR TD PT24: 14.0000 mg | MEDICATED_PATCH | Freq: Every day | TRANSDERMAL | 0 refills | Status: DC

## 2017-08-21 MED ORDER — ASPIRIN EC 325 MG PO TBEC
325.0000 mg | DELAYED_RELEASE_TABLET | Freq: Every day | ORAL | Status: DC
  Administered 2017-08-21: 325 mg via ORAL
  Filled 2017-08-21: qty 1

## 2017-08-21 NOTE — Plan of Care (Signed)
  Problem: RH SAFETY Goal: RH STG ADHERE TO SAFETY PRECAUTIONS W/ASSISTANCE/DEVICE Description STG Adhere to Safety Precautions With cues/supervision Assistance/Device.  Outcome: Progressing Proper footwear, call light at hand

## 2017-08-21 NOTE — Plan of Care (Signed)
  Problem: Consults Goal: RH STROKE PATIENT EDUCATION Description See Patient Education module for education specifics  Outcome: Adequate for Discharge   

## 2017-08-21 NOTE — Discharge Instructions (Signed)
Inpatient Rehab Discharge Instructions  [name] [name] Discharge date and time: 08/21/17   Activities/Precautions/ Functional Status: Activity: no lifting, driving, or strenuous exercise till cleared by MD Diet: cardiac diet Wound Care: none needed    Functional status:  ___ No restrictions     ___ Walk up steps independently _X__ 24/7 supervision/assistance   ___ Walk up steps with assistance ___ Intermittent supervision/assistance  ___ Bathe/dress independently ___ Walk with walker     _X__ Bathe/dress with supervision  ___ Walk Independently    ___ Shower independently ___ Walk with assistance    ___ Shower with assistance _X__ No alcohol     ___ Return to work/school ________   Special Instructions: 1. He needs to use walker whenever up and walking (or hands on assist if not using walker) 2. Family needs to assist with medication management and finances.  3. Blood pressure goals 120- 160 to allow for perfusion due to severe carotid stenosis.    COMMUNITY REFERRALS UPON DISCHARGE:    Home Health:   PT, OT, SP     Agency: [facility]   Phone: [phone]  Date of last service:08/21/2017  Medical Equipment/Items Ordered:HAS ALL EQUIPMENT FROM OTHER FAMILY MEMBERS  Agency/Supplier:   GENERAL COMMUNITY RESOURCES FOR PATIENT/FAMILY: Support Groups:[facility] SUPPORT GROUP EVERY SECOND Thursday (SEPT-MAY) ON THE REHAB UNIT @ 3:00-4:00 PM QUESTIONS CONTACT [place] [phone]  STROKE/TIA DISCHARGE INSTRUCTIONS SMOKING Cigarette smoking nearly doubles your risk of having a stroke & is the single most alterable risk factor  If you smoke or have smoked in the last 12 months, you are advised to quit smoking for your health.  Most of the excess cardiovascular risk related to smoking disappears within a year of stopping.  Ask you doctor about anti-smoking medications  [place] Quit Line: 1-800-QUIT NOW  Free Smoking Cessation Classes [phone]  CHOLESTEROL Know your levels; limit  fat & cholesterol in your diet  Lipid Panel     Component Value Date/Time   CHOL 172 07/28/2017 0848   TRIG 99 07/28/2017 0848   HDL 46 07/28/2017 0848   CHOLHDL 3.7 07/28/2017 0848   VLDL 20 07/28/2017 0848   LDLCALC 106 (H) 07/28/2017 0848      Many patients benefit from treatment even if their cholesterol is at goal.  Goal: Total Cholesterol (CHOL) less than 160  Goal:  Triglycerides (TRIG) less than 150  Goal:  HDL greater than 40  Goal:  LDL (LDLCALC) less than 100   BLOOD PRESSURE American Stroke Association blood pressure target is less that 120/80 mm/Hg  Your discharge blood pressure is:  BP: (!) 150/84  Monitor your blood pressure  Limit your salt and alcohol intake  Many individuals will require more than one medication for high blood pressure  DIABETES (A1c is a blood sugar average for last 3 months) Goal HGBA1c is under 7% (HBGA1c is blood sugar average for last 3 months)  Diabetes: No known diagnosis of diabetes    Lab Results  Component Value Date   HGBA1C 5.0 07/28/2017     Your HGBA1c can be lowered with medications, healthy diet, and exercise.  Check your blood sugar as directed by your physician  Call your physician if you experience unexplained or low blood sugars.  PHYSICAL ACTIVITY/REHABILITATION Goal is 30 minutes at least 4 days per week  Activity: No driving, Therapies: see above Return to work: N/A  Activity decreases your risk of heart attack and stroke and makes your heart stronger.  It  helps control your weight and blood pressure; helps you relax and can improve your mood.  Participate in a regular exercise program.  Talk with your doctor about the best form of exercise for you (dancing, walking, swimming, cycling).  DIET/WEIGHT Goal is to maintain a healthy weight  Your discharge diet is:  Diet Order           Diet Heart Room service appropriate? Yes; Fluid consistency: Thin  Diet effective now         liquids Your height is:   Height: 5\' 5"  (165.1 cm) Your current weight is: Weight: 72.2 kg (159 lb 2.8 oz) Your Body Mass Index (BMI) is:  BMI (Calculated): 26.49  Following the type of diet specifically designed for you will help prevent another stroke.  Your goal weight  Is: 150 lbs  Your goal Body Mass Index (BMI) is 19-24.  Healthy food habits can help reduce 3 risk factors for stroke:  High cholesterol, hypertension, and excess weight.  RESOURCES Stroke/Support Group:  Call [phone]   STROKE EDUCATION PROVIDED/REVIEWED AND GIVEN TO PATIENT Stroke warning signs and symptoms How to activate emergency medical system (call 911). Medications prescribed at discharge. Need for follow-up after discharge. Personal risk factors for stroke. Pneumonia vaccine given:  Flu vaccine given:  My questions have been answered, the writing is legible, and I understand these instructions.  I will adhere to these goals & educational materials that have been provided to me after my discharge from the hospital.     My questions have been answered and I understand these instructions. I will adhere to these goals and the provided educational materials after my discharge from the hospital.  Patient/Caregiver Signature _______________________________ Date __________  Clinician Signature _______________________________________ Date __________  Please bring this form and your medication list with you to all your follow-up doctor's appointments.

## 2017-08-21 NOTE — Progress Notes (Signed)
Physical Therapy Discharge Summary  Patient Details  Name: [name] [name] MRN: [id number] Date of Birth: 01-Feb-1966  Today's Date: 08/21/2017 PT Individual Time: 3875-6433 PT Individual Time Calculation (min): 55 min    Patient has met 12 of 12 long term goals due to improved activity tolerance, improved balance, improved postural control, increased strength, decreased pain, ability to compensate for deficits, functional use of  left lower extremity, improved attention, improved awareness and improved coordination.  Patient to discharge at an ambulatory level Supervision.   Patient's care partner is independent to provide the necessary physical and cognitive assistance at discharge.  Reasons goals not met: All goals met  Recommendation:  Patient will benefit from ongoing skilled PT services in home health setting to continue to advance safe functional mobility, address ongoing impairments in balance, coordination, activity tolerance, safety awareness, and minimize fall risk.  Equipment: No equipment provided  Reasons for discharge: treatment goals met and discharge from hospital  Patient/family agrees with progress made and goals achieved: Yes  PT Discharge Precautions/Restrictions Precautions Precautions: Fall Precaution Comments: improving L inattention, L hemiparesis UE > LE, decreased awareness Restrictions Weight Bearing Restrictions: No Vital Signs Therapy Vitals Temp: 97.8 F (36.6 C) Temp Source: Oral Pulse Rate: 83 Resp: 19 BP: (!) 144/89 Patient Position (if appropriate): Lying Oxygen Therapy SpO2: 94 % O2 Device: Room Air Pain Pain Assessment Pain Scale: 0-10 Pain Score: 0-No pain Vision/Perception  Perception Perception: Impaired Inattention/Neglect: (improving L inattention) Praxis Praxis: Intact  Cognition Overall Cognitive Status: Impaired/Different from baseline Arousal/Alertness: Awake/alert Orientation Level: Oriented X4 Attention:  Sustained Sustained Attention: Impaired Sustained Attention Impairment: Functional basic;Verbal basic Memory: Impaired Memory Impairment: Decreased recall of new information Awareness: Impaired Awareness Impairment: Intellectual impairment;Emergent impairment Problem Solving: Impaired Problem Solving Impairment: Functional basic Executive Function: Reasoning;Decision Making;Self Monitoring;Self Correcting;Organizing Reasoning: Impaired Reasoning Impairment: Functional basic;Verbal basic Organizing: Impaired Organizing Impairment: Functional basic Decision Making: Impaired Decision Making Impairment: Verbal basic;Functional basic Self Monitoring: Impaired Self Monitoring Impairment: Functional basic Self Correcting: Impaired Self Correcting Impairment: Functional basic Behaviors: Impulsive;Poor frustration tolerance Safety/Judgment: Impaired Comments: left inattention  Sensation Sensation Light Touch: Impaired Detail Peripheral sensation comments: Pt able to detect light touch in the LUE accurately in most areas except the lateral aspect of the left hand.  Light Touch Impaired Details: Impaired LUE Proprioception: Impaired Detail Proprioception Impaired Details: Impaired LUE Coordination Gross Motor Movements are Fluid and Coordinated: No Fine Motor Movements are Fluid and Coordinated: No Coordination and Movement Description: Pt currently Brunnstrum stage III in the left arm and stage II in the hand.  Needs max hand over hand assist to integrate into functional tasks. Noted triceps and biceps activation as well as some shoulder flexion in synergy pattern.  Heel Shin Test: mild impairment LLE, decreased excursion Motor  Motor Motor: Hemiplegia Motor - Discharge Observations: left hemiparesis with the LUE more impaired than the LLE  Mobility Bed Mobility Bed Mobility: Supine to Sit;Sit to Supine Supine to Sit: Independent Sit to Supine: Independent Transfers Transfers: Sit  to [employer] Transfers Sit to Stand: Independent Stand to Sit: Independent Stand Pivot Transfers: Independent Locomotion  Gait Ambulation: Yes Gait Assistance: Supervision/Verbal cueing Gait Distance (Feet): 150 Feet Assistive device: None Gait Assistance Details: Verbal cues for technique;Verbal cues for precautions/safety Gait Assistance Details: cues to decrease cognitive dual task while ambulating Gait Gait: Yes Gait Pattern: Impaired Gait Pattern: Step-through pattern;Decreased stance time - left;Decreased stride length;Lateral hip instability;Poor foot clearance - left Gait velocity: 2.85 ft/sec; indicative of increased risk  for falls High Level Ambulation High Level Ambulation: Backwards walking;Side stepping Side Stepping: S with RUE support Backwards Walking: S with RUE support Stairs / Additional Locomotion Stairs: Yes Stairs Assistance: Supervision/Verbal cueing Stair Management Technique: One rail Right;Alternating pattern;Step to pattern;Forwards(alternating ascent, step-to descent) Number of Stairs: 12 Height of Stairs: 6 Ramp: Supervision/Verbal cueing Curb: Supervision/Verbal cueing Wheelchair Mobility Wheelchair Mobility: No  Trunk/Postural Assessment  Cervical Assessment Cervical Assessment: Within Functional Limits Thoracic Assessment Thoracic Assessment: Exceptions to WFL(increased thoracic kyphosis, flexible) Lumbar Assessment Lumbar Assessment: Exceptions to WFL(posterior pelvic tilt, reduced lumbar lordosis) Postural Control Postural Control: Deficits on evaluation(delayed stepping/righting reactions standing)  Balance Balance Balance Assessed: Yes Standardized Balance Assessment Standardized Balance Assessment: Timed Up and Go Test;Berg Balance Test Berg Balance Test Sit to Stand: Able to stand without using hands and stabilize independently Standing Unsupported: Able to stand safely 2 minutes Sitting with Back Unsupported but Feet  Supported on Floor or Stool: Able to sit safely and securely 2 minutes Stand to Sit: Sits safely with minimal use of hands Transfers: Able to transfer safely, minor use of hands Standing Unsupported with Eyes Closed: Able to stand 10 seconds with supervision Standing Ubsupported with Feet Together: Able to place feet together independently and stand for 1 minute with supervision From Standing, Reach Forward with Outstretched Arm: Can reach forward >12 cm safely (5") From Standing Position, Pick up Object from Floor: Able to pick up shoe, needs supervision From Standing Position, Turn to Look Behind Over each Shoulder: Looks behind from both sides and weight shifts well Turn 360 Degrees: Able to turn 360 degrees safely but slowly Standing Unsupported, Alternately Place Feet on Step/Stool: Able to complete >2 steps/needs minimal assist Standing Unsupported, One Foot in Front: Able to plae foot ahead of the other independently and hold 30 seconds Standing on One Leg: Able to lift leg independently and hold 5-10 seconds Total Score: 45 Timed Up and Go Test TUG: Normal TUG Normal TUG (seconds): 18 Static Sitting Balance Static Sitting - Balance Support: No upper extremity supported;Feet supported Static Sitting - Level of Assistance: 7: Independent Dynamic Sitting Balance Dynamic Sitting - Balance Support: During functional activity Dynamic Sitting - Level of Assistance: 6: Modified independent (Device/Increase time) Dynamic Sitting - Balance Activities: Lateral lean/weight shifting;Forward lean/weight shifting;Reaching for objects;Reaching across midline Static Standing Balance Static Standing - Balance Support: During functional activity Static Standing - Level of Assistance: 6: Modified independent (Device/Increase time) Dynamic Standing Balance Dynamic Standing - Balance Support: During functional activity Dynamic Standing - Level of Assistance: 5: Stand by assistance Dynamic Standing -  Balance Activities: Lateral lean/weight shifting;Forward lean/weight shifting;Reaching across midline;Reaching for objects;Compliant surfaces Extremity Assessment  RUE Assessment RUE Assessment: Within Functional Limits LUE Assessment LUE Assessment: Within Functional Limits General Strength Comments: Pt demonstrates PROM WFLS for all joints with increased tone at the elbow and finger flexors.   He demonstrates trace digit flexion with increased flexor tone developing.  No digit extension is noted and he needs max assist to integrate into functional tasks as a stabilizer or active assist. LUE Body System: Neuro Brunstrum levels for arm and hand: Arm Brunstrum level for arm: Stage III Synergy is performed voluntarily Brunstrum level for hand: Stage II Synergy is developing RLE Assessment RLE Assessment: Within Functional Limits LLE Assessment LLE Assessment: Exceptions to [facility] LLE Strength Left Hip Flexion: 3+/5 Left Hip Extension: 3/5 Left Hip ABduction: 4/5 Left Hip ADduction: 4+/5 Left Knee Flexion: 4/5 Left Knee Extension: 4/5 Left Ankle Dorsiflexion: 4/5 Left Ankle  Plantar Flexion: 4+/5 LLE Tone LLE Tone: Within Functional Limits  Skilled Therapeutic Intervention: Pt received seated on EOB; denies pain and agreeable to treatment. Assessed all mobility and balance outcome measures as described above with S overall. Instructed pt in [place] exercise program for continued progress toward goals. Reviewed all education for recommended 24/7 S, reducing the amount of cognitive dual task and environmental distractions/barriers. Pt agreeable to all the above. [name] unable to attend family education session on this date however has attended 3 previous PT sessions where the above information was addressed. Remained seated on EOB at end of session, alarm intact and all needs in reach.    See Function Navigator for Current Functional Status.  Benjiman Core Tygielski 08/21/2017, 9:01 AM

## 2017-08-21 NOTE — Progress Notes (Addendum)
Speech Language Pathology Discharge Summary  Patient Details  Name: [name] [name] MRN: [id number] Date of Birth: [birth year]  Today's Date: 08/21/2017 SLP Individual Time: 1035-1100 SLP Individual Time Calculation (min): 25 min   Skilled Therapeutic Interventions:  Pt was seen for skilled ST targeting completion of family education prior to discharge.  [name] was present and remained actively engaged throughout training.  SLP discussed teams recommendations for 24/7 supervision due to ongoing mobility and cognitive deficits. Sister verbalized understanding but reports that pt will likely be left at home alone for ~2 hour periods.  SLP again reinforced recs for 24/7 supervision but also discussed safety precautions to maximize pt's safety while he is left unsupervised such as leaving pt with urinal close by, toileting pt before leaving, and leaving all needs within pt's reach.  SLP discussed distraction management techniques as well as memory compensatory strategies as well to maximize pt's functional independence for ADLs and IADLs in the home environment.  All questions were answered to their satisfaction at this time.     Patient has met 1 of 4 long term goals.  Patient to discharge at [facility] level.  Reasons goals not met:     Clinical Impression/Discharge Summary:  Pt has made slow but functional gains while inpatient and is discharging having met 1 out of 4 long term goals.  Pt has demonstrated decreased impulsivity but continues to have decreased safety awareness due to poor intellectual awareness of his deficits.  Pt has also demonstrated improved sustained attention to tasks but still needs intermittent cues for redirection to task which impacts all higher level cognitive processes.  Pt and family education is complete at this time for CIR level of care; however, pt will need ongoing education at next level of care due to the extent of his deficits to ensure safety in the home  environment.   Care Partner:  Caregiver Able to Provide Assistance: Yes  Type of Caregiver Assistance: Physical;Cognitive  Recommendation:  Home Health SLP;Outpatient SLP;24 hour supervision/assistance;Skilled Nursing facility  Rationale for SLP Follow Up: Maximize functional communication;Maximize cognitive function and independence;Reduce caregiver burden   Equipment: none recommended by SLP    Reasons for discharge: Discharged from hospital   Patient/Family Agrees with Progress Made and Goals Achieved: Yes   Function:  Eating Eating                Cognition Comprehension Comprehension assist level: Follows basic conversation/direction with no assist  Expression   Expression assist level: Expresses basic needs/ideas: With no assist  Social Interaction Social Interaction assist level: Interacts appropriately 90% of the time - Needs monitoring or encouragement for participation or interaction.  Problem Solving Problem solving assist level: Solves basic 75 - 89% of the time/requires cueing 10 - 24% of the time  Memory Memory assist level: Recognizes or recalls 90% of the time/requires cueing < 10% of the time   Emilio Math 08/21/2017, 12:25 PM

## 2017-08-21 NOTE — Progress Notes (Signed)
[facility]IC[facility]ILITATION     PROGRESS NOTE  Subjective/Complaints:   No issues overnite  ROS: denies CP, SOB, nausea, vomiting, diarrhea.  Objective: Vital Signs: Blood pressure (!) 144/89, pulse 83, temperature 97.8 F (36.6 C), temperature source Oral, resp. rate 19, height 5\' 5"  (1.651 m), weight 72.2 kg (159 lb 2.8 oz), SpO2 94 %. No results found. Recent Labs    08/19/17 0607  WBC 9.3  HGB 15.7  HCT 45.9  PLT 268   Recent Labs    08/19/17 0607  NA 142  K 4.0  CL 108  GLUCOSE 101*  BUN 9  CREATININE 0.96  CALCIUM 8.9   CBG (last 3)  No results for input(s): GLUCAP in the last 72 hours.  Wt Readings from Last 3 Encounters:  08/07/17 72.2 kg (159 lb 2.8 oz)  07/27/17 76.3 kg (168 lb 3.2 oz)  09/07/13 72.2 kg (159 lb 2 oz)    Physical Exam:  BP (!) 144/89 (BP Location: Left Arm)   Pulse 83   Temp 97.8 F (36.6 C) (Oral)   Resp 19   Ht 5\' 5"  (1.651 m)   Wt 72.2 kg (159 lb 2.8 oz)   SpO2 94%   BMI 26.49 kg/m  Constitutional: No distress . Vital signs reviewed. HENT: Normocephalic.  Atraumatic. Eyes: EOMI. No discharge. Cardiovascular: RRR. No JVD. Respiratory: CTA Bilaterally. Normal effort. GI: BS +. Non-distended. Musc: No edema or tenderness in extremities.No wrist pain with ROM, no effusion or erythema, no TTP Neurological: He is alert and oriented..  Follows commands Left facial weakness with mild dysarthria persists.  Left upper extremity: Shoulder abduction 1+/5 (some pain inhibition), elbow flexion/extension 3+-4-/5, wrist extension, hand grip 0/5 (stable) Left lower extremity: 4/5 proximal to distal Emerging tone LUE/LLE Skin: Skin is warm and dry.  Psychiatric: His affect is blunt.   Assessment/Plan: 1. Functional deficits secondary to right MCA infarct  Stable for D/C today F/u PCP in 3-4 weeks F/u PM&R 2 weeks See D/C summary See D/C instructions Function:  Bathing Bathing position   Position: Shower   Bathing parts Body parts bathed by patient: Chest, Abdomen, Left arm, Front perineal area, Buttocks, Right upper leg, Left upper leg, Right lower leg, Left lower leg, Right arm Body parts bathed by helper: Back, Right arm  Bathing assist Assist Level: Supervision or verbal cues      Upper Body Dressing/Undressing Upper body dressing   What is the patient wearing?: Pull over shirt/dress     Pull over shirt/dress - Perfomed by patient: Put head through opening, Thread/unthread right sleeve, Pull shirt over trunk, Thread/unthread left sleeve Pull over shirt/dress - Perfomed by helper: Thread/unthread left sleeve        Upper body assist Assist Level: Supervision or verbal cues      Lower Body Dressing/Undressing Lower body dressing   What is the patient wearing?: Shoes Underwear - Performed by patient: Thread/unthread right underwear leg, Thread/unthread left underwear leg, Pull underwear up/down Underwear - Performed by helper: Pull underwear up/down Pants- Performed by patient: Thread/unthread right pants leg, Thread/unthread left pants leg, Pull pants up/down Pants- Performed by helper: Pull pants up/down Non-skid slipper socks- Performed by patient: Don/doff right sock Non-skid slipper socks- Performed by helper: Don/doff left sock Socks - Performed by patient: Don/doff right sock, Don/doff left sock Socks - Performed by helper: Don/doff left sock Shoes - Performed by patient: Don/doff right shoe, Don/doff left shoe, Fasten right, Fasten left(shoe buttons) Shoes - Performed  by helper: Fasten right, Fasten left          Lower body assist Assist for lower body dressing: Touching or steadying assistance (Pt > 75%)      Toileting Toileting   Toileting steps completed by patient: Performs perineal hygiene, Adjust clothing prior to toileting, Adjust clothing after toileting Toileting steps completed by helper: Adjust clothing after toileting Toileting Assistive Devices: Grab  bar or rail  Toileting assist Assist level: Touching or steadying assistance (Pt.75%)   Transfers Chair/bed transfer   Chair/bed transfer method: Ambulatory Chair/bed transfer assist level: Supervision or verbal cues Chair/bed transfer assistive device: Armrests     Locomotion Ambulation     Max distance: 15 Assist level: Touching or steadying assistance (Pt > 75%)   Wheelchair   Type: Manual Max wheelchair distance: 150 Assist Level: Touching or steadying assistance (Pt > 75%)  Cognition Comprehension Comprehension assist level: Understands basic 75 - 89% of the time/ requires cueing 10 - 24% of the time  Expression Expression assist level: Expresses basic 90% of the time/requires cueing < 10% of the time.  Social Interaction Social Interaction assist level: Interacts appropriately 75 - 89% of the time - Needs redirection for appropriate language or to initiate interaction.  Problem Solving Problem solving assist level: Solves basic 50 - 74% of the time/requires cueing 25 - 49% of the time  Memory Memory assist level: Recognizes or recalls 75 - 89% of the time/requires cueing 10 - 24% of the time    Medical Problem List and Plan: 1.  Left inattention and left sided weakness affecting mobility as well as ability to carry out ADL tasks secondary to right MCA infarct.  D/C home with 24/7 sup today  WHO/PRAFO   Fluoxetine ordered on 6/4 2.  DVT Prophylaxis/Anticoagulation: Pharmaceutical: Lovenox PLT nl 6/17 3. Pain Management: tylenol prn.  4. Mood: LCSW to follow for evaluation and support.  5. Neuropsych: This patient is capable of making decisions on his own behalf. 6. Skin/Wound Care: Routine pressure relief measures.   Keflex completed on 6/12 7. Fluids/Electrolytes/Nutrition: Monitor I/O.   BMP within normal range on 6/10 8. HTN: Monitor BP bid. Was on Lisinopril/HCTZ 20-25 daily at home   Vitals:   08/20/17 2011 08/21/17 0603  BP: (!) 128/93 (!) 144/89  Pulse: 80 83   Resp: 18 19  Temp: 98.3 F (36.8 C) 97.8 F (36.6 C)  SpO2: 95% 94%  BP with fair control f/u with PCP 9. Constipation: Cont bowel meds 10 GERD: Continue Protonix.   11. Tobacco/polysubstance  abuse: Encourage cessation. IS while awake to help with cough.  12. Right ICA stenosis: CEA in ~3weeks  14. Sleep disturbance  Melatonin started on 6/5 increased on 6/7  Trazodone/melatonin timing adjusted to-8 PM per patient request   Improving 15. Dry eyes  Artificial tears started on 6/11  Improving 16.  Left wrist pain likely positional, will order resting hand splint LOS (Days) 16 A FACE TO FACE EVALUATION WAS PERFORMED  Erick Colace 08/21/2017 8:02 AM

## 2017-08-21 NOTE — Progress Notes (Signed)
Occupational Therapy Session Note  Patient Details  Name: [name] [name] MRN: [id number] Date of Birth: [birth year]  Today's Date: 08/21/2017 OT Individual Time: 0905-1000 OT Individual Time Calculation (min): 55 min    Short Term Goals: Week 1:  OT Short Term Goal 1 (Week 1): Pt will complete UB dressing with supervision and min instructional cueing for donning pullover shirt.  OT Short Term Goal 1 - Progress (Week 1): Not met OT Short Term Goal 2 (Week 1): Pt will complete LB bathing with supervision sit to stand.  OT Short Term Goal 2 - Progress (Week 1): Not met OT Short Term Goal 3 (Week 1): Pt will complete toilet transfers with min guard assist using the RW to the elevated toilet. OT Short Term Goal 3 - Progress (Week 1): Not met OT Short Term Goal 4 (Week 1): Pt will complete walk-in shower transfers with min guard assist using the RW to the tub bench. OT Short Term Goal 4 - Progress (Week 1): Not met OT Short Term Goal 5 (Week 1): Pt will use the LUE as an gross assist for holding items to be opened with no more than mod instructional cueing and min assist.  OT Short Term Goal 5 - Progress (Week 1): Not met Week 2:  OT Short Term Goal 1 (Week 2): Pt will complete UB dressing with supervision and min instructional cueing for donning pullover shirt.  OT Short Term Goal 2 (Week 2): Pt will complete LB bathing with supervision sit to stand.  OT Short Term Goal 3 (Week 2): Pt will complete walk-in shower transfers with min guard assist using the RW to the tub bench. OT Short Term Goal 4 (Week 2): Pt will complete toilet transfers with min guard assist using the RW to the elevated toilet. OT Short Term Goal 5 (Week 2): Pt will use the LUE as an gross assist for holding items to be opened with no more than mod instructional cueing and mod assist.  Week 3:     Skilled Therapeutic Interventions/Progress Updates:    1:1 GRAD DAY! Focus on family education with pt and pt's sister. Pt  performed shower and dressing and functional mobility without AD. Discussed not using a RW due to increased safety concerns when using the RW- often forgetting the use of hand splint on the left side. Discussed safety concerns and recommendations for 24 hr supervision- however sister reports he will be by himself from 3pm to 8 pm with only someone checking in on him. Recommended gait belt with close supervision during functional ambulation around the house and in the community. Pt continues to demonstrate decr body and environment awareness on the left requiring cues for safety. Pt also required multiple trials of donning/ threading shirt and pants before successfully donning clothing correctly even with mod cuing.  Sister did reports she has a shower seat he can use in the shower. Discussed only showering when she is home despite him wanting to shower alone. Provided education on safety at home and making sure he has his phone in his right pocket if ever left at home. Pt stood at sink to complete grooming at the sink. Re tapped his left UE with kinesotape.   Therapy Documentation Precautions:  Precautions Precautions: Fall Precaution Comments: improving L inattention, L hemiparesis UE > LE, decreased awareness Restrictions Weight Bearing Restrictions: No General:   Vital Signs: Therapy Vitals Pulse Rate: 88 BP: (!) 140/92 Oxygen Therapy SpO2: 96 % Pain: Pain Assessment Pain Scale:  0-10 Pain Score: 0-No pain    Vision Baseline Vision/History: Wears glasses Wears Glasses: Reading only Patient Visual Report: No change from baseline Perception  Perception: Impaired Inattention/Neglect: (improving L inattention) Praxis Praxis: Intact  See Function Navigator for Current Functional Status.   Therapy/Group: Individual Therapy  Willeen Cass [facility] 08/21/2017, 10:43 AM

## 2017-08-21 NOTE — Progress Notes (Signed)
Pt A/O, no noted distress. Pt has all belongings and ready to go. Pt/sister education completed. Pt was wheeled down to lobby.

## 2017-08-22 ENCOUNTER — Inpatient Hospital Stay (HOSPITAL_COMMUNITY): Payer: Managed Care, Other (non HMO) | Admitting: Occupational Therapy

## 2017-09-12 DIAGNOSIS — I69322 Dysarthria following cerebral infarction: Secondary | ICD-10-CM

## 2017-09-12 DIAGNOSIS — I69318 Other symptoms and signs involving cognitive functions following cerebral infarction: Secondary | ICD-10-CM | POA: Diagnosis not present

## 2017-09-12 DIAGNOSIS — Z9181 History of falling: Secondary | ICD-10-CM

## 2017-09-12 DIAGNOSIS — I10 Essential (primary) hypertension: Secondary | ICD-10-CM

## 2017-09-12 DIAGNOSIS — F17210 Nicotine dependence, cigarettes, uncomplicated: Secondary | ICD-10-CM

## 2017-09-12 DIAGNOSIS — I69354 Hemiplegia and hemiparesis following cerebral infarction affecting left non-dominant side: Secondary | ICD-10-CM

## 2017-09-26 ENCOUNTER — Encounter: Payer: Self-pay | Admitting: Physical Medicine & Rehabilitation

## 2017-09-26 ENCOUNTER — Encounter
Payer: Managed Care, Other (non HMO) | Attending: Physical Medicine & Rehabilitation | Admitting: Physical Medicine & Rehabilitation

## 2017-09-26 VITALS — BP 147/93 | HR 99 | Ht 65.0 in | Wt 156.0 lb

## 2017-09-26 DIAGNOSIS — I10 Essential (primary) hypertension: Secondary | ICD-10-CM | POA: Diagnosis not present

## 2017-09-26 DIAGNOSIS — I63511 Cerebral infarction due to unspecified occlusion or stenosis of right middle cerebral artery: Secondary | ICD-10-CM | POA: Insufficient documentation

## 2017-09-26 DIAGNOSIS — Z720 Tobacco use: Secondary | ICD-10-CM | POA: Diagnosis not present

## 2017-09-26 DIAGNOSIS — G8114 Spastic hemiplegia affecting left nondominant side: Secondary | ICD-10-CM | POA: Diagnosis not present

## 2017-09-26 DIAGNOSIS — Z8042 Family history of malignant neoplasm of prostate: Secondary | ICD-10-CM | POA: Diagnosis not present

## 2017-09-26 DIAGNOSIS — Z833 Family history of diabetes mellitus: Secondary | ICD-10-CM | POA: Diagnosis not present

## 2017-09-26 DIAGNOSIS — S43002S Unspecified subluxation of left shoulder joint, sequela: Secondary | ICD-10-CM

## 2017-09-26 DIAGNOSIS — Z808 Family history of malignant neoplasm of other organs or systems: Secondary | ICD-10-CM | POA: Diagnosis not present

## 2017-09-26 DIAGNOSIS — Z801 Family history of malignant neoplasm of trachea, bronchus and lung: Secondary | ICD-10-CM | POA: Insufficient documentation

## 2017-09-26 DIAGNOSIS — I6521 Occlusion and stenosis of right carotid artery: Secondary | ICD-10-CM | POA: Diagnosis not present

## 2017-09-26 DIAGNOSIS — K219 Gastro-esophageal reflux disease without esophagitis: Secondary | ICD-10-CM | POA: Insufficient documentation

## 2017-09-26 DIAGNOSIS — Z8041 Family history of malignant neoplasm of ovary: Secondary | ICD-10-CM | POA: Insufficient documentation

## 2017-09-26 DIAGNOSIS — F17210 Nicotine dependence, cigarettes, uncomplicated: Secondary | ICD-10-CM | POA: Diagnosis not present

## 2017-09-26 DIAGNOSIS — Z823 Family history of stroke: Secondary | ICD-10-CM | POA: Diagnosis not present

## 2017-09-26 DIAGNOSIS — Z9889 Other specified postprocedural states: Secondary | ICD-10-CM | POA: Diagnosis not present

## 2017-09-26 DIAGNOSIS — Z811 Family history of alcohol abuse and dependence: Secondary | ICD-10-CM | POA: Insufficient documentation

## 2017-09-26 MED ORDER — BACLOFEN 10 MG PO TABS: 5.0000 mg | ORAL_TABLET | Freq: Three times a day (TID) | ORAL | 1 refills | Status: DC

## 2017-09-26 NOTE — Addendum Note (Signed)
Addended by: Maryla MorrowPATEL, Maeson Purohit A on: 09/26/2017 12:14 PM   Modules accepted: Orders

## 2017-09-26 NOTE — Progress Notes (Addendum)
Subjective:    Patient ID: [name] [name], male    DOB: 08/17/1965, 52 y.o.   MRN: [id number]  HPI 52 year old male with history of HTN, Cocaine/ETOH/tobacco abuse presents for hospital follow up after receiving CIR for right MCA infarct.   Admit date: 08/05/2017 Discharge date: 08/21/2017  At discharge, he was instructed to use his walker, which he no longer requires.  Denies falls. His BP is within reference range. Family present, provides history.  He sees Vasc next week. He saw his PCP.  He sees Neurology in Aug. He does not have an appointment with Neurosurg. He started smoking and drinking again.  Denies illicit drug use. Sleep is manageable.  Vision has improved.   Therapies: 2/week DME: previously possessed  Mobility: No assistive device  Pain Inventory Average Pain 2 Pain Right Now 2 My pain is constant and aching  In the last 24 hours, has pain interfered with the following? General activity 0 Relation with others 0 Enjoyment of life 0 What TIME of day is your pain at its worst? na Sleep (in general) Fair  Pain is worse with: na Pain improves with: na Relief from Meds: na  Mobility walk without assistance  Function employed # of hrs/week 40  Neuro/Psych weakness  Prior Studies Any changes since last visit?  no  Physicians involved in your care Any changes since last visit?  no   Family History  Problem Relation Age of Onset  . Prostate cancer Father [id number]  . Lung cancer Father   . Nephrolithiasis Father   . Ovarian cancer Mother   . CVA Paternal Grandmother        in her 8250's  . Cancer Paternal Grandmother        eye  . Diabetes Maternal Grandmother   . Alcoholism Maternal Uncle        x 3   Social History   Socioeconomic History  . Marital status: Single    Spouse name: Not on file  . Number of children: 0  . Years of education: Not on file  . Highest education level: Not on file  Occupational History  . Occupation: Stage managerrigging  Social Needs    . Financial resource strain: Not on file  . Food insecurity:    Worry: Not on file    Inability: Not on file  . Transportation needs:    Medical: Not on file    Non-medical: Not on file  Tobacco Use  . Smoking status: Current Every Day Smoker    Packs/day: 1.00    Years: 25.00    Pack years: 25.00    Types: Cigarettes  . Smokeless tobacco: Never Used  Substance and Sexual Activity  . Alcohol use: Yes  . Drug use: Not Currently    Types: Marijuana  . Sexual activity: Not on file  Lifestyle  . Physical activity:    Days per week: Not on file    Minutes per session: Not on file  . Stress: Not on file  Relationships  . Social connections:    Talks on phone: Not on file    Gets together: Not on file    Attends religious service: Not on file    Active member of club or organization: Not on file    Attends meetings of clubs or organizations: Not on file    Relationship status: Not on file  Other Topics Concern  . 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 CYSTOSCOPY W/ URETERAL STENT PLACEMENT Bilateral 09/07/2013   Procedure: CYSTOSCOPY WITH RETROGRADE PYELOGRAM/URETERAL STENT PLACEMENT;  Surgeon: Sebastian Ache, MD;  Location: [facility];  Service: Urology;  Laterality: Bilateral;  . CYSTOSCOPY WITH RETROGRADE PYELOGRAM, URETEROSCOPY AND STENT PLACEMENT Bilateral 09/09/2013   Procedure: CYSTOSCOPY WITH RETROGRADE PYELOGRAM, DIAGNOSTIC URETEROSCOPY AND STENT CHANGE;  Surgeon: Sebastian Ache, MD;  Location: [facility];  Service: Urology;  Laterality: Bilateral;  . HOLMIUM LASER APPLICATION Right 09/07/2013   Procedure: HOLMIUM LASER APPLICATION;  Surgeon: Sebastian Ache, MD;  Location: [facility];  Service: Urology;  Laterality: Right;  . HOLMIUM LASER APPLICATION Right 09/09/2013   Procedure: HOLMIUM LASER APPLICATION;  Surgeon: Sebastian Ache, MD;   Location: [facility];  Service: Urology;  Laterality: Right;  . NEPHROLITHOTOMY Right 09/07/2013   Procedure: FIRST STAGE RIGHT PERCUTANEOUS NEPHROLITHOTOMY  WITH ACCESS, ;  Surgeon: Sebastian Ache, MD;  Location: [facility];  Service: Urology;  Laterality: Right;  . NEPHROLITHOTOMY Right 09/09/2013   Procedure: RIGHT 2ND STAGE NEPHROLITHOTOMY PERCUTANEOUS;  Surgeon: Sebastian Ache, MD;  Location: [facility];  Service: Urology;  Laterality: Right;  . UMBILICAL HERNIA REPAIR     Past Medical History:  Diagnosis Date  . GERD (gastroesophageal reflux disease)   . Hypertension   . Nephrolithiasis   . Tobacco abuse    BP (!) 147/93   Pulse 99   Ht 5\' 5"  (1.651 m)   Wt 156 lb (70.8 kg)   SpO2 93%   BMI 25.96 kg/m   Opioid Risk Score:   Fall Risk Score:  `1  Depression screen PHQ 2/9  No flowsheet data found.   Review of Systems  Constitutional: Negative.   HENT: Negative.   Eyes: Negative.   Respiratory: Negative.   Cardiovascular: Negative.   Gastrointestinal: Negative.   Endocrine: Negative.   Genitourinary: Negative.   Musculoskeletal: Negative.   Skin: Negative.   Allergic/Immunologic: Negative.   Neurological: Positive for weakness.  Hematological: Negative.   Psychiatric/Behavioral: Negative.   All other systems reviewed and are negative.     Objective:   Physical Exam Constitutional: No distress . Vital signs reviewed. HENT: Normocephalic.  Atraumatic. Eyes: EOMI. No discharge. Cardiovascular: RRR. No JVD. Respiratory: CTA Bilaterally. Normal effort. GI: BS +. Non-distended. Musc: Left shoulder pain with subluxation Neurological: He is alert and oriented..  Follows commands Left facial weakness with mild dysarthria.  Left upper extremity: Shoulder abduction 3/5 (some pain inhibition), elbow flexion 3+-4-/5, elbow extension 2+/5, 2-/5 wrist extension, hand grip 2/5  Left lower extremity: 4-4+/5 proximal to distal mAS 1/4 finger flexors Skin: Skin is warm and dry.    Psychiatric: His affect is blunt, improving.     Assessment & Plan:  52 year old male with history of HTN, Cocaine/ETOH/tobacco abuse presents for hospital follow up after receiving CIR for right MCA infarct.   1. Left inattention and left sided weakness with spasticity affecting mobility as well as ability to carry out ADL tasks secondary to right MCA infarct.  Cont therapies  Cont follow up with Neurology  Cont WHO  Will order Baclofen 5 TID  Discussed gradual return to driving  NMES for shoulder subluxation, will order, Kineseotaping with therapies  Will consider SPRINT  2. HTN  Maintain SBP 120-160  Cont meds  2. Tobacco/polysubstance abuse:   Continues to smoke/Etoh, discussed with patient  3. Right  ICA stenosis:   Follow up with Vascular

## 2017-10-01 ENCOUNTER — Telehealth: Payer: Self-pay | Admitting: *Deleted

## 2017-10-01 ENCOUNTER — Encounter: Payer: Self-pay | Admitting: Physical Medicine & Rehabilitation

## 2017-10-01 NOTE — Telephone Encounter (Signed)
Rosey Batheresa, Mr Hern's sister called and says she just found out today (and apologizes for late notice) but she says the attorney is requesting a letter from Dr Allena KatzPatel stating that the patient is not able to drive himself at this time and will not be able to attend the court date tomorrow.  (It looks like his Mychart is active so could be sent through that though it would not be signed. ) she did not leave a fax number, but states they need the letter today.

## 2017-10-01 NOTE — Progress Notes (Signed)
Established Carotid Patient   History of Present Illness   [name] [name] is a 52 y.o. (1965/06/28) male who presents with chief complaint: recovering from stroke.  Patient was seen in the hospital on 07/28/17 after a large R MCA CVA.  The patient had significant neuro deficits and had some progression in neurologic deficits while admitted.  Pt was discharges from rehab on 08/21/17.  Previous CTA Neck demonstrated: RICA sub-total occlusion stenosis, LICA minimal stenosis.  The patient's neurologic deficits include: left arm weakness.  Some residual gait change but able to walk unaided at this point..  The patient's PMH, PSH, SH, and FamHx were reviewed on 10/02/17 are unchanged from 07/29/17   Current Outpatient Medications  Medication Sig Dispense Refill  . aspirin EC 325 MG EC tablet Take 1 tablet (325 mg total) by mouth daily. 30 tablet 0  . atorvastatin (LIPITOR) 40 MG tablet Take 1 tablet (40 mg total) by mouth daily at 6 PM. 30 tablet 0  . baclofen (LIORESAL) 10 MG tablet Take 0.5 tablets (5 mg total) by mouth 3 (three) times daily. 45 tablet 1  . FLUoxetine (PROZAC) 10 MG capsule Take 1 capsule (10 mg total) by mouth daily at 8 pm. 30 capsule 0  . folic acid (FOLVITE) 1 MG tablet Take 1 tablet (1 mg total) by mouth daily. 30 tablet 0  . hydrochlorothiazide (MICROZIDE) 12.5 MG capsule Take 1 capsule (12.5 mg total) by mouth daily. 30 capsule 0  . Melatonin 3 MG TABS Take 1 tablet (3 mg total) by mouth daily at 8 pm. 30 tablet 0  . nicotine (NICODERM CQ - DOSED IN MG/24 HOURS) 14 mg/24hr patch Place 1 patch (14 mg total) onto the skin daily after supper. 28 patch 0  . omeprazole (PRILOSEC) 40 MG capsule Take 40 mg by mouth daily.    Marland Kitchen senna-docusate (SENOKOT-S) 8.6-50 MG tablet Take 2 tablets by mouth daily at 8 pm. 60 tablet 0  . traZODone (DESYREL) 50 MG tablet Take 0.5-1 tablets (25-50 mg total) by mouth daily at 8 pm. 30 tablet 0   No current facility-administered medications for  this visit.     On ROS today: left arm weakness, some contracture in left arm   Physical Examination   Vitals:   10/02/17 1038 10/02/17 1039  BP: (!) 143/90 (!) 141/95  Pulse: 96   Resp: 20   SpO2: 97%   Weight: 155 lb 3.2 oz (70.4 kg)   Height: 5\' 5"  (1.651 m)    Body mass index is 25.83 kg/m.  General Alert, O x 3, WD, NAD  Neck Supple, mid-line trachea,    Pulmonary Sym exp, good B air movt, CTA B  Cardiac RRR, Nl S1, S2, no Murmurs, No rubs, No S3,S4  Vascular Vessel Right Left  Radial Palpable Palpable  Brachial Palpable Palpable  Carotid Palpable, No Bruit Palpable, No Bruit  Aorta Not palpable N/A  Femoral Palpable Palpable  Popliteal Not palpable Not palpable  PT Palpable Palpable  DP Palpable Palpable    Gastro- intestinal soft, non-distended, non-tender to palpation, No guarding or rebound, no HSM, no masses, no CVAT B, No palpable prominent aortic pulse,    Musculo- skeletal M/S 5/5 throughout except LUE 3/5, Extremities without ischemic changes  , non-fluid gait  Neurologic Cranial nerves 2-12 intact , Pain and light touch intact in extremities , Motor exam as listed above    Medical Decision Making   TERRIAN SENTELL is a 52 y.o.  male who presents with: large R CVA with dense neuro deficits, R ICA sub-total oclcusion, intracranial disease, cocaine use   Given the prior sub-total R ICA stenosis on CTA, I would not be surprised if the R ICA is occluded.    I would start with B carotid duplex.  If the R ICA remains patent, would proceed with R CEA vs CAS.  The patient will follow up with us in 4 weeks with the study.  Care for this patient will be assumed by one of partners as I will be transitioning to another practice..  I discussed in depth with the patient the nature of atherosclerosis, and emphasized the importance of maximal medical management including strict control of blood pressure, blood glucose, and lipid levels, antiplatelet agents, obtaining  regular exercise, and cessation of smoking.    The patient is aware that without maximal medical management the underlying atherosclerotic disease process will progress, limiting the benefit of any interventions.  The patient is currently on a statin: Lipitor.   The patient is currently on an anti-platelet: ASA.  Thank you for allowing us to participate in this patient's care.   Leonides SakeBrian Chen, MD, FACS [facility] Office: [phone] Pager: [phone]

## 2017-10-01 NOTE — Telephone Encounter (Signed)
We can write a letter stating the same.  Thanks.

## 2017-10-02 ENCOUNTER — Other Ambulatory Visit: Payer: Self-pay

## 2017-10-02 ENCOUNTER — Ambulatory Visit (INDEPENDENT_AMBULATORY_CARE_PROVIDER_SITE_OTHER): Payer: Managed Care, Other (non HMO) | Admitting: Vascular Surgery

## 2017-10-02 ENCOUNTER — Encounter: Payer: Self-pay | Admitting: Vascular Surgery

## 2017-10-02 ENCOUNTER — Encounter: Payer: Self-pay | Admitting: *Deleted

## 2017-10-02 VITALS — BP 141/95 | HR 96 | Resp 20 | Ht 65.0 in | Wt 155.2 lb

## 2017-10-02 DIAGNOSIS — I6521 Occlusion and stenosis of right carotid artery: Secondary | ICD-10-CM

## 2017-10-02 DIAGNOSIS — I63511 Cerebral infarction due to unspecified occlusion or stenosis of right middle cerebral artery: Secondary | ICD-10-CM

## 2017-10-02 NOTE — Telephone Encounter (Signed)
I sent a text to Dr. Allena KatzPatel asking for permission to craft the letter and use a stamped signature. Dr. Allena KatzPatel sent a text back indicating that it would be fine. I contacted patients caregiver and she provided a fax number. I crafted a letter and faxed it to the number provided

## 2017-10-03 ENCOUNTER — Telehealth: Payer: Self-pay | Admitting: *Deleted

## 2017-10-03 DIAGNOSIS — I63511 Cerebral infarction due to unspecified occlusion or stenosis of right middle cerebral artery: Secondary | ICD-10-CM

## 2017-10-03 NOTE — Telephone Encounter (Signed)
[email or web address]  [facility] and it is recommended to continue with Outpatient Physical Therapy.  [facility].  Fax: [phone]. Please call Harriett Sineancy when this is completed

## 2017-10-03 NOTE — Telephone Encounter (Signed)
We may refer him.  Thanks.

## 2017-10-04 NOTE — Addendum Note (Signed)
Addended by: Angela NevinWESSLING, Adis Sturgill D on: 10/04/2017 08:33 AM   Modules accepted: Orders

## 2017-10-04 NOTE — Telephone Encounter (Signed)
Order entered, printed, signed, and placed on Sigmund HazelLisa Miller (referral coordinators) desk. Notified Harriett SineNancy, OT, [facility] as requested

## 2017-10-17 ENCOUNTER — Encounter: Payer: Self-pay | Admitting: Neurology

## 2017-10-17 ENCOUNTER — Telehealth: Payer: Self-pay | Admitting: Neurology

## 2017-10-17 ENCOUNTER — Ambulatory Visit: Payer: Managed Care, Other (non HMO) | Admitting: Neurology

## 2017-10-17 VITALS — BP 139/72 | HR 92 | Ht 65.0 in | Wt 149.4 lb

## 2017-10-17 DIAGNOSIS — I63239 Cerebral infarction due to unspecified occlusion or stenosis of unspecified carotid arteries: Secondary | ICD-10-CM | POA: Diagnosis not present

## 2017-10-17 DIAGNOSIS — G8110 Spastic hemiplegia affecting unspecified side: Secondary | ICD-10-CM | POA: Diagnosis not present

## 2017-10-17 NOTE — Patient Instructions (Addendum)
I had a long d/w patient and his wife about his recent stroke,carotid stenosis, risk for recurrent stroke/TIAs, personally independently reviewed imaging studies and stroke evaluation results and answered questions.Continue aspirin 325 mg daily  for secondary stroke prevention and maintain strict control of hypertension with blood pressure goal below 130/90, diabetes with hemoglobin A1c goal below 6.5% and lipids with LDL cholesterol goal below 70 mg/dL. I also advised the patient to eat a healthy diet with plenty of whole grains, cereals, fruits and vegetables, exercise regularly and maintain ideal body weight. Check follow-up CT angiogram of the neck and if right carotid is still patent may need right carotid revascularization. I advised him to increase baclofen to 10 mg 3 times daily to help with his spasticity and spasms. The patient was counseled to quit smoking and states he is trying and will do so. of Followup in the future with my nurse practitioner Shanda BumpsJessica in 3 months or call earlier if necessary  Stroke Prevention Some medical conditions and behaviors are associated with a higher chance of having a stroke. You can help prevent a stroke by making nutrition, lifestyle, and other changes, including managing any medical conditions you may have. What nutrition changes can be made?  Eat healthy foods. You can do this by: ? Choosing foods high in fiber, such as fresh fruits and vegetables and whole grains. ? Eating at least 5 or more servings of fruits and vegetables a day. Try to fill half of your plate at each meal with fruits and vegetables. ? Choosing lean protein foods, such as lean cuts of meat, poultry without skin, fish, tofu, beans, and nuts. ? Eating low-fat dairy products. ? Avoiding foods that are high in salt (sodium). This can help lower blood pressure. ? Avoiding foods that have saturated fat, trans fat, and cholesterol. This can help prevent high cholesterol. ? Avoiding processed and  premade foods.  Follow your health care provider's specific guidelines for losing weight, controlling high blood pressure (hypertension), lowering high cholesterol, and managing diabetes. These may include: ? Reducing your daily calorie intake. ? Limiting your daily sodium intake to 1,500 milligrams (mg). ? Using only healthy fats for cooking, such as olive oil, canola oil, or sunflower oil. ? Counting your daily carbohydrate intake. What lifestyle changes can be made?  Maintain a healthy weight. Talk to your health care provider about your ideal weight.  Get at least 30 minutes of moderate physical activity at least 5 days a week. Moderate activity includes brisk walking, biking, and swimming.  Do not use any products that contain nicotine or tobacco, such as cigarettes and e-cigarettes. If you need help quitting, ask your health care provider. It may also be helpful to avoid exposure to secondhand smoke.  Limit alcohol intake to no more than 1 drink a day for nonpregnant women and 2 drinks a day for men. One drink equals 12 oz of beer, 5 oz of wine, or 1 oz of hard liquor.  Stop any illegal drug use.  Avoid taking birth control pills. Talk to your health care provider about the risks of taking birth control pills if: ? You are over 52 years old. ? You smoke. ? You get migraines. ? You have ever had a blood clot. What other changes can be made?  Manage your cholesterol levels. ? Eating a healthy diet is important for preventing high cholesterol. If cholesterol cannot be managed through diet alone, you may also need to take medicines. ? Take any prescribed medicines  to control your cholesterol as told by your health care provider.  Manage your diabetes. ? Eating a healthy diet and exercising regularly are important parts of managing your blood sugar. If your blood sugar cannot be managed through diet and exercise, you may need to take medicines. ? Take any prescribed medicines to  control your diabetes as told by your health care provider.  Control your hypertension. ? To reduce your risk of stroke, try to keep your blood pressure below 130/80. ? Eating a healthy diet and exercising regularly are an important part of controlling your blood pressure. If your blood pressure cannot be managed through diet and exercise, you may need to take medicines. ? Take any prescribed medicines to control hypertension as told by your health care provider. ? Ask your health care provider if you should monitor your blood pressure at home. ? Have your blood pressure checked every year, even if your blood pressure is normal. Blood pressure increases with age and some medical conditions.  Get evaluated for sleep disorders (sleep apnea). Talk to your health care provider about getting a sleep evaluation if you snore a lot or have excessive sleepiness.  Take over-the-counter and prescription medicines only as told by your health care provider. Aspirin or blood thinners (antiplatelets or anticoagulants) may be recommended to reduce your risk of forming blood clots that can lead to stroke.  Make sure that any other medical conditions you have, such as atrial fibrillation or atherosclerosis, are managed. What are the warning signs of a stroke? The warning signs of a stroke can be easily remembered as BEFAST.  B is for balance. Signs include: ? Dizziness. ? Loss of balance or coordination. ? Sudden trouble walking.  E is for eyes. Signs include: ? A sudden change in vision. ? Trouble seeing.  F is for face. Signs include: ? Sudden weakness or numbness of the face. ? The face or eyelid drooping to one side.  A is for arms. Signs include: ? Sudden weakness or numbness of the arm, usually on one side of the body.  S is for speech. Signs include: ? Trouble speaking (aphasia). ? Trouble understanding.  T is for time. ? These symptoms may represent a serious problem that is an emergency.  Do not wait to see if the symptoms will go away. Get medical help right away. Call your local emergency services (911 in the [place] Do not drive yourself to the hospital.  Other signs of stroke may include: ? A sudden, severe headache with no known cause. ? Nausea or vomiting. ? Seizure.  Where to find more information: For more information, visit:  American Stroke Association: www.strokeassociation.org  National Stroke Association: www.stroke.org  Summary  You can prevent a stroke by eating healthy, exercising, not smoking, limiting alcohol intake, and managing any medical conditions you may have.  Do not use any products that contain nicotine or tobacco, such as cigarettes and e-cigarettes. If you need help quitting, ask your health care provider. It may also be helpful to avoid exposure to secondhand smoke.  Remember BEFAST for warning signs of stroke. Get help right away if you or a loved one has any of these signs. This information is not intended to replace advice given to you by your health care provider. Make sure you discuss any questions you have with your health care provider. Document Released: 03/29/2004 Document Revised: 03/27/2016 Document Reviewed: 03/27/2016 Elsevier Interactive Patient Education  [employer]2018 Elsevier Inc.

## 2017-10-17 NOTE — Telephone Encounter (Signed)
[employer] order sent to [facility]. They obtain the auth and will reach out to the pt to schedule.  °

## 2017-10-17 NOTE — Progress Notes (Signed)
[facility] [address] [place]. [place] [address] [phone]       OFFICE FOLLOW-UP NOTE  [name] Date of Birth:  09/10/1965 Medical Record Number:  [id number]   HPI: [name] is a 52 year old Caucasian male seen today for initial office follow-up visit after hospital admission for stroke in May 2019. He is accompanied by his wife. History is obtained from them and review of electronic medical records. I personally reviewed imaging films. The patient woke up on 07/27/88 and with left hemiplegia but refused to go to the year until that evening. Exam he had dense left hemiplegia and CT scan showed hypodensity in the right MCA with aspects score of 3-4. CT venogram showed high-grade right ICA stenosis with right distal M1 occlusion. Infarct core was 69 mL with a penumbra of 133 mL favorable ratio 1.9. NIH stroke scale was 13. Patient was not considered a candidate for intervention.Marland Kitchen MRI scan showed a large right MCA infarct with cytotoxic edema. He was kept in ICU initially and treated with hypertonic saline. After his condition stabilizes transferred out. LDL cholesterol is 106 mg percent. Urine drug screen was positive for cocaine. He also had history of long-standing tobacco abuse. Transthoracic echo showed normal ejection fraction. Hemoglobin A1c was 5.0. Vascular surgery was consulted and recommended elective Reconsideration for carotid surgery after his recovery from rehabilitation. Patient transferred to inpatient rehabilitation where he spent several weeks and is presently living at home. Is able to ablate independently. His left leg strength is improved though he still has left foot drop and walks dragging his left leg. He's had no falls or injuries. His left upper extremity strength is improving but he still has significant weakness particularly in his grip and hand. He is currently point spitting and outpatient physical therapy in [place]. He has cut back smoking  but has not quit completely. He is tolerating aspirin well without bruising or bleeding. His blood pressure is well controlled and today it is 139/72. Is tolerating Lipitor well without muscle aches and pains. He complains of spasms in his legs and has been started on baclofen but has been taking it only twice daily. He did see Dr. Imogene Burn for outpatient follow-up to discuss carotid surgery and plan is to obtain a follow-up vascular imaging study but that has not yet been done.  ROS:   14 system review of systems is positive for  aching muscles, gait difficulty, hand weakness and all the systems negative  PMH:  Past Medical History:  Diagnosis Date  . Carotid artery occlusion   . GERD (gastroesophageal reflux disease)   . Hypertension   . Nephrolithiasis   . Stroke (HCC)   . Tobacco abuse     Social History:  Social History   Socioeconomic History  . Marital status: Single    Spouse name: Not on file  . Number of children: 0  . Years of education: Not on file  . Highest education level: Not on file  Occupational History  . Occupation: Stage manager  Social Needs  . Financial resource strain: Not on file  . Food insecurity:    Worry: Not on file    Inability: Not on file  . Transportation needs:    Medical: Not on file    Non-medical: Not on file  Tobacco Use  . Smoking status: Current Every Day Smoker    Packs/day: 0.50    Years: 25.00    Pack years: 12.50    Types: Cigarettes  .  Smokeless tobacco: Never Used  . Tobacco comment: pt trying to quit  Substance and Sexual Activity  . Alcohol use: Yes  . Drug use: Not Currently    Types: Marijuana    Comment: none at this time  . Sexual activity: Not on file  Lifestyle  . Physical activity:    Days per week: Not on file    Minutes per session: Not on file  . Stress: Not on file  Relationships  . Social connections:    Talks on phone: Not on file    Gets together: Not on file    Attends religious service: Not on file     Active member of club or organization: Not on file    Attends meetings of clubs or organizations: Not on file    Relationship status: Not on file  . Intimate partner violence:    Fear of current or ex partner: Not on file    Emotionally abused: Not on file    Physically abused: Not on file    Forced sexual activity: Not on file  Other Topics Concern  . 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 aspirin EC 325 MG EC tablet Take 1 tablet (325 mg total) by mouth daily. 30 tablet 0  . atorvastatin (LIPITOR) 40 MG tablet Take 1 tablet (40 mg total) by mouth daily at 6 PM. 30 tablet 0  . baclofen (LIORESAL) 10 MG tablet Take 0.5 tablets (5 mg total) by mouth 3 (three) times daily. 45 tablet 1  . famotidine (PEPCID) 20 MG tablet TAKE 1 TABLET BY MOUTH DAILY AS NEEDED FOR STOMACH  1  . FLUoxetine (PROZAC) 10 MG capsule Take 1 capsule (10 mg total) by mouth daily at 8 pm. 30 capsule 0  . folic acid (FOLVITE) 1 MG tablet Take 1 tablet (1 mg total) by mouth daily. 30 tablet 0  . hydrochlorothiazide (HYDRODIURIL) 12.5 MG tablet     . hydrochlorothiazide (MICROZIDE) 12.5 MG capsule Take 1 capsule (12.5 mg total) by mouth daily. 30 capsule 0  . lisinopril (PRINIVIL,ZESTRIL) 5 MG tablet TAKE 1 TABLET BY MOUTH FOR BLOOD PRESSURE  2  . Melatonin 3 MG TABS Take 1 tablet (3 mg total) by mouth daily at 8 pm. 30 tablet 0  . nicotine (NICODERM CQ - DOSED IN MG/24 HOURS) 14 mg/24hr patch Place 1 patch (14 mg total) onto the skin daily after supper. 28 patch 0  . NICOTINE STEP 3 7 MG/24HR patch APPLY 1 PATCH ONTO THE SKIN ONCE DAILY AFTER SUPPER  0  . omeprazole (PRILOSEC) 40 MG capsule Take 40 mg by mouth daily.    Marland Kitchen. senna-docusate (SENOKOT-S) 8.6-50 MG tablet Take 2 tablets by mouth daily at 8 pm. 60 tablet 0  .  traZODone (DESYREL) 50 MG tablet Take 0.5-1 tablets (25-50 mg total) by mouth daily at 8 pm. 30 tablet 0   No current facility-administered medications on file prior to visit.     Allergies:  No Known Allergies  Physical Exam General: well developed, well nourished middle-age Caucasian male, seated, in no evident distress Head: head normocephalic and atraumatic.  Neck: supple with soft right carotid   bruit Cardiovascular: regular rate and rhythm, no murmurs Musculoskeletal: no deformity Skin:  no rash/petichiae  Vascular:  Normal pulses all extremities Vitals:   10/17/17 0920  BP: 139/72  Pulse: 92   Neurologic Exam Mental Status: Awake and fully alert. Oriented to place and time. Recent and remote memory intact. Attention span, concentration and fund of knowledge appropriate. Mood and affect appropriate.  Cranial Nerves: Fundoscopic exam reveals sharp disc margins. Pupils unequal with right being slightly smaller than the left, briskly reactive to light. Extraocular movements full without nystagmus. Visual fields full to confrontation. Hearing intact. Facial sensation intact. Mild left lower facial weakness., tongue, palate moves normally and symmetrically.  Motor: Normal bulk and tone. Normal strength in all tested extremity muscles on the right side only. spastic left hemiparesis with 3/5 left upper extremity strength proximally and significant weakness of left grip and intrinsic hand muscles. Non-fixed flexion contracture of the left fingers. 4/5 left lower extremity strength in the hips and knees with left ankle foot drop with 2/5 ankle dorsiflexor strength on the left. Tone is increased on the left compared to the right. Sensory.: intact to touch ,pinprick .position and vibratory sensation.  Coordination: Rapid alternating movements normal in all extremities. Finger-to-nose and heel-to-shin performed accurately bilaterally. Gait and Station: Arises from chair without difficulty.  Stance is normal. Gait demonstrates spastic hemiplegic gait with left foot drop and dragging of the left leg with circumduction  Reflexes: 2+ and asymmetric brisker on the left. Toes downgoing.   NIHSS  3 Modified Rankin 3  ASSESSMENT: 52 year old Caucasian male with right MCA infarct due to symptomatic high-grade proximal right carotid stenosis in May 2019 with residual spastic left hemiplegia. Vascular risk factors of smoking, hypertension, hyperlipidemia and carotid stenosis     PLAN: I had a long d/w patient and his wife about his recent stroke,carotid stenosis, risk for recurrent stroke/TIAs, personally independently reviewed imaging studies and stroke evaluation results and answered questions.Continue aspirin 325 mg daily  for secondary stroke prevention and maintain strict control of hypertension with blood pressure goal below 130/90, diabetes with hemoglobin A1c goal below 6.5% and lipids with LDL cholesterol goal below 70 mg/dL. I also advised the patient to eat a healthy diet with plenty of whole grains, cereals, fruits and vegetables, exercise regularly and maintain ideal body weight. Check follow-up CT angiogram of the neck and if right carotid is still patent may need right carotid revascularization. I advised him to increase baclofen to 10 mg 3 times daily to help with his spasticity and spasms. The patient was counseled to quit smoking and states he is trying and will do so. of Followup in the future with my nurse practitioner Shanda BumpsJessica in 3 months or call earlier if necessary Greater than 50% of time during this 25 minute visit was spent on counseling,explanation of diagnosis of carotid stenosis and spastic hemiplegia, planning of further management, discussion with patient and family and coordination of care Delia HeadyPramod Sethi, MD  [facility] [address] [place], [place] [id number]  Phone [phone] Fax [phone] Note: This document was prepared  with digital dictation and possible smart phrase technology. Any transcriptional errors that result from this process are unintentional

## 2017-10-24 ENCOUNTER — Encounter
Payer: Managed Care, Other (non HMO) | Attending: Physical Medicine & Rehabilitation | Admitting: Physical Medicine & Rehabilitation

## 2017-10-24 ENCOUNTER — Encounter: Payer: Self-pay | Admitting: Physical Medicine & Rehabilitation

## 2017-10-24 VITALS — BP 148/88 | HR 68 | Ht 65.0 in | Wt 152.1 lb

## 2017-10-24 DIAGNOSIS — G8114 Spastic hemiplegia affecting left nondominant side: Secondary | ICD-10-CM | POA: Diagnosis not present

## 2017-10-24 DIAGNOSIS — F17210 Nicotine dependence, cigarettes, uncomplicated: Secondary | ICD-10-CM | POA: Insufficient documentation

## 2017-10-24 DIAGNOSIS — I63511 Cerebral infarction due to unspecified occlusion or stenosis of right middle cerebral artery: Secondary | ICD-10-CM | POA: Insufficient documentation

## 2017-10-24 DIAGNOSIS — Z9889 Other specified postprocedural states: Secondary | ICD-10-CM | POA: Diagnosis not present

## 2017-10-24 DIAGNOSIS — Z8042 Family history of malignant neoplasm of prostate: Secondary | ICD-10-CM | POA: Insufficient documentation

## 2017-10-24 DIAGNOSIS — K219 Gastro-esophageal reflux disease without esophagitis: Secondary | ICD-10-CM | POA: Diagnosis not present

## 2017-10-24 DIAGNOSIS — Z801 Family history of malignant neoplasm of trachea, bronchus and lung: Secondary | ICD-10-CM | POA: Insufficient documentation

## 2017-10-24 DIAGNOSIS — I6521 Occlusion and stenosis of right carotid artery: Secondary | ICD-10-CM | POA: Insufficient documentation

## 2017-10-24 DIAGNOSIS — Z823 Family history of stroke: Secondary | ICD-10-CM | POA: Diagnosis not present

## 2017-10-24 DIAGNOSIS — Z808 Family history of malignant neoplasm of other organs or systems: Secondary | ICD-10-CM | POA: Diagnosis not present

## 2017-10-24 DIAGNOSIS — Z811 Family history of alcohol abuse and dependence: Secondary | ICD-10-CM | POA: Insufficient documentation

## 2017-10-24 DIAGNOSIS — Z8041 Family history of malignant neoplasm of ovary: Secondary | ICD-10-CM | POA: Diagnosis not present

## 2017-10-24 DIAGNOSIS — Z720 Tobacco use: Secondary | ICD-10-CM

## 2017-10-24 DIAGNOSIS — Z833 Family history of diabetes mellitus: Secondary | ICD-10-CM | POA: Diagnosis not present

## 2017-10-24 DIAGNOSIS — S43002S Unspecified subluxation of left shoulder joint, sequela: Secondary | ICD-10-CM

## 2017-10-24 DIAGNOSIS — I10 Essential (primary) hypertension: Secondary | ICD-10-CM | POA: Insufficient documentation

## 2017-10-24 MED ORDER — BACLOFEN 10 MG PO TABS: 10.0000 mg | ORAL_TABLET | Freq: Three times a day (TID) | ORAL | 1 refills | Status: DC

## 2017-10-24 NOTE — Progress Notes (Signed)
Subjective:    Patient ID: [name] [name], male    DOB: 01/15/1966, 52 y.o.   MRN: [id number]  HPI 52 year old male with history of HTN, Cocaine/ETOH/tobacco abuse presents for follow up for right MCA infarct.   Last clinic visit 09/26/17.  Sister supplements history. Since that time, he continues to be in therapies.  He is following up with Neuro. He did not have side effects with Baclofen. He is not driving, was not able to react. BP is elevated, but it is being adjusted by PCP. He smoke 1/2 pack per week. He is scheduled to have vascular.  He did well with kineoseotaping shoulder.  Pain Inventory Average Pain 5 Pain Right Now 5 My pain is aching  In the last 24 hours, has pain interfered with the following? General activity 4 Relation with others 4 Enjoyment of life 9 What TIME of day is your pain at its worst? na Sleep (in general) Fair  Pain is worse with: standing Pain improves with: na Relief from Meds: na  Mobility walk without assistance  Function employed # of hrs/week 40  Neuro/Psych weakness  Prior Studies Any changes since last visit?  no  Physicians involved in your care Any changes since last visit?  no   Family History  Problem Relation Age of Onset  . Prostate cancer Father [id number]  . Lung cancer Father   . Nephrolithiasis Father   . Ovarian cancer Mother   . CVA Paternal Grandmother        in her 8550's  . Cancer Paternal Grandmother        eye  . Stroke Paternal Grandmother   . Diabetes Maternal Grandmother   . Alcoholism Maternal Uncle        x 3   Social History   Socioeconomic History  . Marital status: Single    Spouse name: Not on file  . Number of children: 0  . Years of education: Not on file  . Highest education level: Not on file  Occupational History  . Occupation: Stage managerrigging  Social Needs  . Financial resource strain: Not on file  . Food insecurity:    Worry: Not on file    Inability: Not on file  . Transportation needs:   Medical: Not on file    Non-medical: Not on file  Tobacco Use  . Smoking status: Current Every Day Smoker    Packs/day: 0.50    Years: 25.00    Pack years: 12.50    Types: Cigarettes  . Smokeless tobacco: Never Used  . Tobacco comment: pt trying to quit  Substance and Sexual Activity  . Alcohol use: Yes  . Drug use: Not Currently    Types: Marijuana    Comment: none at this time  . Sexual activity: Not on file  Lifestyle  . Physical activity:    Days per week: Not on file    Minutes per session: Not on file  . Stress: Not on file  Relationships  . Social connections:    Talks on phone: Not on file    Gets together: Not on file    Attends religious service: Not on file    Active member of club or organization: Not on file    Attends meetings of clubs or organizations: Not on file    Relationship status: Not on file  Other Topics Concern  . 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 CYSTOSCOPY W/ URETERAL STENT PLACEMENT Bilateral 09/07/2013   Procedure: CYSTOSCOPY WITH RETROGRADE PYELOGRAM/URETERAL STENT PLACEMENT;  Surgeon: Sebastian Ache, MD;  Location: [facility];  Service: Urology;  Laterality: Bilateral;  . CYSTOSCOPY WITH RETROGRADE PYELOGRAM, URETEROSCOPY AND STENT PLACEMENT Bilateral 09/09/2013   Procedure: CYSTOSCOPY WITH RETROGRADE PYELOGRAM, DIAGNOSTIC URETEROSCOPY AND STENT CHANGE;  Surgeon: Sebastian Ache, MD;  Location[facility];  Service: Urology;  Laterality: Bilateral;  . HOLMIUM LASER APPLICATION Right 09/07/2013   Procedure: HOLMIUM LASER APPLICATION;  Surgeon: Sebastian Ache, MD;  Location: [facility];  Service: Urology;  Laterality: Right;  . HOLMIUM LASER APPLICATION Right 09/09/2013   Procedure: HOLMIUM LASER APPLICATION;  Surgeon: Sebastian Ache, MD;  [facility];  Service: Urology;  Laterality: Right;  . NEPHROLITHOTOMY  Right 09/07/2013   Procedure: FIRST STAGE RIGHT PERCUTANEOUS NEPHROLITHOTOMY  WITH ACCESS, ;  Surgeon: Sebastian Ache, MD;  Location[facility];  Service: Urology;  Laterality: Right;  . NEPHROLITHOTOMY Right 09/09/2013   Procedure: RIGHT 2ND STAGE NEPHROLITHOTOMY PERCUTANEOUS;  Surgeon: Sebastian Ache, MD;  Location: [facility];  Service: Urology;  Laterality: Right;  . UMBILICAL HERNIA REPAIR     Past Medical History:  Diagnosis Date  . Carotid artery occlusion   . GERD (gastroesophageal reflux disease)   . Hypertension   . Nephrolithiasis   . Stroke (HCC)   . Tobacco abuse    BP (!) 148/88   Pulse 68   Ht 5\' 5"  (1.651 m)   Wt 152 lb 1.6 oz (69 kg)   SpO2 98%   BMI 25.31 kg/m   Opioid Risk Score:   Fall Risk Score:  `1  Depression screen PHQ 2/9  No flowsheet data found.   Review of Systems  Constitutional: Negative.   HENT: Negative.   Eyes: Negative.   Respiratory: Negative.   Cardiovascular: Negative.   Gastrointestinal: Negative.   Endocrine: Negative.   Genitourinary: Negative.   Musculoskeletal: Positive for arthralgias, gait problem and myalgias.  Skin: Negative.   Allergic/Immunologic: Negative.   Neurological: Positive for weakness.  Hematological: Negative.   Psychiatric/Behavioral: Negative.   All other systems reviewed and are negative.     Objective:   Physical Exam Constitutional: No distress . Vital signs reviewed. HENT: Normocephalic.  Atraumatic. Eyes: EOMI. No discharge. Cardiovascular: RRR. No JVD. Respiratory: CTA bilaterally. Normal effort. GI: BS +. Non-distended. Musc: Left shoulder pain with subluxation Neurological: He is alert and oriented. Left facial weakness with mild dysarthria.  Left upper extremity: Shoulder abduction 3-/5, elbow flexion 2+/5, elbow extension 2+/5, 2-/5 wrist extension, hand grip 2/5  Left lower extremity: 4/5 proximal to distal mAS 2/4 finger flexors Skin: Skin is warm and dry.  Psychiatric: Normal mood and  behavior.    Assessment & Plan:  52 year old male with history of HTN, Cocaine/ETOH/tobacco abuse presents for hospital follow up for right MCA infarct.   1. Left inattention and left sided weakness with spasticity affecting mobility as well as ability to carry out ADL tasks secondary to right MCA infarct.  Cont therapies  Cont follow up with Neurology  Cont WHO  Will increase Baclofen 10 TID  Discussed gradual return to driving - unable to tolerate  NMES for shoulder subluxation, reminded again, Kineseotaping with therapies with benefit  Will consider SPRINT in future  Will schedule for Botox injection - discussed benefits/side effects   Left FCU: 20 units  Left FCR: 20 units   Left FDS: 30 units   Left FDP: 30 units     Total units: 100  2. HTN  Elevated today  Cont meds  Encouraged follow up with PCP  2. Tobacco/polysubstance abuse:   Continues to smoke/Etoh, discussed with patient again  3. Right ICA stenosis:   Follow up with Vascular, ultrasound to be performed

## 2017-10-24 NOTE — Addendum Note (Signed)
Addended by: Maryla MorrowPATEL, Dontell Mian A on: 10/24/2017 12:16 PM   Modules accepted: Orders

## 2017-11-01 ENCOUNTER — Ambulatory Visit (HOSPITAL_COMMUNITY)
Admission: RE | Admit: 2017-11-01 | Discharge: 2017-11-01 | Disposition: A | Discharge status: Home / Self Care | Payer: Managed Care, Other (non HMO) | Source: Ambulatory Visit | Attending: Vascular Surgery | Admitting: Vascular Surgery

## 2017-11-01 ENCOUNTER — Ambulatory Visit (INDEPENDENT_AMBULATORY_CARE_PROVIDER_SITE_OTHER): Payer: Managed Care, Other (non HMO) | Admitting: Vascular Surgery

## 2017-11-01 ENCOUNTER — Other Ambulatory Visit: Payer: Self-pay | Admitting: *Deleted

## 2017-11-01 ENCOUNTER — Other Ambulatory Visit: Payer: Self-pay

## 2017-11-01 ENCOUNTER — Encounter: Payer: Self-pay | Admitting: *Deleted

## 2017-11-01 ENCOUNTER — Encounter: Payer: Self-pay | Admitting: Vascular Surgery

## 2017-11-01 VITALS — BP 150/98 | HR 67 | Temp 98.3°F | Resp 16 | Ht 65.0 in | Wt 151.0 lb

## 2017-11-01 DIAGNOSIS — I63511 Cerebral infarction due to unspecified occlusion or stenosis of right middle cerebral artery: Secondary | ICD-10-CM | POA: Diagnosis not present

## 2017-11-01 DIAGNOSIS — I6521 Occlusion and stenosis of right carotid artery: Secondary | ICD-10-CM

## 2017-11-01 DIAGNOSIS — I6523 Occlusion and stenosis of bilateral carotid arteries: Secondary | ICD-10-CM | POA: Diagnosis not present

## 2017-11-01 NOTE — Progress Notes (Signed)
Patient ID: NICHOLA WARREN, male   DOB: [birth year], 52 y.o.   MRN: [id number]  Reason for Consult: Follow-up (1 mth f/u with Carotid. )   Referred by Alysia Penna, MD  Subjective:     HPI:  KASTON FAUGHN is a 52 y.o. male evaluated in the hospital by Dr. Imogene Burn after quite a large right brain stroke.  This time he was noted to have a subtotally occluded right ICA with minimal stenosis in the left ICA.  He has been discharged on aspirin and statin drug.  The time of his stroke he had done cocaine.  Since that time he is quit cocaine.  His only residual deficits are some mild weakness of his left leg and inability to use his left upper extremity which is kept him from returning to work.  He also complains of erectile dysfunction since the stroke.  He has not had any recurrent strokes.  He presents today with carotid duplex for further evaluation.  Past Medical History:  Diagnosis Date  . Carotid artery occlusion   . GERD (gastroesophageal reflux disease)   . Hypertension   . Nephrolithiasis   . Stroke (HCC)   . Tobacco abuse    Family History  Problem Relation Age of Onset  . Prostate cancer Father 59  . Lung cancer Father   . Nephrolithiasis Father   . Ovarian cancer Mother   . CVA Paternal Grandmother        in her 63's  . Cancer Paternal Grandmother        eye  . Stroke Paternal Grandmother   . Diabetes Maternal Grandmother   . Alcoholism Maternal Uncle        x 3   Past Surgical History:  Procedure Laterality Date  . CYSTOSCOPY W/ URETERAL STENT PLACEMENT Bilateral 09/07/2013   Procedure: CYSTOSCOPY WITH RETROGRADE PYELOGRAM/URETERAL STENT PLACEMENT;  Surgeon: Sebastian Ache, MD;  [facility];  Service: Urology;  Laterality: Bilateral;  . CYSTOSCOPY WITH RETROGRADE PYELOGRAM, URETEROSCOPY AND STENT PLACEMENT Bilateral 09/09/2013   Procedure: CYSTOSCOPY WITH RETROGRADE PYELOGRAM, DIAGNOSTIC URETEROSCOPY AND STENT CHANGE;  Surgeon: Sebastian Ache, MD;  [facility];   Service: Urology;  Laterality: Bilateral;  . HOLMIUM LASER APPLICATION Right 09/07/2013   Procedure: HOLMIUM LASER APPLICATION;  Surgeon: Sebastian Ache, MD;  [facility];  Service: Urology;  Laterality: Right;  . HOLMIUM LASER APPLICATION Right 09/09/2013   Procedure: HOLMIUM LASER APPLICATION;  Surgeon: Sebastian Ache, MD;  [facility];  Service: Urology;  Laterality: Right;  . NEPHROLITHOTOMY Right 09/07/2013   Procedure: FIRST STAGE RIGHT PERCUTANEOUS NEPHROLITHOTOMY  WITH ACCESS, ;  Surgeon: Sebastian Ache, MD;  Location[facility];  Service: Urology;  Laterality: Right;  . NEPHROLITHOTOMY Right 09/09/2013   Procedure: RIGHT 2ND STAGE NEPHROLITHOTOMY PERCUTANEOUS;  Surgeon: Sebastian Ache, MD;  [facility];  Service: Urology;  Laterality: Right;  . UMBILICAL HERNIA REPAIR      Short Social History:  Social History   Tobacco Use  . Smoking status: Current Every Day Smoker    Packs/day: 0.50    Years: 25.00    Pack years: 12.50    Types: Cigarettes  . Smokeless tobacco: Never Used  Substance Use Topics  . Alcohol use: Yes    No Known Allergies  Current Outpatient Medications  Medication Sig Dispense Refill  . aspirin EC 325 MG EC tablet Take 1 tablet (325 mg total) by mouth daily. 30 tablet 0  . atorvastatin (LIPITOR) 40 MG tablet Take  1 tablet (40 mg total) by mouth daily at 6 PM. 30 tablet 0  . baclofen (LIORESAL) 10 MG tablet Take 1 tablet (10 mg total) by mouth 3 (three) times daily. 90 each 1  . famotidine (PEPCID) 20 MG tablet TAKE 1 TABLET BY MOUTH DAILY AS NEEDED FOR STOMACH  1  . FLUoxetine (PROZAC) 10 MG capsule Take 1 capsule (10 mg total) by mouth daily at 8 pm. 30 capsule 0  . folic acid (FOLVITE) 1 MG tablet Take 1 tablet (1 mg total) by mouth daily. 30 tablet 0  . hydrochlorothiazide (HYDRODIURIL) 12.5 MG tablet     . hydrochlorothiazide (MICROZIDE) 12.5 MG capsule Take 1 capsule (12.5 mg total) by mouth daily. 30 capsule 0  . lisinopril (PRINIVIL,ZESTRIL)  5 MG tablet TAKE 1 TABLET BY MOUTH FOR BLOOD PRESSURE  2  . Melatonin 3 MG TABS Take 1 tablet (3 mg total) by mouth daily at 8 pm. 30 tablet 0  . NICOTINE STEP 3 7 MG/24HR patch APPLY 1 PATCH ONTO THE SKIN ONCE DAILY AFTER SUPPER  0  . omeprazole (PRILOSEC) 40 MG capsule Take 40 mg by mouth daily.    Marland Kitchen senna-docusate (SENOKOT-S) 8.6-50 MG tablet Take 2 tablets by mouth daily at 8 pm. 60 tablet 0  . traZODone (DESYREL) 50 MG tablet Take 0.5-1 tablets (25-50 mg total) by mouth daily at 8 pm. 30 tablet 0   No current facility-administered medications for this visit.     Review of Systems  Constitutional:  Constitutional negative. HENT: HENT negative.  Eyes: Eyes negative.  Respiratory: Respiratory negative.  GI: Gastrointestinal negative.  Musculoskeletal: Musculoskeletal negative.  Skin: Skin negative.  Neurological: Positive for focal weakness and numbness.  Hematologic: Hematologic/lymphatic negative.  Psychiatric: Psychiatric negative.        Objective:  Objective   Vitals:   11/01/17 1028 11/01/17 1030  BP: (!) 144/101 (!) 150/98  Pulse: 67   Resp: 16   Temp: 98.3 F (36.8 C)   SpO2: 100%   Weight: 151 lb (68.5 kg)   Height: 5\' 5"  (1.651 m)    Body mass index is 25.13 kg/m.  Physical Exam  Constitutional: He is oriented to person, place, and time. He appears well-developed.  HENT:  Head: Normocephalic.  Eyes: Pupils are equal, round, and reactive to light.  Neck: Normal range of motion.  Cardiovascular: Normal rate.  Pulses:      Radial pulses are 2+ on the right side, and 2+ on the left side.  Pulmonary/Chest: Effort normal.  Abdominal: Soft.  Neurological: He is alert and oriented to person, place, and time. A sensory deficit is present. No cranial nerve deficit. Coordination abnormal.  Left upper extremity is contracted as his his left hand  Skin: Skin is dry.  Psychiatric: He has a normal mood and affect. His behavior is normal. Judgment and thought  content normal.    Data: I have independently interpreted his carotid duplex which demonstrates 60 to 79% highly calcified stenosis on the right on the left he is 40 to 59% heterogeneous and calcific.     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  This time he is recovered well from the stroke and I think he is indicated for carotid endarterectomy to prevent further strokes.  I discussed with him that this will not improve his current symptoms or deficits in fact may make them worse in the short-term.  He  does have a risk of further stroke from the surgery itself.  He can continue aspirin through surgery.  I have quoted him approximately 5% risk of stroke given that this was symptomatic as well as 5% risk of cranial nerve injury and some risk to his heart although at the age of 52 I do not think he needs cardiac clearance particularly with echo in May of this year with preserved ejection fraction.  We will get him set up for right carotid endarterectomy in the near future.     Maeola HarmanBrandon Christopher Eligha Kmetz MD [facility]

## 2017-11-11 NOTE — Pre-Procedure Instructions (Signed)
[name] [name]  11/11/2017      [id number] [name], [place] - [name] [address] Phone: [phone] Fax: [phone]    Your procedure is scheduled on Tuesday, Sept 17th   Report to [facility] Admitting at  8:00 AM             (posted surgery time 10:01a - 12:02p)   Call this number if you have problems the morning of surgery:  [phone]   Remember:   Do not eat any foods or drink any liquids after midnight, Monday.              4-5 days prior to surgery, STOP TAKING any Vitamins, Herbal Supplements, Anti-inflammatories, Blood Thinners (unless directly differently by Dr. Franco Nones' office)    Take these medicines the morning of surgery with A SIP OF WATER :Amlodipine, Prozac, Omeprazole    Do not wear jewelry - no rings or watches.  Do not wear lotions, colognes or deodorant.             Men may shave face and neck.  Do not bring valuables to the hospital.  [facility] is not responsible for any belongings or valuables.  Contacts, dentures or bridgework may not be worn into surgery.  Leave your suitcase in the car.  After surgery it may be brought to your room.  For patients admitted to the hospital, discharge time will be determined by your treatment team.  Please read over the following fact sheets that you were given. Pain Booklet and Surgical Site Infection Prevention       [facility]- Preparing For Surgery  Before surgery, you can play an important role. Because skin is not sterile, your skin needs to be as free of germs as possible. You can reduce the number of germs on your skin by washing with CHG (chlorahexidine gluconate) Soap before surgery.  CHG is an antiseptic cleaner which kills germs and bonds with the skin to continue killing germs even after washing.    Oral Hygiene is also important to reduce your risk of infection.    Remember - BRUSH YOUR TEETH THE MORNING OF SURGERY WITH YOUR REGULAR  TOOTHPASTE  Please do not use if you have an allergy to CHG or antibacterial soaps. If your skin becomes reddened/irritated stop using the CHG.  Do not shave (including legs and underarms) for at least 48 hours prior to first CHG shower. It is OK to shave your face.  Please follow these instructions carefully.   1. Shower the NIGHT BEFORE SURGERY and the MORNING OF SURGERY with CHG.   2. If you chose to wash your hair, wash your hair first as usual with your normal shampoo.  3. After you shampoo, rinse your hair and body thoroughly to remove the shampoo.  4. Use CHG as you would any other liquid soap. You can apply CHG directly to the skin and wash gently with a scrungie or a clean washcloth.   5. Apply the CHG Soap to your body ONLY FROM THE NECK DOWN.  Do not use on open wounds or open sores. Avoid contact with your eyes, ears, mouth and genitals (private parts). Wash Face and genitals (private parts)  with your normal soap.  6. Wash thoroughly, paying special attention to the area where your surgery will be performed.  7. Thoroughly rinse your body with warm water from the neck down.  8. DO NOT shower/wash with  your normal soap after using and rinsing off the CHG Soap.  9. Pat yourself dry with a CLEAN TOWEL.  10. Wear CLEAN PAJAMAS to bed the night before surgery, wear comfortable clothes the morning of surgery  11. Place CLEAN SHEETS on your bed the night of your first shower and DO NOT SLEEP WITH PETS.  Day of Surgery:  Do not apply any deodorants/lotions.  Please wear clean clothes to the hospital/surgery center.   Remember to brush your teeth WITH YOUR REGULAR TOOTHPASTE.

## 2017-11-12 ENCOUNTER — Encounter (HOSPITAL_COMMUNITY): Payer: Self-pay

## 2017-11-12 ENCOUNTER — Other Ambulatory Visit: Payer: Self-pay

## 2017-11-12 ENCOUNTER — Encounter (HOSPITAL_COMMUNITY)
Admission: RE | Admit: 2017-11-12 | Discharge: 2017-11-12 | Disposition: A | Discharge status: Home / Self Care | Payer: Managed Care, Other (non HMO) | Source: Ambulatory Visit | Attending: Vascular Surgery | Admitting: Vascular Surgery

## 2017-11-12 DIAGNOSIS — Z01812 Encounter for preprocedural laboratory examination: Secondary | ICD-10-CM | POA: Diagnosis present

## 2017-11-12 HISTORY — DX: Personal history of urinary calculi: Z87.442

## 2017-11-12 LAB — COMPREHENSIVE METABOLIC PANEL
ALBUMIN: 4 g/dL (ref 3.5–5.0)
ALT: 37 U/L (ref 0–44)
AST: 33 U/L (ref 15–41)
Alkaline Phosphatase: 94 U/L (ref 38–126)
Anion gap: 12 (ref 5–15)
BUN: 9 mg/dL (ref 6–20)
CHLORIDE: 105 mmol/L (ref 98–111)
CO2: 22 mmol/L (ref 22–32)
CREATININE: 0.94 mg/dL (ref 0.61–1.24)
Calcium: 9.2 mg/dL (ref 8.9–10.3)
GFR calc Af Amer: >60 mL/min (ref >60)
GLUCOSE: 91 mg/dL (ref 70–99)
Potassium: 3.8 mmol/L (ref 3.5–5.1)
Sodium: 139 mmol/L (ref 135–145)
Total Bilirubin: 0.7 mg/dL (ref 0.3–1.2)
Total Protein: 6.8 g/dL (ref 6.5–8.1)

## 2017-11-12 LAB — URINALYSIS, ROUTINE W REFLEX MICROSCOPIC
BILIRUBIN URINE: NEGATIVE
Glucose, UA: NEGATIVE mg/dL
Hgb urine dipstick: NEGATIVE
KETONES UR: NEGATIVE mg/dL
Leukocytes, UA: NEGATIVE
NITRITE: NEGATIVE
PH: 7 (ref 5.0–8.0)
PROTEIN: NEGATIVE mg/dL
Specific Gravity, Urine: 1.02 (ref 1.005–1.030)

## 2017-11-12 LAB — APTT: aPTT: 28 seconds (ref 24–36)

## 2017-11-12 LAB — CBC
HEMATOCRIT: 46.6 % (ref 39.0–52.0)
Hemoglobin: 16 g/dL (ref 13.0–17.0)
MCH: 33.6 pg (ref 26.0–34.0)
MCHC: 34.3 g/dL (ref 30.0–36.0)
MCV: 97.9 fL (ref 78.0–100.0)
Platelets: 277 10*3/uL (ref 150–400)
RBC: 4.76 MIL/uL (ref 4.22–5.81)
RDW: 12.3 % (ref 11.5–15.5)
WBC: 11.4 10*3/uL — AB (ref 4.0–10.5)

## 2017-11-12 LAB — SURGICAL PCR SCREEN
MRSA, PCR: NEGATIVE
Staphylococcus aureus: NEGATIVE

## 2017-11-12 LAB — ABO/RH: ABO/RH(D): O POS

## 2017-11-12 LAB — TYPE AND SCREEN
ABO/RH(D): O POS
ANTIBODY SCREEN: NEGATIVE

## 2017-11-12 LAB — BLOOD GAS, ARTERIAL
Acid-Base Excess: 3.1 mmol/L — ABNORMAL HIGH (ref 0.0–2.0)
Bicarbonate: 26.7 mmol/L (ref 20.0–28.0)
DRAWN BY: 421801
FIO2: 21
O2 Saturation: 95.8 %
PCO2 ART: 38.1 mmHg (ref 32.0–48.0)
PH ART: 7.46 — AB (ref 7.350–7.450)
Patient temperature: 98.6
pO2, Arterial: 73.9 mmHg — ABNORMAL LOW (ref 83.0–108.0)

## 2017-11-12 LAB — PROTIME-INR
INR: 1.11
PROTHROMBIN TIME: 14.2 s (ref 11.4–15.2)

## 2017-11-12 MED ORDER — BACLOFEN 10 MG PO TABS: 10.0000 mg | ORAL_TABLET | Freq: Three times a day (TID) | ORAL | 1 refills | Status: DC

## 2017-11-12 NOTE — Telephone Encounter (Signed)
Pt sister called requesting a refill on Baclofen and states pt is in pain and requesting something to be sent in for pain. Baclofen prescription has been sent in. Advice on something for pain.

## 2017-11-12 NOTE — Pre-Procedure Instructions (Addendum)
[name] [name]  11/12/2017      [id number] [name], [place] - [name] [place] [address] Phone: [phone] Fax: [phone]    Your procedure is scheduled on Tuesday, Sept 17th   [facility] at  8:00 AM             (posted surgery time 10:01a - 12:02p)   Call this number if you have problems the morning of surgery:  [phone]   Remember:   Do not eat any foods or drink any liquids after midnight, Monday.              4-5 days prior to surgery, STOP TAKING any Vitamins, Herbal Supplements, Anti-inflammatories, Blood Thinners (unless directly differently by Dr. Franco Nones' office)    Take these medicines the morning of surgery with A SIP OF WATER :Amlodipine, Prozac, Omeprazole,Aspirin    Do not wear jewelry - no rings or watches.  Do not wear lotions, colognes or deodorant.             Men may shave face and neck.  Do not bring valuables to the hospital.  [facility] is not responsible for any belongings or valuables.  Contacts, dentures or bridgework may not be worn into surgery.  Leave your suitcase in the car.  After surgery it may be brought to your room.  For patients admitted to the hospital, discharge time will be determined by your treatment team.  Please read over the following fact sheets that you were given. Pain Booklet and Surgical Site Infection Prevention       - Preparing For Surgery  Before surgery, you can play an important role. Because skin is not sterile, your skin needs to be as free of germs as possible. You can reduce the number of germs on your skin by washing with CHG (chlorahexidine gluconate) Soap before surgery.  CHG is an antiseptic cleaner which kills germs and bonds with the skin to continue killing germs even after washing.    Oral Hygiene is also important to reduce your risk of infection.    Remember - BRUSH YOUR TEETH THE MORNING OF SURGERY WITH  YOUR REGULAR TOOTHPASTE  Please do not use if you have an allergy to CHG or antibacterial soaps. If your skin becomes reddened/irritated stop using the CHG.  Do not shave (including legs and underarms) for at least 48 hours prior to first CHG shower. It is OK to shave your face.  Please follow these instructions carefully.   1. Shower the NIGHT BEFORE SURGERY and the MORNING OF SURGERY with CHG.   2. If you chose to wash your hair, wash your hair first as usual with your normal shampoo.  3. After you shampoo, rinse your hair and body thoroughly to remove the shampoo.  4. Use CHG as you would any other liquid soap. You can apply CHG directly to the skin and wash gently with a scrungie or a clean washcloth.   5. Apply the CHG Soap to your body ONLY FROM THE NECK DOWN.  Do not use on open wounds or open sores. Avoid contact with your eyes, ears, mouth and genitals (private parts). Wash Face and genitals (private parts)  with your normal soap.  6. Wash thoroughly, paying special attention to the area where your surgery will be performed.  7. Thoroughly rinse your body with warm water from the neck down.  8. DO NOT shower/wash with  your normal soap after using and rinsing off the CHG Soap.  9. Pat yourself dry with a CLEAN TOWEL.  10. Wear CLEAN PAJAMAS to bed the night before surgery, wear comfortable clothes the morning of surgery  11. Place CLEAN SHEETS on your bed the night of your first shower and DO NOT SLEEP WITH PETS.  Day of Surgery:  Do not apply any deodorants/lotions.  Please wear clean clothes to the hospital/surgery center.   Remember to brush your teeth WITH YOUR REGULAR TOOTHPASTE.

## 2017-11-12 NOTE — Progress Notes (Signed)
PCP  Alysia Penna MD  Denies any cardiac history or cardiac testing.  ECHO done 07-2017

## 2017-11-12 NOTE — Telephone Encounter (Signed)
Where is the pain?  He can come in for injection if appropriate.

## 2017-11-13 NOTE — Telephone Encounter (Signed)
Contacted patient, left message regarding Dr. Eliane Decree response.  Encouraged patient to call and make an appointment

## 2017-11-15 ENCOUNTER — Encounter: Payer: Self-pay | Admitting: Physical Medicine & Rehabilitation

## 2017-11-15 ENCOUNTER — Other Ambulatory Visit: Payer: Self-pay

## 2017-11-15 ENCOUNTER — Encounter
Payer: Managed Care, Other (non HMO) | Attending: Physical Medicine & Rehabilitation | Admitting: Physical Medicine & Rehabilitation

## 2017-11-15 VITALS — BP 120/86 | HR 78 | Ht 65.0 in | Wt 147.6 lb

## 2017-11-15 DIAGNOSIS — Z811 Family history of alcohol abuse and dependence: Secondary | ICD-10-CM | POA: Insufficient documentation

## 2017-11-15 DIAGNOSIS — Z8041 Family history of malignant neoplasm of ovary: Secondary | ICD-10-CM | POA: Diagnosis not present

## 2017-11-15 DIAGNOSIS — K219 Gastro-esophageal reflux disease without esophagitis: Secondary | ICD-10-CM | POA: Insufficient documentation

## 2017-11-15 DIAGNOSIS — F17210 Nicotine dependence, cigarettes, uncomplicated: Secondary | ICD-10-CM | POA: Insufficient documentation

## 2017-11-15 DIAGNOSIS — Z808 Family history of malignant neoplasm of other organs or systems: Secondary | ICD-10-CM | POA: Diagnosis not present

## 2017-11-15 DIAGNOSIS — Z823 Family history of stroke: Secondary | ICD-10-CM | POA: Diagnosis not present

## 2017-11-15 DIAGNOSIS — Z8042 Family history of malignant neoplasm of prostate: Secondary | ICD-10-CM | POA: Insufficient documentation

## 2017-11-15 DIAGNOSIS — Z801 Family history of malignant neoplasm of trachea, bronchus and lung: Secondary | ICD-10-CM | POA: Insufficient documentation

## 2017-11-15 DIAGNOSIS — G8114 Spastic hemiplegia affecting left nondominant side: Secondary | ICD-10-CM

## 2017-11-15 DIAGNOSIS — Z833 Family history of diabetes mellitus: Secondary | ICD-10-CM | POA: Diagnosis not present

## 2017-11-15 DIAGNOSIS — I63511 Cerebral infarction due to unspecified occlusion or stenosis of right middle cerebral artery: Secondary | ICD-10-CM | POA: Diagnosis present

## 2017-11-15 DIAGNOSIS — Z9889 Other specified postprocedural states: Secondary | ICD-10-CM | POA: Insufficient documentation

## 2017-11-15 DIAGNOSIS — I6521 Occlusion and stenosis of right carotid artery: Secondary | ICD-10-CM | POA: Diagnosis not present

## 2017-11-15 DIAGNOSIS — I10 Essential (primary) hypertension: Secondary | ICD-10-CM | POA: Diagnosis not present

## 2017-11-15 NOTE — Progress Notes (Signed)
Botox: Procedure Note Patient Name: [name] [name] DOB: 01/03/1966 MRN: [id number]  Date: 11/15/17  Procedure: Botulinum toxin administration Guidance: EMG Diagnosis: Left spastic hemiparesis Attending: Maryla MorrowAnkit Patel, MD   Informed consent: Risks, benefits & options of the procedure are explained to the patient (and/or family). The patient elects to proceed with procedure. Risks include but are not limited to weakness, respiratory distress, dry mouth, ptosis, antibody formation, worsening of some areas of function. Benefits include decreased abnormal muscle tone, improved hygiene and positioning, decreased skin breakdown and, in some cases, decreased pain. Options include conservative management with oral antispasticity agents, phenol chemodenervation of nerve or at motor nerve branches. More invasive options include intrathecal balcofen adminstration for appropriate candidates. Surgical options may include tendon lengthening or transposition or, rarely, dorsal rhizotomy.   History/Physical Examination: 52 y.o. male with history of HTN, Cocaine/ETOH/tobacco abuse presents for hospital follow up for right MCA infarct.   mAS  Left elbow flexors 1/4   Left wrist flexors 1/4   Left finger flexors 1/4     Previous Treatments: Oral therapy, Therapy/Range of motion Indication for guidance: Target active muscules  Procedure: Botulinum toxin was mixed with preservative free saline with a dilution of 1cc to 100 units. Targeted limb and muscles were identified. The skin was prepped with alcohol swabs and placement of needle tip in targeted muscle was confirmed using appropriate guidance. Prior to injection, positioning of needle tip outside of blood vessel was determined by pulling back on syringe plunger.  MUSCLE UNITS  Left biceps: 20 units Left FCU: 20 units Left FCR: 20 units Left FDS: 20 units Left FDP: 20 units  Total units used: 100  Complications: None  Plan: RTC 6 weeks  Ankit Anil  Patel 9:43 AM

## 2017-11-19 ENCOUNTER — Other Ambulatory Visit: Payer: Self-pay | Admitting: Physical Medicine & Rehabilitation

## 2017-11-19 ENCOUNTER — Encounter (HOSPITAL_COMMUNITY): Payer: Self-pay | Admitting: *Deleted

## 2017-11-19 ENCOUNTER — Encounter (HOSPITAL_COMMUNITY)
Admission: RE | Disposition: A | Discharge status: Home / Self Care | Payer: Self-pay | Source: Ambulatory Visit | Attending: Vascular Surgery

## 2017-11-19 ENCOUNTER — Inpatient Hospital Stay (HOSPITAL_COMMUNITY): Payer: Managed Care, Other (non HMO)

## 2017-11-19 ENCOUNTER — Inpatient Hospital Stay (HOSPITAL_COMMUNITY)
Admission: RE | Admit: 2017-11-19 | Discharge: 2017-11-20 | DRG: 038 | Disposition: A | Discharge status: Home / Self Care | Payer: Managed Care, Other (non HMO) | Source: Ambulatory Visit | Attending: Vascular Surgery | Admitting: Vascular Surgery

## 2017-11-19 DIAGNOSIS — I10 Essential (primary) hypertension: Secondary | ICD-10-CM | POA: Diagnosis present

## 2017-11-19 DIAGNOSIS — N529 Male erectile dysfunction, unspecified: Secondary | ICD-10-CM | POA: Diagnosis present

## 2017-11-19 DIAGNOSIS — K219 Gastro-esophageal reflux disease without esophagitis: Secondary | ICD-10-CM | POA: Diagnosis present

## 2017-11-19 DIAGNOSIS — Z7982 Long term (current) use of aspirin: Secondary | ICD-10-CM

## 2017-11-19 DIAGNOSIS — R29810 Facial weakness: Secondary | ICD-10-CM | POA: Diagnosis present

## 2017-11-19 DIAGNOSIS — I6521 Occlusion and stenosis of right carotid artery: Principal | ICD-10-CM | POA: Diagnosis present

## 2017-11-19 DIAGNOSIS — Z79899 Other long term (current) drug therapy: Secondary | ICD-10-CM

## 2017-11-19 DIAGNOSIS — Z87442 Personal history of urinary calculi: Secondary | ICD-10-CM | POA: Diagnosis not present

## 2017-11-19 DIAGNOSIS — I6529 Occlusion and stenosis of unspecified carotid artery: Secondary | ICD-10-CM | POA: Diagnosis present

## 2017-11-19 DIAGNOSIS — Z823 Family history of stroke: Secondary | ICD-10-CM | POA: Diagnosis not present

## 2017-11-19 DIAGNOSIS — F17210 Nicotine dependence, cigarettes, uncomplicated: Secondary | ICD-10-CM | POA: Diagnosis present

## 2017-11-19 DIAGNOSIS — I69354 Hemiplegia and hemiparesis following cerebral infarction affecting left non-dominant side: Secondary | ICD-10-CM

## 2017-11-19 HISTORY — PX: ENDARTERECTOMY: SHX5162

## 2017-11-19 HISTORY — PX: PATCH ANGIOPLASTY: SHX6230

## 2017-11-19 LAB — POCT ACTIVATED CLOTTING TIME: ACTIVATED CLOTTING TIME: 263 s

## 2017-11-19 SURGERY — ENDARTERECTOMY, CAROTID
Anesthesia: General | Site: Neck | Laterality: Right

## 2017-11-19 MED ORDER — SODIUM CHLORIDE 0.9 % IV SOLN
500.0000 mL | Freq: Once | INTRAVENOUS | Status: AC | PRN
  Administered 2017-11-19: 500 mL via INTRAVENOUS

## 2017-11-19 MED ORDER — ONDANSETRON HCL 4 MG/2ML IJ SOLN: 4.0000 mg | Freq: Four times a day (QID) | INTRAMUSCULAR | Status: DC | PRN

## 2017-11-19 MED ORDER — NITROGLYCERIN 0.2 MG/ML ON CALL CATH LAB
INTRAVENOUS | Status: DC | PRN
  Administered 2017-11-19 (×4): 40 ug via INTRAVENOUS

## 2017-11-19 MED ORDER — MAGNESIUM SULFATE 2 GM/50ML IV SOLN: 2.0000 g | Freq: Every day | INTRAVENOUS | Status: DC | PRN

## 2017-11-19 MED ORDER — SODIUM CHLORIDE 0.9 % IV SOLN
INTRAVENOUS | Status: DC
  Administered 2017-11-19: 20:00:00 via INTRAVENOUS

## 2017-11-19 MED ORDER — PHENYLEPHRINE HCL 10 MG/ML IJ SOLN
INTRAMUSCULAR | Status: DC | PRN
  Administered 2017-11-19: 60 ug via INTRAVENOUS
  Administered 2017-11-19: 80 ug via INTRAVENOUS
  Administered 2017-11-19 (×2): 40 ug via INTRAVENOUS

## 2017-11-19 MED ORDER — HEMOSTATIC AGENTS (NO CHARGE) OPTIME
TOPICAL | Status: DC | PRN
  Administered 2017-11-19: 1 via TOPICAL

## 2017-11-19 MED ORDER — FOLIC ACID 1 MG PO TABS
1.0000 mg | ORAL_TABLET | Freq: Every day | ORAL | Status: DC
  Administered 2017-11-19: 1 mg via ORAL
  Filled 2017-11-19: qty 1

## 2017-11-19 MED ORDER — SODIUM CHLORIDE 0.9 % IV SOLN
INTRAVENOUS | Status: DC | PRN
  Administered 2017-11-19: 10 ug/min via INTRAVENOUS

## 2017-11-19 MED ORDER — SODIUM CHLORIDE 0.9 % IV SOLN
0.0125 ug/kg/min | INTRAVENOUS | Status: DC
  Filled 2017-11-19: qty 2000

## 2017-11-19 MED ORDER — ROCURONIUM BROMIDE 50 MG/5ML IV SOSY
PREFILLED_SYRINGE | INTRAVENOUS | Status: AC
  Filled 2017-11-19: qty 5

## 2017-11-19 MED ORDER — ASPIRIN EC 325 MG PO TBEC
325.0000 mg | DELAYED_RELEASE_TABLET | Freq: Every day | ORAL | Status: DC
  Administered 2017-11-20: 325 mg via ORAL
  Filled 2017-11-19: qty 1

## 2017-11-19 MED ORDER — ONDANSETRON HCL 4 MG/2ML IJ SOLN
INTRAMUSCULAR | Status: DC | PRN
  Administered 2017-11-19: 4 mg via INTRAVENOUS

## 2017-11-19 MED ORDER — DEXMEDETOMIDINE HCL IN NACL 200 MCG/50ML IV SOLN
INTRAVENOUS | Status: DC | PRN
  Administered 2017-11-19: 8 ug via INTRAVENOUS
  Administered 2017-11-19: 4 ug via INTRAVENOUS
  Administered 2017-11-19: 8 ug via INTRAVENOUS

## 2017-11-19 MED ORDER — ATORVASTATIN CALCIUM 40 MG PO TABS
40.0000 mg | ORAL_TABLET | Freq: Every day | ORAL | Status: DC
  Administered 2017-11-19 – 2017-11-20 (×2): 40 mg via ORAL
  Filled 2017-11-19 (×2): qty 1

## 2017-11-19 MED ORDER — FENTANYL CITRATE (PF) 100 MCG/2ML IJ SOLN
25.0000 ug | INTRAMUSCULAR | Status: DC | PRN
  Administered 2017-11-19: 50 ug via INTRAVENOUS
  Administered 2017-11-19 (×2): 25 ug via INTRAVENOUS

## 2017-11-19 MED ORDER — POTASSIUM CHLORIDE CRYS ER 20 MEQ PO TBCR: 20.0000 meq | EXTENDED_RELEASE_TABLET | Freq: Every day | ORAL | Status: DC | PRN

## 2017-11-19 MED ORDER — ROCURONIUM BROMIDE 50 MG/5ML IV SOSY
PREFILLED_SYRINGE | INTRAVENOUS | Status: DC | PRN
  Administered 2017-11-19: 15 mg via INTRAVENOUS
  Administered 2017-11-19: 50 mg via INTRAVENOUS

## 2017-11-19 MED ORDER — HEPARIN SODIUM (PORCINE) 1000 UNIT/ML IJ SOLN
INTRAMUSCULAR | Status: DC | PRN
  Administered 2017-11-19: 7000 [IU] via INTRAVENOUS

## 2017-11-19 MED ORDER — METOPROLOL TARTRATE 5 MG/5ML IV SOLN: 2.0000 mg | INTRAVENOUS | Status: DC | PRN

## 2017-11-19 MED ORDER — CEFAZOLIN SODIUM-DEXTROSE 2-4 GM/100ML-% IV SOLN
INTRAVENOUS | Status: AC
  Filled 2017-11-19: qty 100

## 2017-11-19 MED ORDER — FLUOXETINE HCL 10 MG PO CAPS
10.0000 mg | ORAL_CAPSULE | Freq: Every day | ORAL | Status: DC
  Administered 2017-11-19 – 2017-11-20 (×2): 10 mg via ORAL
  Filled 2017-11-19 (×2): qty 1

## 2017-11-19 MED ORDER — HYDRALAZINE HCL 20 MG/ML IJ SOLN: 5.0000 mg | INTRAMUSCULAR | Status: DC | PRN

## 2017-11-19 MED ORDER — LACTATED RINGERS IV SOLN
INTRAVENOUS | Status: DC | PRN
  Administered 2017-11-19: 09:00:00 via INTRAVENOUS

## 2017-11-19 MED ORDER — ACETAMINOPHEN 325 MG RE SUPP: 325.0000 mg | RECTAL | Status: DC | PRN

## 2017-11-19 MED ORDER — CHLORHEXIDINE GLUCONATE CLOTH 2 % EX PADS: 6.0000 | MEDICATED_PAD | Freq: Once | CUTANEOUS | Status: DC

## 2017-11-19 MED ORDER — SODIUM CHLORIDE 0.9 % IV SOLN
INTRAVENOUS | Status: DC | PRN
  Administered 2017-11-19: .2 ug/kg/min via INTRAVENOUS

## 2017-11-19 MED ORDER — MIDAZOLAM HCL 2 MG/2ML IJ SOLN
INTRAMUSCULAR | Status: AC
  Filled 2017-11-19: qty 2

## 2017-11-19 MED ORDER — SENNOSIDES-DOCUSATE SODIUM 8.6-50 MG PO TABS
1.0000 | ORAL_TABLET | Freq: Every day | ORAL | Status: DC
  Administered 2017-11-19: 1 via ORAL
  Filled 2017-11-19: qty 1

## 2017-11-19 MED ORDER — ALUM & MAG HYDROXIDE-SIMETH 200-200-20 MG/5ML PO SUSP: 15.0000 mL | ORAL | Status: DC | PRN

## 2017-11-19 MED ORDER — LISINOPRIL 5 MG PO TABS
5.0000 mg | ORAL_TABLET | Freq: Every day | ORAL | Status: DC
  Administered 2017-11-19: 5 mg via ORAL
  Filled 2017-11-19: qty 1

## 2017-11-19 MED ORDER — OXYCODONE-ACETAMINOPHEN 5-325 MG PO TABS
1.0000 | ORAL_TABLET | ORAL | Status: DC | PRN
  Administered 2017-11-19 – 2017-11-20 (×5): 2 via ORAL
  Filled 2017-11-19 (×4): qty 2

## 2017-11-19 MED ORDER — CEFAZOLIN SODIUM-DEXTROSE 2-3 GM-%(50ML) IV SOLR: INTRAVENOUS | Status: DC | PRN

## 2017-11-19 MED ORDER — SODIUM CHLORIDE 0.9 % IV SOLN
INTRAVENOUS | Status: AC
  Filled 2017-11-19: qty 1.2

## 2017-11-19 MED ORDER — SODIUM CHLORIDE 0.9 % IV SOLN
INTRAVENOUS | Status: DC | PRN
  Administered 2017-11-19: 500 mL

## 2017-11-19 MED ORDER — PROPOFOL 10 MG/ML IV BOLUS
INTRAVENOUS | Status: DC | PRN
  Administered 2017-11-19: 150 mg via INTRAVENOUS
  Administered 2017-11-19 (×4): 50 mg via INTRAVENOUS

## 2017-11-19 MED ORDER — ACETAMINOPHEN 325 MG PO TABS: 325.0000 mg | ORAL_TABLET | ORAL | Status: DC | PRN

## 2017-11-19 MED ORDER — CEFAZOLIN SODIUM-DEXTROSE 2-4 GM/100ML-% IV SOLN
2.0000 g | Freq: Three times a day (TID) | INTRAVENOUS | Status: AC
  Administered 2017-11-19 – 2017-11-20 (×2): 2 g via INTRAVENOUS
  Filled 2017-11-19 (×2): qty 100

## 2017-11-19 MED ORDER — GUAIFENESIN-DM 100-10 MG/5ML PO SYRP: 15.0000 mL | ORAL_SOLUTION | ORAL | Status: DC | PRN

## 2017-11-19 MED ORDER — SUGAMMADEX SODIUM 200 MG/2ML IV SOLN
INTRAVENOUS | Status: DC | PRN
  Administered 2017-11-19: 135.2 mg via INTRAVENOUS

## 2017-11-19 MED ORDER — PROTAMINE SULFATE 10 MG/ML IV SOLN
INTRAVENOUS | Status: DC | PRN
  Administered 2017-11-19: 50 mg via INTRAVENOUS

## 2017-11-19 MED ORDER — INFLUENZA VAC SPLIT QUAD 0.5 ML IM SUSY
0.5000 mL | PREFILLED_SYRINGE | INTRAMUSCULAR | Status: DC
  Filled 2017-11-19: qty 0.5

## 2017-11-19 MED ORDER — LIDOCAINE 2% (20 MG/ML) 5 ML SYRINGE
INTRAMUSCULAR | Status: DC | PRN
  Administered 2017-11-19: 100 mg via INTRAVENOUS

## 2017-11-19 MED ORDER — FENTANYL CITRATE (PF) 250 MCG/5ML IJ SOLN
INTRAMUSCULAR | Status: AC
  Filled 2017-11-19: qty 5

## 2017-11-19 MED ORDER — SODIUM CHLORIDE 0.9 % IV SOLN: INTRAVENOUS | Status: DC

## 2017-11-19 MED ORDER — TRAZODONE HCL 50 MG PO TABS
50.0000 mg | ORAL_TABLET | Freq: Every day | ORAL | Status: DC
  Administered 2017-11-19: 50 mg via ORAL
  Filled 2017-11-19: qty 1

## 2017-11-19 MED ORDER — MELATONIN 3 MG PO TABS
9.0000 mg | ORAL_TABLET | Freq: Every day | ORAL | Status: DC
  Administered 2017-11-19: 9 mg via ORAL
  Filled 2017-11-19: qty 3

## 2017-11-19 MED ORDER — DOCUSATE SODIUM 100 MG PO CAPS
100.0000 mg | ORAL_CAPSULE | Freq: Every day | ORAL | Status: DC
  Administered 2017-11-20: 100 mg via ORAL
  Filled 2017-11-19: qty 1

## 2017-11-19 MED ORDER — AMLODIPINE BESYLATE 5 MG PO TABS
5.0000 mg | ORAL_TABLET | Freq: Every day | ORAL | Status: DC
  Administered 2017-11-20: 5 mg via ORAL
  Filled 2017-11-19: qty 1

## 2017-11-19 MED ORDER — PHENYLEPHRINE 40 MCG/ML (10ML) SYRINGE FOR IV PUSH (FOR BLOOD PRESSURE SUPPORT)
PREFILLED_SYRINGE | INTRAVENOUS | Status: AC
  Filled 2017-11-19: qty 10

## 2017-11-19 MED ORDER — PHENOL 1.4 % MT LIQD: 1.0000 | OROMUCOSAL | Status: DC | PRN

## 2017-11-19 MED ORDER — HEPARIN SODIUM (PORCINE) 1000 UNIT/ML IJ SOLN
INTRAMUSCULAR | Status: AC
  Filled 2017-11-19: qty 1

## 2017-11-19 MED ORDER — PANTOPRAZOLE SODIUM 40 MG PO TBEC
40.0000 mg | DELAYED_RELEASE_TABLET | Freq: Every day | ORAL | Status: DC
  Administered 2017-11-19 – 2017-11-20 (×2): 40 mg via ORAL
  Filled 2017-11-19 (×2): qty 1

## 2017-11-19 MED ORDER — LACTATED RINGERS IV SOLN
INTRAVENOUS | Status: DC
  Administered 2017-11-19: 09:00:00 via INTRAVENOUS

## 2017-11-19 MED ORDER — LABETALOL HCL 5 MG/ML IV SOLN: 10.0000 mg | INTRAVENOUS | Status: DC | PRN

## 2017-11-19 MED ORDER — FAMOTIDINE 20 MG PO TABS
20.0000 mg | ORAL_TABLET | Freq: Every day | ORAL | Status: DC
  Administered 2017-11-19: 20 mg via ORAL
  Filled 2017-11-19: qty 1

## 2017-11-19 MED ORDER — 0.9 % SODIUM CHLORIDE (POUR BTL) OPTIME
TOPICAL | Status: DC | PRN
  Administered 2017-11-19 (×3): 1000 mL

## 2017-11-19 MED ORDER — FENTANYL CITRATE (PF) 100 MCG/2ML IJ SOLN
INTRAMUSCULAR | Status: AC
  Administered 2017-11-19: 50 ug via INTRAVENOUS
  Filled 2017-11-19: qty 2

## 2017-11-19 MED ORDER — DEXAMETHASONE SODIUM PHOSPHATE 10 MG/ML IJ SOLN
INTRAMUSCULAR | Status: AC
  Filled 2017-11-19: qty 1

## 2017-11-19 MED ORDER — OXYCODONE-ACETAMINOPHEN 5-325 MG PO TABS
ORAL_TABLET | ORAL | Status: AC
  Administered 2017-11-19: 2 via ORAL
  Filled 2017-11-19: qty 2

## 2017-11-19 MED ORDER — PROPOFOL 10 MG/ML IV BOLUS
INTRAVENOUS | Status: AC
  Filled 2017-11-19: qty 20

## 2017-11-19 MED ORDER — LIDOCAINE 2% (20 MG/ML) 5 ML SYRINGE
INTRAMUSCULAR | Status: AC
  Filled 2017-11-19: qty 5

## 2017-11-19 MED ORDER — GLYCOPYRROLATE 0.2 MG/ML IJ SOLN
INTRAMUSCULAR | Status: DC | PRN
  Administered 2017-11-19: 0.2 mg via INTRAVENOUS

## 2017-11-19 MED ORDER — CEFAZOLIN SODIUM-DEXTROSE 2-4 GM/100ML-% IV SOLN
2.0000 g | INTRAVENOUS | Status: AC
  Administered 2017-11-19: 2 g via INTRAVENOUS

## 2017-11-19 MED ORDER — MIDAZOLAM HCL 5 MG/5ML IJ SOLN
INTRAMUSCULAR | Status: DC | PRN
  Administered 2017-11-19: 2 mg via INTRAVENOUS

## 2017-11-19 MED ORDER — MORPHINE SULFATE (PF) 2 MG/ML IV SOLN
2.0000 mg | INTRAVENOUS | Status: DC | PRN
  Administered 2017-11-19 – 2017-11-20 (×4): 2 mg via INTRAVENOUS
  Filled 2017-11-19 (×4): qty 1

## 2017-11-19 MED ORDER — DEXAMETHASONE SODIUM PHOSPHATE 10 MG/ML IJ SOLN
INTRAMUSCULAR | Status: DC | PRN
  Administered 2017-11-19: 10 mg via INTRAVENOUS

## 2017-11-19 MED ORDER — HYDROCHLOROTHIAZIDE 12.5 MG PO CAPS
12.5000 mg | ORAL_CAPSULE | ORAL | Status: DC
  Administered 2017-11-20: 12.5 mg via ORAL
  Filled 2017-11-19: qty 1

## 2017-11-19 SURGICAL SUPPLY — 51 items
ADH SKN CLS APL DERMABOND .7 (GAUZE/BANDAGES/DRESSINGS) ×1
ADPR TBG 2 MALE LL ART (MISCELLANEOUS)
CANISTER SUCT 3000ML PPV (MISCELLANEOUS) ×3 IMPLANT
CATH ROBINSON RED A/P 18FR (CATHETERS) ×3 IMPLANT
CLIP VESOCCLUDE MED 24/CT (CLIP) ×3 IMPLANT
CLIP VESOCCLUDE SM WIDE 24/CT (CLIP) ×3 IMPLANT
COVER PROBE W GEL 5X96 (DRAPES) IMPLANT
CRADLE DONUT ADULT HEAD (MISCELLANEOUS) ×3 IMPLANT
DERMABOND ADVANCED (GAUZE/BANDAGES/DRESSINGS) ×2
DERMABOND ADVANCED .7 DNX12 (GAUZE/BANDAGES/DRESSINGS) ×1 IMPLANT
DRAIN CHANNEL 15F RND FF W/TCR (WOUND CARE) IMPLANT
ELECT REM PT RETURN 9FT ADLT (ELECTROSURGICAL) ×3
ELECTRODE REM PT RTRN 9FT ADLT (ELECTROSURGICAL) ×1 IMPLANT
EVACUATOR SILICONE 100CC (DRAIN) IMPLANT
GLOVE BIO SURGEON STRL SZ7.5 (GLOVE) ×3 IMPLANT
GOWN STRL REUS W/ TWL LRG LVL3 (GOWN DISPOSABLE) ×2 IMPLANT
GOWN STRL REUS W/ TWL XL LVL3 (GOWN DISPOSABLE) ×1 IMPLANT
GOWN STRL REUS W/TWL LRG LVL3 (GOWN DISPOSABLE) ×6
GOWN STRL REUS W/TWL XL LVL3 (GOWN DISPOSABLE) ×3
HEMOSTAT SNOW SURGICEL 2X4 (HEMOSTASIS) ×2 IMPLANT
INSERT FOGARTY SM (MISCELLANEOUS) ×3 IMPLANT
IV ADAPTER SYR DOUBLE MALE LL (MISCELLANEOUS) IMPLANT
KIT BASIN OR (CUSTOM PROCEDURE TRAY) ×3 IMPLANT
KIT SHUNT ARGYLE CAROTID ART 6 (VASCULAR PRODUCTS) IMPLANT
KIT TURNOVER KIT B (KITS) ×3 IMPLANT
NDL HYPO 25GX1X1/2 BEV (NEEDLE) IMPLANT
NDL SPNL 20GX3.5 QUINCKE YW (NEEDLE) IMPLANT
NEEDLE HYPO 25GX1X1/2 BEV (NEEDLE) IMPLANT
NEEDLE SPNL 20GX3.5 QUINCKE YW (NEEDLE) IMPLANT
NS IRRIG 1000ML POUR BTL (IV SOLUTION) ×9 IMPLANT
PACK CAROTID (CUSTOM PROCEDURE TRAY) ×3 IMPLANT
PAD ARMBOARD 7.5X6 YLW CONV (MISCELLANEOUS) ×6 IMPLANT
PATCH VASC XENOSURE 1CMX6CM (Vascular Products) ×3 IMPLANT
PATCH VASC XENOSURE 1X6 (Vascular Products) IMPLANT
SHUNT CAROTID BYPASS 10 (VASCULAR PRODUCTS) IMPLANT
SHUNT CAROTID BYPASS 12FRX15.5 (VASCULAR PRODUCTS) IMPLANT
STOPCOCK 4 WAY LG BORE MALE ST (IV SETS) IMPLANT
SUT ETHILON 3 0 PS 1 (SUTURE) IMPLANT
SUT MNCRL AB 4-0 PS2 18 (SUTURE) ×3 IMPLANT
SUT PROLENE 6 0 BV (SUTURE) ×5 IMPLANT
SUT PROLENE 7 0 BV1 MDA (SUTURE) ×2 IMPLANT
SUT SILK 3 0 (SUTURE)
SUT SILK 3-0 18XBRD TIE 12 (SUTURE) IMPLANT
SUT VIC AB 2-0 CT1 27 (SUTURE) ×3
SUT VIC AB 2-0 CT1 TAPERPNT 27 (SUTURE) ×1 IMPLANT
SUT VIC AB 3-0 SH 27 (SUTURE) ×3
SUT VIC AB 3-0 SH 27X BRD (SUTURE) ×1 IMPLANT
SYR CONTROL 10ML LL (SYRINGE) IMPLANT
TOWEL GREEN STERILE (TOWEL DISPOSABLE) ×3 IMPLANT
TUBING ART PRESS 48 MALE/FEM (TUBING) IMPLANT
WATER STERILE IRR 1000ML POUR (IV SOLUTION) ×3 IMPLANT

## 2017-11-19 NOTE — Anesthesia Procedure Notes (Deleted)
Performed by: Stark Aguinaga, CRNA       

## 2017-11-19 NOTE — Anesthesia Preprocedure Evaluation (Addendum)
Anesthesia Evaluation  Patient identified by MRN, date of birth, ID band Patient awake    Reviewed: Allergy & Precautions, NPO status , Patient's Chart, lab work & pertinent test results  Airway Mallampati: I  TM Distance: >3 FB Neck ROM: Full    Dental no notable dental hx. (+) Teeth Intact, Dental Advisory Given   Pulmonary Current Smoker,    Pulmonary exam normal breath sounds clear to auscultation       Cardiovascular hypertension, Pt. on medications + Peripheral Vascular Disease (R ICA stenosis)  Normal cardiovascular exam Rhythm:Regular Rate:Normal  TTE 07/2017 Left ventricle: The cavity size was normal. There was mild focal basal hypertrophy of the septum. Systolic function was normal. The estimated ejection fraction was in the range of 55% to 60%. Wall motion was normal; there were no regional wall motion abnormalities. Features are consistent with a pseudonormal left ventricular filling pattern, with concomitant abnormal relaxation and increased filling pressure (grade 2 diastolic dysfunction).   - Aortic valve: Transvalvular velocity was within the normal range. There was no stenosis. There was no regurgitation. - Mitral valve: Transvalvular velocity was within the normal range. There was no evidence for stenosis. There was no regurgitation. - Left atrium: The atrium was moderately dilated. - Right ventricle: The cavity size was normal. Wall thickness was normal. Systolic function was normal. - Atrial septum: No defect or patent foramen ovale was identified by color flow Doppler. - Tricuspid valve: There was no regurgitation. - Pericardium, extracardiac: A trivial pericardial effusion was identified.  Carotid Duplex 10/2017 Right Carotid: Velocities in the right ICA are consistent with a 60-79% stenosis. Calcific plaque may obscure higher velocities. Non-hemodynamically significant plaque <50% noted in the CCA.  Left Carotid:  Velocities in the left ICA are consistent with a 40-59% stenosis. Non-hemodynamically significant plaque noted in the CCA.  Vertebrals: Bilateral vertebral arteries demonstrate antegrade flow.  Subclavians: Right subclavian artery flow was disturbed. Normal flow hemodynamics were seen in the left subclavian artery.   Neuro/Psych CVA (May 2019), Residual Symptoms negative psych ROS   GI/Hepatic GERD  ,(+)     substance abuse (using cocaine at time of stroke, has not used since May 2019)  ,   Endo/Other  negative endocrine ROS  Renal/GU negative Renal ROS  negative genitourinary   Musculoskeletal negative musculoskeletal ROS (+)   Abdominal   Peds  Hematology negative hematology ROS (+)   Anesthesia Other Findings   Reproductive/Obstetrics                            Anesthesia Physical Anesthesia Plan  ASA: III  Anesthesia Plan: General   Post-op Pain Management:    Induction: Intravenous  PONV Risk Score and Plan: 1 and Dexamethasone and Ondansetron  Airway Management Planned: Oral ETT  Additional Equipment: Arterial line  Intra-op Plan:   Post-operative Plan: Extubation in OR  Informed Consent: I have reviewed the patients History and Physical, chart, labs and discussed the procedure including the risks, benefits and alternatives for the proposed anesthesia with the patient or authorized representative who has indicated his/her understanding and acceptance.   Dental advisory given  Plan Discussed with: CRNA  Anesthesia Plan Comments:        Anesthesia Quick Evaluation

## 2017-11-19 NOTE — Anesthesia Procedure Notes (Signed)
Procedure Name: Intubation Date/Time: 11/19/2017 11:24 AM Performed by: Rosiland OzMeyers, Vere Diantonio, CRNA Pre-anesthesia Checklist: Patient identified, Emergency Drugs available, Suction available and Patient being monitored Patient Re-evaluated:Patient Re-evaluated prior to induction Oxygen Delivery Method: Circle system utilized Preoxygenation: Pre-oxygenation with 100% oxygen Induction Type: IV induction Ventilation: Mask ventilation without difficulty and Oral airway inserted - appropriate to patient size Laryngoscope Size: Miller and 2 Grade View: Grade I Tube type: Oral Tube size: 8.0 mm Number of attempts: 2 Airway Equipment and Method: Stylet Dental Injury: Teeth and Oropharynx as per pre-operative assessment

## 2017-11-19 NOTE — H&P (Signed)
   History and Physical Update  The patient was interviewed and re-examined.  The patient's previous History and Physical has been reviewed and is unchanged from recent office visit.  Plan is for right carotid endarterectomy.  Bunyan Brier C. Randie Heinzain, MD [facility] [place] Office: [phone] Pager: [phone]   11/19/2017, 10:23 AM

## 2017-11-19 NOTE — Anesthesia Procedure Notes (Signed)
Arterial Line Insertion Performed by: Rachel MouldsLee, Nahdia Doucet B, CRNA, CRNA  Patient location: Pre-op. Preanesthetic checklist: patient identified, IV checked, site marked, risks and benefits discussed, surgical consent, monitors and equipment checked, pre-op evaluation and timeout performed Lidocaine 1% used for infiltration Left was placed Catheter size: 20 G Hand hygiene performed  and maximum sterile barriers used  Allen's test indicative of satisfactory collateral circulation Attempts: 2 Procedure performed without using ultrasound guided technique. Following insertion, Biopatch and dressing applied. Post procedure assessment: normal  Patient tolerated the procedure well with no immediate complications.

## 2017-11-19 NOTE — Anesthesia Postprocedure Evaluation (Signed)
Anesthesia Post Note  Patient: [name] [name]  Procedure(s) Performed: ENDARTERECTOMY CAROTID (Right Neck) PATCH ANGIOPLASTY (Right Neck)     Patient location during evaluation: PACU Anesthesia Type: General Level of consciousness: awake and alert Pain management: pain level controlled Vital Signs Assessment: post-procedure vital signs reviewed and stable Respiratory status: spontaneous breathing, nonlabored ventilation, respiratory function stable and patient connected to nasal cannula oxygen Cardiovascular status: blood pressure returned to baseline and stable Postop Assessment: no apparent nausea or vomiting Anesthetic complications: no    Last Vitals:  Vitals:   11/19/17 1400 11/19/17 1445  BP: 111/81 97/74  Pulse: 82 79  Resp: 16 16  Temp:    SpO2: 98% 97%    Last Pain:  Vitals:   11/19/17 1430  TempSrc:   PainSc: 9                  Chauntelle Azpeitia L Adriann Thau

## 2017-11-19 NOTE — Op Note (Addendum)
    Patient name: [name] [name] MRN: [id number] DOB: 04/21/1965 Sex: male  11/19/2017 Pre-operative Diagnosis: Symptomatic right carotid stenosis Post-operative diagnosis:  Same Surgeon:  Apolinar JunesBrandon C. Randie Heinzain, MD Assistant: Aggie MoatsMatt Eveland, PA Procedure Performed: Right carotid endarterectomy with bovine pericardial patch angioplasty  Indications: 52 year old male who has a previous history of significant size right brain stroke.  He has high-grade stenosis of his right internal carotid artery and is indicated for endarterectomy having recovered with Rankin score 2.   Findings: There was high-grade stenosis from his distal common carotid artery extending into his internal carotid artery.  At completion there was strong signal in the internal carotid artery with flow throughout diastole and similar signal in the external carotid artery.   Procedure:  The patient was identified in the holding area and taken to the operating room where he was placed supine the operative table general anesthesia was induced.  He was sterilely prepped and draped in the right neck chest in the usual fashion given antibiotics and a timeout was called.  We began with a longitudinal incision along the anterior border the sternocleidomastoid and dissected down.  We first encountered multiple facial vein branches and these were divided between ties.  We also encountered overlying ansa cervicalis which was retracted laterally and medially.  We were able to gain control proximally around the common carotid artery placed an umbilical tape.  We then exposed the superior thyroidal artery placed a vessel loop around this as well as the external carotid artery distally to the superior thyroidal.  Patient was then given 7000 units of heparin and ACT returned greater than 250.  We dissected up onto the internal carotid artery which time patient did become bradycardic and Robinul was administered.  We identified our hypoglossal nerve protected this.   We did divide our ansa posteriorly to gain further exposure.  We identified an area of normal internal carotid artery and placed a vessel loop around this.  We prepared initially a 10 [place] shunt clamped our internal followed by common external arteries.  We open the vessel longitudinally attempt to place a 10 [place] shunt but it was too large given the atretic distal internal carotid artery.  We then placed an 8 [place] shunt distally allowed to backbleed then placed approximately.  Flow was confirmed with Doppler.  We then proceeded with endarterectomy including eversion of the external.  Distal internal carotid artery did not fill the well and so for individual 7-0 Prolene tacking sutures were placed.  We then trimmed a bovine pericardial patch to size and sewn in place with 6-0 Prolene suture.  Prior to completing anastomosis we removed the shunt flushed in all directions flushed with heparinized saline after this.  Upon completing we did have one area proximally that was repaired with an interrupted 6-0 Prolene suture.  We then had a good signal that was expected with diastolic flow in the internal as well as the external carotid arteries.  50 mg of protamine was administered.  We irrigated the wound obtain hemostasis.  We then closed in layers with Vicryl and Monocryl.  He was awakened from anesthesia was at his neurologic baseline and was transferred to the PACU in stable condition.  EBL: 50cc   Adamarie Izzo C. Randie Heinzain, MD [facility] [place] Office: [phone] Pager: [phone]

## 2017-11-19 NOTE — Transfer of Care (Signed)
Immediate Anesthesia Transfer of Care Note  Patient: [name] [name]  Procedure(s) Performed: ENDARTERECTOMY CAROTID (Right Neck) PATCH ANGIOPLASTY (Right Neck)  Patient Location: PACU  Anesthesia Type:General  Level of Consciousness: awake and patient cooperative  Airway & Oxygen Therapy: Patient Spontanous Breathing and Patient connected to face mask oxygen  Post-op Assessment: Report given to RN and Post -op Vital signs reviewed and stable  Post vital signs: Reviewed and stable  Last Vitals:  Vitals Value Taken Time  BP 106/86 11/19/2017  1:30 PM  Temp    Pulse 95 11/19/2017  1:34 PM  Resp 14 11/19/2017  1:34 PM  SpO2 97 % 11/19/2017  1:34 PM  Vitals shown include unvalidated device data.  Last Pain:  Vitals:   11/19/17 0836  TempSrc:   PainSc: 0-No pain         Complications: No apparent anesthesia complications

## 2017-11-20 ENCOUNTER — Telehealth: Payer: Self-pay | Admitting: Vascular Surgery

## 2017-11-20 ENCOUNTER — Encounter (HOSPITAL_COMMUNITY): Payer: Self-pay | Admitting: Vascular Surgery

## 2017-11-20 LAB — CBC
HCT: 42 % (ref 39.0–52.0)
Hemoglobin: 14.1 g/dL (ref 13.0–17.0)
MCH: 33.3 pg (ref 26.0–34.0)
MCHC: 33.6 g/dL (ref 30.0–36.0)
MCV: 99.1 fL (ref 78.0–100.0)
PLATELETS: 222 10*3/uL (ref 150–400)
RBC: 4.24 MIL/uL (ref 4.22–5.81)
RDW: 12 % (ref 11.5–15.5)
WBC: 15.8 10*3/uL — AB (ref 4.0–10.5)

## 2017-11-20 LAB — BASIC METABOLIC PANEL
Anion gap: 12 (ref 5–15)
BUN: 9 mg/dL (ref 6–20)
CALCIUM: 8.7 mg/dL — AB (ref 8.9–10.3)
CO2: 21 mmol/L — ABNORMAL LOW (ref 22–32)
CREATININE: 0.82 mg/dL (ref 0.61–1.24)
Chloride: 103 mmol/L (ref 98–111)
GFR calc Af Amer: >60 mL/min (ref >60)
GFR calc non Af Amer: >60 mL/min (ref >60)
GLUCOSE: 170 mg/dL — AB (ref 70–99)
Potassium: 4.3 mmol/L (ref 3.5–5.1)
SODIUM: 136 mmol/L (ref 135–145)

## 2017-11-20 MED ORDER — OXYCODONE HCL 5 MG PO TABS: 5.0000 mg | ORAL_TABLET | Freq: Four times a day (QID) | ORAL | 0 refills | Status: DC | PRN

## 2017-11-20 NOTE — Progress Notes (Addendum)
[facility]  Subjective  - Right neck incision is sore, with a little facial edema.   Objective 123/89 63 97.9 F (36.6 C) (Oral) 19 96%  Intake/Output Summary (Last 24 hours) at 11/20/2017 0727 Last data filed at 11/20/2017 [id number] Gross per 24 hour  Intake 4790.97 ml  Output 950 ml  Net 3840.97 ml    Right neck incision healing well without hematoma Left tongue devasation, and facial droop on the left Left UE weakness with loss of shoulder and hand control, recovering left LE weakness Heart RRR Lungs non labored breathing   Assessment/Planning: POD # Right CEA  Baseline post CVA symptoms without new symptoms Left ICA 40-59% asymptomatic Plan for discharge  F/U in 2weeks  Mosetta Pigeonmma Maureen Collins 11/20/2017 7:27 AM --  Laboratory Lab Results: Recent Labs    11/20/17 0330  WBC 15.8*  HGB 14.1  HCT 42.0  PLT 222   BMET Recent Labs    11/20/17 0330  NA 136  K 4.3  CL 103  CO2 21*  GLUCOSE 170*  BUN 9  CREATININE 0.82  CALCIUM 8.7*    COAG Lab Results  Component Value Date   INR 1.11 11/12/2017   INR 1.05 07/27/2017   No results found for: PTT  I have independently interviewed and examined patient and agree with PA assessment and plan above.   Ayanah Snader C. Randie Heinzain, MD [facility] [place] Office: [phone] Pager: [phone]

## 2017-11-20 NOTE — Discharge Instructions (Signed)
   [facility]  Discharge Instructions   Carotid Endarterectomy (CEA)  Please refer to the following instructions for your post-procedure care. Your surgeon or physician assistant will discuss any changes with you.  Activity  You are encouraged to walk as much as you can. You can slowly return to normal activities but must avoid strenuous activity and heavy lifting until your doctor tell you it's OK. Avoid activities such as vacuuming or swinging a golf club. You can drive after one week if you are comfortable and you are no longer taking prescription pain medications. It is normal to feel tired for serval weeks after your surgery. It is also normal to have difficulty with sleep habits, eating, and bowel movements after surgery. These will go away with time.  Bathing/Showering  You may shower after you come home. Do not soak in a bathtub, hot tub, or swim until the incision heals completely.  Incision Care  Shower every day. Clean your incision with mild soap and water. Pat the area dry with a clean towel. You do not need a bandage unless otherwise instructed. Do not apply any ointments or creams to your incision. You may have skin glue on your incision. Do not peel it off. It will come off on its own in about one week. Your incision may feel thickened and raised for several weeks after your surgery. This is normal and the skin will soften over time. For Men Only: It's OK to shave around the incision but do not shave the incision itself for 2 weeks. It is common to have numbness under your chin that could last for several months.  Diet  Resume your normal diet. There are no special food restrictions following this procedure. A low fat/low cholesterol diet is recommended for all patients with vascular disease. In order to heal from your surgery, it is CRITICAL to get adequate nutrition. Your body requires vitamins, minerals, and protein. Vegetables are the best  source of vitamins and minerals. Vegetables also provide the perfect balance of protein. Processed food has little nutritional value, so try to avoid this.        Medications  Resume taking all of your medications unless your doctor or physician assistant tells you not to. If your incision is causing pain, you may take over-the- counter pain relievers such as acetaminophen (Tylenol). If you were prescribed a stronger pain medication, please be aware these medications can cause nausea and constipation. Prevent nausea by taking the medication with a snack or meal. Avoid constipation by drinking plenty of fluids and eating foods with a high amount of fiber, such as fruits, vegetables, and grains. Do not take Tylenol if you are taking prescription pain medications.  Follow Up  Our office will schedule a follow up appointment 2-3 weeks following discharge.  Please call us immediately for any of the following conditions  Increased pain, redness, drainage (pus) from your incision site. Fever of 101 degrees or higher. If you should develop stroke (slurred speech, difficulty swallowing, weakness on one side of your body, loss of vision) you should call 911 and go to the nearest emergency room.  Reduce your risk of vascular disease:  Stop smoking. If you would like help call [facility] at 1-800-QUIT-NOW (1-800-784-8669) or [place] at 336-586-4000. Manage your cholesterol Maintain a desired weight Control your diabetes Keep your blood pressure down  If you have any questions, please call the office at [phone].   

## 2017-11-20 NOTE — Telephone Encounter (Signed)
sch appt lvm 12/06/17 10am p/o MD

## 2017-11-20 NOTE — Progress Notes (Signed)
PT given AVS handout. Verbalized understanding. IV's removed. Pt stable at discharge.  Roselie AwkwardMegan Yeraldy Spike, RN, BSN

## 2017-11-20 NOTE — Care Management Note (Signed)
Case Management Note Donn PieriniKristi Antoin Dargis RN, BSN Unit 4E- RN Care Coordinator  [phone]  Patient Details  Name: Landis Gandydward D Whitehead MRN: [id number] Date of Birth: 07/28/1965  Subjective/Objective:    Pt admitted s/p CEA                Action/Plan: PTA pt; lived at home independent, plan to return home. No CM needs noted for transition home.   Expected Discharge Date:  11/20/17               Expected Discharge Plan:  Home/Self Care  In-House Referral:  NA  Discharge planning Services  CM Consult  Post Acute Care Choice:    Choice offered to:     DME Arranged:    DME Agency:     HH Arranged:    HH Agency:     Status of Service:  Completed, signed off  If discussed at Long Length of Stay Meetings, dates discussed:    Discharge Disposition: home/self care  Additional Comments:  Darrold SpanWebster, Dayjah Selman Hall, RN 11/20/2017, 10:54 AM

## 2017-11-26 NOTE — Discharge Summary (Signed)
[facility] Discharge Summary   Patient ID:  [name] [name] MRN: [id number] DOB/AGE: 52/09/1965 52 y.o.  Admit date: 11/19/2017 Discharge date: 11/20/2017 Date of Surgery: 11/19/2017 Surgeon: Surgeon(s): Maeola Harmanain, Brandon Christopher, MD  Admission Diagnosis: right carotid stenosis  Discharge Diagnoses:  right carotid stenosis  Secondary Diagnoses: Past Medical History:  Diagnosis Date  . Carotid artery occlusion   . GERD (gastroesophageal reflux disease)   . History of kidney stones   . Hypertension   . Nephrolithiasis   . Stroke [facility](HCC)    left arm does not work  . Tobacco abuse     Procedure(s): ENDARTERECTOMY CAROTID PATCH ANGIOPLASTY  Discharged Condition: good  HPI: [name] [name] is a 52 y.o. male evaluated in the hospital by Dr. Imogene Burnhen after quite a large right brain stroke.  This time he was noted to have a subtotally occluded right ICA with minimal stenosis in the left ICA.  He has been discharged on aspirin and statin drug.  The time of his stroke he had done cocaine.  Since that time he is quit cocaine.  His only residual deficits are some mild weakness of his left leg and inability to use his left upper extremity which is kept him from returning to work.  He also complains of erectile dysfunction since the stroke.  He has not had any recurrent strokes.   Carotid duplex which demonstrates 60 to 79% highly calcified stenosis on the right on the left he is 40 to 59% heterogeneous and calcific.  He has recovered well from the stroke and Dr. Randie Heinzain scheduled him for carotid endarterectomy to prevent further strokes.  Hospital Course:  [name] [name] is a 52 y.o. male is S/P Right Procedure(s): ENDARTERECTOMY CAROTID PATCH ANGIOPLASTY  Right neck incision healing well without hematoma Left tongue devasation, and facial droop on the left Left UE weakness with loss of shoulder and hand control, recovering left LE weakness Heart RRR Lungs non labored  breathing   Assessment/Planning: POD # 1 Right CEA  Baseline post CVA symptoms without new symptoms Left ICA 40-59% asymptomatic Plan for discharge  F/U in 2weeks   Significant Diagnostic Studies: CBC Lab Results  Component Value Date   WBC 15.8 (H) 11/20/2017   HGB 14.1 11/20/2017   HCT 42.0 11/20/2017   MCV 99.1 11/20/2017   PLT 222 11/20/2017    BMET    Component Value Date/Time   NA 136 11/20/2017 0330   K 4.3 11/20/2017 0330   CL 103 11/20/2017 0330   CO2 21 (L) 11/20/2017 0330   GLUCOSE 170 (H) 11/20/2017 0330   BUN 9 11/20/2017 0330   CREATININE 0.82 11/20/2017 0330   CALCIUM 8.7 (L) 11/20/2017 0330   GFRNONAA >60 11/20/2017 0330   GFRAA >60 11/20/2017 0330   COAG Lab Results  Component Value Date   INR 1.11 11/12/2017   INR 1.05 07/27/2017     Disposition:  Discharge to :Home Discharge Instructions    Call MD for:  redness, tenderness, or signs of infection (pain, swelling, bleeding, redness, odor or green/yellow discharge around incision site)   Complete by:  As directed    Call MD for:  severe or increased pain, loss or decreased feeling  in affected limb(s)   Complete by:  As directed    Call MD for:  temperature >100.5   Complete by:  As directed    Discharge instructions   Complete by:  As directed    Try suggesting a figure "8"  shoulder brace that is used for clavicle fractures to support you left shoulder.   Resume previous diet   Complete by:  As directed      Allergies as of 11/20/2017   No Known Allergies     Medication List    TAKE these medications   amLODipine 5 MG tablet Commonly known as:  NORVASC Take 5 mg by mouth daily.   aspirin 325 MG EC tablet Take 1 tablet (325 mg total) by mouth daily.   atorvastatin 40 MG tablet Commonly known as:  LIPITOR Take 1 tablet (40 mg total) by mouth daily at 6 PM. What changed:  when to take this   baclofen 10 MG tablet Commonly known as:  LIORESAL Take 1 tablet (10 mg total)  by mouth 3 (three) times daily.   famotidine 20 MG tablet Commonly known as:  PEPCID Take 20 mg by mouth at bedtime.   FLUoxetine 10 MG capsule Commonly known as:  PROZAC Take 1 capsule (10 mg total) by mouth daily at 8 pm. What changed:  when to take this   folic acid 1 MG tablet Commonly known as:  FOLVITE Take 1 tablet (1 mg total) by mouth daily. What changed:  when to take this   hydrochlorothiazide 12.5 MG capsule Commonly known as:  MICROZIDE Take 1 capsule (12.5 mg total) by mouth daily. What changed:  when to take this   lisinopril 5 MG tablet Commonly known as:  PRINIVIL,ZESTRIL Take 5 mg by mouth at bedtime.   Melatonin 5 MG Caps Take 10 mg by mouth at bedtime.   Melatonin 3 MG Tabs Take 1 tablet (3 mg total) by mouth daily at 8 pm.   omeprazole 40 MG capsule Commonly known as:  PRILOSEC Take 40 mg by mouth daily.   oxyCODONE 5 MG immediate release tablet Commonly known as:  Oxy IR/ROXICODONE Take 1 tablet (5 mg total) by mouth every 6 (six) hours as needed for moderate pain.   senna-docusate 8.6-50 MG tablet Commonly known as:  Senokot-S Take 2 tablets by mouth daily at 8 pm. What changed:    how much to take  when to take this  additional instructions   traZODone 50 MG tablet Commonly known as:  DESYREL Take 0.5-1 tablets (25-50 mg total) by mouth daily at 8 pm. What changed:    how much to take  when to take this      Verbal and written Discharge instructions given to the patient. Wound care per Discharge AVS Follow-up Information    Maeola Harman, MD Follow up in 2 week(s).   Specialties:  Vascular Surgery, Cardiology Why:  office will call Contact information: [address] [place] [address] [phone]           Signed: Mosetta Pigeon 11/26/2017, 10:13 AM   --- For VQI Registry use --- Instructions: Press F2 to tab through selections.  Delete question if not applicable.   Modified Rankin score at  D/C (0-6): Rankin Score=4  IV medication needed for:  1. Hypertension: No 2. Hypotension: No  Post-op Complications: No  1. Post-op CVA or TIA: No  If yes: Event classification (right eye, left eye, right cortical, left cortical, verterobasilar, other):   If yes: Timing of event (intra-op, <6 hrs post-op, >=6 hrs post-op, unknown):   2. CN injury: No  If yes: CN  injuried   3. Myocardial infarction: No  If yes: Dx by (EKG or clinical, Troponin):   4.  CHF:  No  5.  Dysrhythmia (new): No  6. Wound infection: No  7. Reperfusion symptoms: No  8. Return to OR: No  If yes: return to OR for (bleeding, neurologic, other CEA incision, other):   Discharge medications: Statin use:  Yes ASA use:  yes Beta blocker use:  No  for medical reason   ACE-Inhibitor use:  Yes P2Y12 Antagonist use: [x ] None, [ ]  Plavix, [ ]  Plasugrel, [ ]  Ticlopinine, [ ]  Ticagrelor, [ ]  Other, [ ]  No for medical reason, [ ]  Non-compliant, [ ]  Not-indicated Anti-coagulant use:  [x ] None, [ ]  Warfarin, [ ]  Rivaroxaban, [ ]  Dabigatran, [ ]  Other, [ ]  No for medical reason, [ ]  Non-compliant, [ ]  Not-indicated

## 2017-12-06 ENCOUNTER — Other Ambulatory Visit: Payer: Self-pay

## 2017-12-06 ENCOUNTER — Ambulatory Visit (INDEPENDENT_AMBULATORY_CARE_PROVIDER_SITE_OTHER): Payer: Self-pay | Admitting: Vascular Surgery

## 2017-12-06 ENCOUNTER — Encounter: Payer: Self-pay | Admitting: Vascular Surgery

## 2017-12-06 VITALS — BP 126/86 | HR 93 | Temp 98.2°F | Resp 14 | Ht 65.0 in | Wt 144.0 lb

## 2017-12-06 DIAGNOSIS — I6521 Occlusion and stenosis of right carotid artery: Secondary | ICD-10-CM

## 2017-12-06 NOTE — Progress Notes (Signed)
    Subjective:     Patient ID: [name] [name], male   DOB: November 30, 1965, 52 y.o.   MRN: [id number]  HPI 52 year old male follows up after right carotid endarterectomy for symptomatic disease.  He continues to have left shoulder issues following his stroke but overall is doing well.  He does have numbness side of his incision.   Review of Systems Right neck numbness    Objective:   Physical Exam Awake alert oriented Neurologic status is at baseline from preoperative Well-healed right neck incision    Assessment:     52 year old male status post right carotid endarterectomy for symptomatic disease    Plan:     Doing well can follow-up in 6 months with carotid duplex and continue aspirin in the time.  Okay to resume physical therapy     Camyra Vaeth C. Randie Heinz, MD [facility] Office: [phone] Pager: [phone]

## 2017-12-27 ENCOUNTER — Encounter
Payer: Managed Care, Other (non HMO) | Attending: Physical Medicine & Rehabilitation | Admitting: Physical Medicine & Rehabilitation

## 2017-12-27 DIAGNOSIS — I6521 Occlusion and stenosis of right carotid artery: Secondary | ICD-10-CM | POA: Insufficient documentation

## 2017-12-27 DIAGNOSIS — Z833 Family history of diabetes mellitus: Secondary | ICD-10-CM | POA: Insufficient documentation

## 2017-12-27 DIAGNOSIS — K219 Gastro-esophageal reflux disease without esophagitis: Secondary | ICD-10-CM | POA: Insufficient documentation

## 2017-12-27 DIAGNOSIS — Z8042 Family history of malignant neoplasm of prostate: Secondary | ICD-10-CM | POA: Insufficient documentation

## 2017-12-27 DIAGNOSIS — Z8041 Family history of malignant neoplasm of ovary: Secondary | ICD-10-CM | POA: Insufficient documentation

## 2017-12-27 DIAGNOSIS — Z9889 Other specified postprocedural states: Secondary | ICD-10-CM | POA: Insufficient documentation

## 2017-12-27 DIAGNOSIS — Z823 Family history of stroke: Secondary | ICD-10-CM | POA: Insufficient documentation

## 2017-12-27 DIAGNOSIS — Z808 Family history of malignant neoplasm of other organs or systems: Secondary | ICD-10-CM | POA: Insufficient documentation

## 2017-12-27 DIAGNOSIS — F17210 Nicotine dependence, cigarettes, uncomplicated: Secondary | ICD-10-CM | POA: Insufficient documentation

## 2017-12-27 DIAGNOSIS — Z801 Family history of malignant neoplasm of trachea, bronchus and lung: Secondary | ICD-10-CM | POA: Insufficient documentation

## 2017-12-27 DIAGNOSIS — I10 Essential (primary) hypertension: Secondary | ICD-10-CM | POA: Insufficient documentation

## 2017-12-27 DIAGNOSIS — Z811 Family history of alcohol abuse and dependence: Secondary | ICD-10-CM | POA: Insufficient documentation

## 2017-12-27 DIAGNOSIS — I63511 Cerebral infarction due to unspecified occlusion or stenosis of right middle cerebral artery: Secondary | ICD-10-CM | POA: Insufficient documentation

## 2018-01-17 ENCOUNTER — Encounter: Payer: Self-pay | Admitting: Adult Health

## 2018-01-17 ENCOUNTER — Ambulatory Visit (INDEPENDENT_AMBULATORY_CARE_PROVIDER_SITE_OTHER): Payer: 59 | Admitting: Adult Health

## 2018-01-17 VITALS — BP 141/97 | HR 76 | Ht 65.0 in | Wt 142.6 lb

## 2018-01-17 DIAGNOSIS — I63411 Cerebral infarction due to embolism of right middle cerebral artery: Secondary | ICD-10-CM | POA: Diagnosis not present

## 2018-01-17 DIAGNOSIS — E785 Hyperlipidemia, unspecified: Secondary | ICD-10-CM | POA: Diagnosis not present

## 2018-01-17 DIAGNOSIS — I10 Essential (primary) hypertension: Secondary | ICD-10-CM

## 2018-01-17 DIAGNOSIS — G8114 Spastic hemiplegia affecting left nondominant side: Secondary | ICD-10-CM | POA: Diagnosis not present

## 2018-01-17 MED ORDER — GABAPENTIN 300 MG PO CAPS: 300.0000 mg | ORAL_CAPSULE | Freq: Every day | ORAL | 0 refills | Status: DC

## 2018-01-17 MED ORDER — BACLOFEN 10 MG PO TABS: 10.0000 mg | ORAL_TABLET | Freq: Three times a day (TID) | ORAL | 3 refills | Status: DC

## 2018-01-17 NOTE — Patient Instructions (Signed)
Continue aspirin 81 mg daily  and lipitor  for secondary stroke prevention  Continue to follow up with PCP regarding cholesterol and blood pressure management   Start gabapentin 300mg  nightly for shoulder pain and nerve pain; after 1-2 weeks, please let us know if you continue to experience pain, and we an consider increasing at that time  Continue baclofen but ensure you are taking 10mg  three times daily  Continue to do home exercises along with ensuring you are doing range of motion on your arm  Continue to monitor blood pressure at home  Maintain strict control of hypertension with blood pressure goal below 130/90, diabetes with hemoglobin A1c goal below 6.5% and cholesterol with LDL cholesterol (bad cholesterol) goal below 70 mg/dL. I also advised the patient to eat a healthy diet with plenty of whole grains, cereals, fruits and vegetables, exercise regularly and maintain ideal body weight.  Followup in the future with me in 6 months or call earlier if needed       Thank you for coming to see us at [facility]. I hope we have been able to provide you high quality care today.  You may receive a patient satisfaction survey over the next few weeks. We would appreciate your feedback and comments so that we may continue to improve ourselves and the health of our patients.

## 2018-01-17 NOTE — Progress Notes (Signed)
[facility] [address] [place]. [place] [address] [phone]       OFFICE FOLLOW-UP NOTE  [name] Date of Birth:  [birth year] Medical Record Number:  [id number]   HPI: Mr. Huttner is a 52 year old Caucasian male seen today in the office for follow-up visit after hospital admission for stroke in May 2019. He is accompanied by his wife. History is obtained from them and review of electronic medical records. I personally reviewed imaging films. The patient woke up on 07/27/88 and with left hemiplegia but refused to go to the year until that evening. Exam he had dense left hemiplegia and CT scan showed hypodensity in the right MCA with aspects score of 3-4. CT venogram showed high-grade right ICA stenosis with right distal M1 occlusion. Infarct core was 69 mL with a penumbra of 133 mL favorable ratio 1.9. NIH stroke scale was 13. Patient was not considered a candidate for intervention.Marland Kitchen MRI scan showed a large right MCA infarct with cytotoxic edema. He was kept in ICU initially and treated with hypertonic saline. After his condition stabilizes transferred out. LDL cholesterol is 106 mg percent. Urine drug screen was positive for cocaine. He also had history of long-standing tobacco abuse. Transthoracic echo showed normal ejection fraction. Hemoglobin A1c was 5.0. Vascular surgery was consulted and recommended elective. Reconsideration for carotid surgery after his recovery from rehabilitation.  10/17/2017 visit  PS: patient transferred to inpatient rehabilitation where he spent several weeks and is presently living at home. Is able to ablate independently. His left leg strength is improved though he still has left foot drop and walks dragging his left leg. He's had no falls or injuries. His left upper extremity strength is improving but he still has significant weakness particularly in his grip and hand. He is currently point spitting and outpatient physical therapy in [place].  He has cut back smoking but has not quit completely. He is tolerating aspirin well without bruising or bleeding. His blood pressure is well controlled and today it is 139/72. Is tolerating Lipitor well without muscle aches and pains. He complains of spasms in his legs and has been started on baclofen but has been taking it only twice daily. He did see Dr. Imogene Burn for outpatient follow-up to discuss carotid surgery and plan is to obtain a follow-up vascular imaging study but that has not yet been done.  Interval history 01/17/2018: Patient returns today for follow-up visit.  He did undergo repeat carotid Dopplers on 11/01/2017 which showed right ICA 60 to 79% highly calcified stenosis and left ICA 40 to 59% heterogeneous and calcific stenosis.  He did undergo right carotid enterectomy on 11/19/2016 without complications.  Recommended to repeat carotid duplex in 6 months time along with continuation of aspirin.  He continues to experience left hemiparesis with spasticity and pain.  He has been receiving Botox injections by Dr. Allena Katz but also endorses difficulty with continuation of payments.  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  He has had mild improvement with his left hemiparesis since prior visit.  He does endorse continuation of home therapy exercises on his own.  He is currently in the process of attempting to obtain long-term disability due to continued deficits.  He is very frustrated as he is unable to return to work as he does state he enjoys working but is aware of his limitations and states that if he does  attempt to return to work, will be a safety issue and risk.  He does continue to live independently where he is able to perform all ADLs and IADLs.  He continues to take aspirin 325 mg without side effects of bleeding or bruising.  Continues to take atorvastatin without side effects myalgias.  Blood pressure  today satisfactory 141/97.  He does endorse pain in his left shoulder and has been prescribed baclofen 10 mg 3 times daily but states he was told to take 0.5 tabs twice daily.  He declines any benefit from medication.  He does endorse burning and radiating sensation in his left upper extremity which makes it difficult to sleep at night.  No further concerns at this time.  Denies new or worsening stroke/TIA symptoms.    ROS:   14 system review of systems is positive for walking difficulty, weakness and joint pain and all the systems negative  PMH:  Past Medical History:  Diagnosis Date  . Carotid artery occlusion   . GERD (gastroesophageal reflux disease)   . History of kidney stones   . Hypertension   . Nephrolithiasis   . Stroke [facility])    left arm does not work  . Tobacco abuse     Social History:  Social History   Socioeconomic History  . Marital status: Single    Spouse name: Not on file  . Number of children: 0  . Years of education: Not on file  . Highest education level: Not on file  Occupational History  . Occupation: Stage manager  Social Needs  . Financial resource strain: Not on file  . Food insecurity:    Worry: Not on file    Inability: Not on file  . Transportation needs:    Medical: Not on file    Non-medical: Not on file  Tobacco Use  . Smoking status: Current Every Day Smoker    Packs/day: 0.50    Years: 25.00    Pack years: 12.50    Types: Cigarettes  . Smokeless tobacco: Never Used  Substance and Sexual Activity  . Alcohol use: Not Currently  . Drug use: Not Currently    Types: Marijuana, "Crack" cocaine    Comment: none at this time  . Sexual activity: Not on file  Lifestyle  . Physical activity:    Days per week: Not on file    Minutes per session: Not on file  . Stress: Not on file  Relationships  . Social connections:    Talks on phone: Not on file    Gets together: Not on file    Attends religious service: Not on file    Active member of  club or organization: Not on file    Attends meetings of clubs or organizations: Not on file    Relationship status: Not on file  . Intimate partner violence:    Fear of current or ex partner: Not on file    Emotionally abused: Not on file    Physically abused: Not on file    Forced sexual activity: Not on file  Other Topics Concern  . 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 amLODipine (NORVASC) 5 MG tablet Take 5 mg by mouth daily.    Marland Kitchen aspirin EC 325 MG  EC tablet Take 1 tablet (325 mg total) by mouth daily. 30 tablet 0  . atorvastatin (LIPITOR) 40 MG tablet Take 1 tablet (40 mg total) by mouth daily at 6 PM. (Patient taking differently: Take 40 mg by mouth daily. ) 30 tablet 0  . baclofen (LIORESAL) 10 MG tablet Take 1 tablet (10 mg total) by mouth 3 (three) times daily. 90 each 1  . famotidine (PEPCID) 20 MG tablet Take 20 mg by mouth at bedtime.   1  . FLUoxetine (PROZAC) 10 MG capsule Take 1 capsule (10 mg total) by mouth daily at 8 pm. (Patient taking differently: Take 10 mg by mouth daily. ) 30 capsule 0  . folic acid (FOLVITE) 1 MG tablet Take 1 tablet (1 mg total) by mouth daily. (Patient taking differently: Take 1 mg by mouth at bedtime. ) 30 tablet 0  . hydrochlorothiazide (HYDRODIURIL) 12.5 MG tablet Take 12.5 mg by mouth daily.  0  . hydrochlorothiazide (MICROZIDE) 12.5 MG capsule Take 1 capsule (12.5 mg total) by mouth daily. (Patient taking differently: Take 12.5 mg by mouth every other day. ) 30 capsule 0  . lisinopril (PRINIVIL,ZESTRIL) 5 MG tablet Take 5 mg by mouth at bedtime.   2  . Melatonin 3 MG TABS Take 1 tablet (3 mg total) by mouth daily at 8 pm. 30 tablet 0  . Melatonin 5 MG CAPS Take 10 mg by mouth at bedtime.    Marland Kitchen omeprazole (PRILOSEC) 40 MG capsule Take 40 mg by  mouth daily.    Marland Kitchen oxyCODONE (ROXICODONE) 5 MG immediate release tablet Take 1 tablet (5 mg total) by mouth every 6 (six) hours as needed for moderate pain. 15 tablet 0  . senna-docusate (SENOKOT-S) 8.6-50 MG tablet Take 2 tablets by mouth daily at 8 pm. (Patient taking differently: Take 1-2 tablets by mouth at bedtime. Alternate taking 1 tablet one night and 2 tablets the next) 60 tablet 0  . traZODone (DESYREL) 50 MG tablet Take 0.5-1 tablets (25-50 mg total) by mouth daily at 8 pm. (Patient taking differently: Take 50 mg by mouth at bedtime. ) 30 tablet 0   No current facility-administered medications on file prior to visit.     Allergies:  No Known Allergies  Physical Exam General: well developed, well nourished pleasant middle-age Caucasian male, seated, in no evident distress Head: head normocephalic and atraumatic.  Neck: supple with soft right carotid   bruit Cardiovascular: regular rate and rhythm, no murmurs Musculoskeletal: no deformity Skin:  no rash/petichiae Vascular:  Normal pulses all extremities Vitals:   01/17/18 1034  BP: (!) 141/97  Pulse: 76   Neurologic Exam Mental Status: Awake and fully alert. Oriented to place and time. Recent and remote memory intact. Attention span, concentration and fund of knowledge appropriate. Mood and affect appropriate.  Cranial Nerves: Fundoscopic exam reveals sharp disc margins. Pupils unequal with right being slightly smaller than the left, briskly reactive to light. Extraocular movements full without nystagmus. Visual fields full to confrontation. Hearing intact. Facial sensation intact. Mild left lower facial weakness., tongue, palate moves normally and symmetrically.  Motor: Normal bulk and tone. Normal strength in all tested extremity muscles on the right side only.  LUE: 3/5 proximal and significant weakness in left grip with spasticity; limited ROM due to shoulder pain LLE: 4/5 with slight ankle dorsiflexion weakness Sensory.:  decreased sensation left upper and lower extremity compared to right upper and lower extremity Coordination: Rapid alternating movements normal in right upper extremity. Finger-to-nose and  heel-to-shin performed accurately on right side.  Unable to perform finger tapping with left hand. Gait and Station: Arises from chair without difficulty. Stance is normal. Gait demonstrates spastic hemiplegic gait with left foot drop and dragging of the left leg with circumduction  Reflexes: 2+ and asymmetric brisker on the left. Toes downgoing.     ASSESSMENT: 52 year old Caucasian male with right MCA infarct due to symptomatic high-grade proximal right carotid stenosis in May 2019 with residual spastic left hemiplegia. Vascular risk factors of smoking, hypertension, hyperlipidemia and carotid stenosis.  Patient underwent right carotid enterectomy on 11/19/2017 without complication.  He is being seen today for follow-up visit and does continue to have left spastic hemiparesis but denies new or worsening stroke/TIA symptoms.     PLAN: 1. Right MCA infarct: Continue aspirin 325 mg daily  and atorvastatin for secondary stroke prevention. Maintain strict control of hypertension with blood pressure goal below 130/90, diabetes with hemoglobin A1c goal below 6.5% and cholesterol with LDL cholesterol (bad cholesterol) goal below 70 mg/dL.  I also advised the patient to eat a healthy diet with plenty of whole grains, cereals, fruits and vegetables, exercise regularly with at least 30 minutes of continuous activity daily and maintain ideal body weight. 2. Spastic left hemiplegia: Advised to use baclofen 10 mg 3 times daily for continued pain; possible nerve pain and recommended initiating gabapentin 300 mg nightly.  Advised him that after 1 to 2 weeks, if he continues to experience nerve pain and is tolerating well, we can consider increasing at that time.  Advised him to continue to do home exercises that were provided at  completion of therapies for continued deficits.  Due to continued stroke deficit, I do not believe that it would be safe for patient to return to work at this time.  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  He continues to work with a Clinical research [employer]el[employer] in order to obtain disability.  He was also advised to speak with PCP in regards to possible need of long-term disability. 3. HTN: Advised to continue current treatment regimen.  Today's BP 141/97.  Advised to continue to monitor at home along with continued follow-up with PCP for management 4. HLD: Advised to continue current treatment regimen along with continued follow-up with PCP for future prescribing and monitoring of lipid panel   Greater than 50% of time during this 25 minute visit was spent on counseling,explanation of diagnosis of carotid stenosis and spastic hemiplegia, planning of further management, discussion with patient and family and coordination of care   George HughJessica VanSchaick, AGNP-BC  [facility] [address] [place], [place] [id number]  Phone [phone] Fax [phone] Note: This document was prepared with digital dictation and possible smart phrase technology. Any transcriptional errors that result from this process are unintentional.

## 2018-01-18 NOTE — Progress Notes (Signed)
I agree with the above plan 

## 2018-01-27 ENCOUNTER — Other Ambulatory Visit: Payer: Self-pay | Admitting: Adult Health

## 2018-01-27 MED ORDER — GABAPENTIN 300 MG PO CAPS: 300.0000 mg | ORAL_CAPSULE | Freq: Two times a day (BID) | ORAL | 0 refills | Status: DC

## 2018-01-27 NOTE — Addendum Note (Signed)
Addended by: Guy BeginYOUNG, SANDRA S on: 01/27/2018 01:23 PM   Modules accepted: Orders

## 2018-01-27 NOTE — Telephone Encounter (Signed)
Per ofv note, may increase (I initiated order for gabapentin 300mg  po BID # 60)

## 2018-01-27 NOTE — Telephone Encounter (Signed)
Pt calling to inform that re: the gabapentin (NEURONTIN) 300 MG capsule he didn't feel any relief from 1 a day so he began also taking 1 at lunch time as well and that has helped greatly.  Pt states he is down to 2 pills and would like a refill called into  [facility] [phone]

## 2018-01-28 NOTE — Telephone Encounter (Signed)
I relayed to pt to let him know that one month refill sent in. He verbalized understanding.

## 2018-02-08 ENCOUNTER — Other Ambulatory Visit: Payer: Self-pay | Admitting: Adult Health

## 2018-02-10 ENCOUNTER — Other Ambulatory Visit: Payer: Self-pay

## 2018-02-10 MED ORDER — GABAPENTIN 300 MG PO CAPS: 300.0000 mg | ORAL_CAPSULE | Freq: Two times a day (BID) | ORAL | 0 refills | Status: DC

## 2018-03-21 ENCOUNTER — Telehealth: Payer: Self-pay | Admitting: Physical Medicine & Rehabilitation

## 2018-03-21 NOTE — Telephone Encounter (Signed)
Thanks

## 2018-03-21 NOTE — Telephone Encounter (Signed)
pHONED deb Roughton wrk comp bnefits specialist and advised Allena Katz will not complete these fmla documents because has not seen patient since September - they can contact for a follow up- putting documents in [employer]ode[employer] upfront in mr mosers name

## 2018-03-26 ENCOUNTER — Encounter: Payer: Self-pay | Admitting: Adult Health

## 2018-03-26 ENCOUNTER — Telehealth: Payer: Self-pay | Admitting: Adult Health

## 2018-03-26 NOTE — Telephone Encounter (Addendum)
I called Odie Sera at is [phone] who handles FMLA assistance at pts job. I explain we dont have a FMLA form on file for patient. Stanton Kidney stated the previous FMLA was filled out by Dr. Allena Katz. She stated Dr. Allena Katz cannot do form because pt had not been seen since 11/2017. I stated our office last seen pt in 01/2018 ,and his next appt is in May 2020. Stanton Kidney stated the form is not for disability its for job security. They need to know how long pt will be out of work so his job will be secured. I explained a message will be sent to [employer] [facility] NP if she would review the form. Stanton Kidney stated okay.

## 2018-03-26 NOTE — Telephone Encounter (Signed)
Called patient to inform him that we have received his FMLA paperwork & informed him of the $50 fee prior to the doctor filling them out. Patient stated he will call back to pay this fee.

## 2018-03-26 NOTE — Telephone Encounter (Signed)
Revised. 

## 2018-03-26 NOTE — Telephone Encounter (Signed)
Please call patient to be seen sooner as he has not been seen since 01/2018 in order to fill out any FMLA paperwork

## 2018-03-26 NOTE — Telephone Encounter (Signed)
If patient or sister calls back he needs a sooner appt for the FMLA forms to be completed by Shanda Bumps NP. This is per Shanda Bumps NP note, thanks    Left vm for patients sister Rosey Bath to call back to r/s earlier appt for her brother. Vm was left he needs a sooner appt and there is a charge for the FMLA forms.

## 2018-03-27 NOTE — Telephone Encounter (Signed)
Pt made earlier appt with Shanda Bumps NP on 04/29/2018. Pt sent mychart message regarding FMLA forms.

## 2018-03-30 ENCOUNTER — Other Ambulatory Visit: Payer: Self-pay | Admitting: Neurology

## 2018-03-31 ENCOUNTER — Other Ambulatory Visit: Payer: Self-pay

## 2018-03-31 MED ORDER — GABAPENTIN 300 MG PO CAPS: 300.0000 mg | ORAL_CAPSULE | Freq: Two times a day (BID) | ORAL | 3 refills | Status: DC

## 2018-04-09 ENCOUNTER — Encounter: Payer: 59 | Admitting: Physical Medicine & Rehabilitation

## 2018-04-09 ENCOUNTER — Telehealth: Payer: Self-pay | Admitting: *Deleted

## 2018-04-09 NOTE — Telephone Encounter (Signed)
While this is likely true, it has been 5 months since I have seen the patient.  What is this regarding? I would need to see him again in follow up prior to addending a note from 5 months ago.

## 2018-04-09 NOTE — Telephone Encounter (Signed)
[name] [name] called and is requesting Dr Allena Katz add notes to his chart, " can't walk long distance and can't drive".

## 2018-04-10 NOTE — Telephone Encounter (Signed)
Thanks

## 2018-04-10 NOTE — Telephone Encounter (Signed)
[name] [name] has an appt with you on 04/17/18.

## 2018-04-17 ENCOUNTER — Encounter: Payer: 59 | Attending: Physical Medicine & Rehabilitation | Admitting: Physical Medicine & Rehabilitation

## 2018-04-17 ENCOUNTER — Encounter: Payer: Self-pay | Admitting: Physical Medicine & Rehabilitation

## 2018-04-17 VITALS — BP 150/86 | HR 103 | Ht 65.0 in | Wt 130.0 lb

## 2018-04-17 DIAGNOSIS — Z8042 Family history of malignant neoplasm of prostate: Secondary | ICD-10-CM | POA: Insufficient documentation

## 2018-04-17 DIAGNOSIS — S43002S Unspecified subluxation of left shoulder joint, sequela: Secondary | ICD-10-CM

## 2018-04-17 DIAGNOSIS — R269 Unspecified abnormalities of gait and mobility: Secondary | ICD-10-CM | POA: Diagnosis not present

## 2018-04-17 DIAGNOSIS — Z833 Family history of diabetes mellitus: Secondary | ICD-10-CM | POA: Insufficient documentation

## 2018-04-17 DIAGNOSIS — F17210 Nicotine dependence, cigarettes, uncomplicated: Secondary | ICD-10-CM | POA: Diagnosis not present

## 2018-04-17 DIAGNOSIS — G8114 Spastic hemiplegia affecting left nondominant side: Secondary | ICD-10-CM | POA: Diagnosis not present

## 2018-04-17 DIAGNOSIS — R531 Weakness: Secondary | ICD-10-CM | POA: Diagnosis not present

## 2018-04-17 DIAGNOSIS — I6521 Occlusion and stenosis of right carotid artery: Secondary | ICD-10-CM | POA: Diagnosis not present

## 2018-04-17 DIAGNOSIS — Z823 Family history of stroke: Secondary | ICD-10-CM | POA: Diagnosis not present

## 2018-04-17 DIAGNOSIS — I63511 Cerebral infarction due to unspecified occlusion or stenosis of right middle cerebral artery: Secondary | ICD-10-CM | POA: Diagnosis not present

## 2018-04-17 DIAGNOSIS — Z720 Tobacco use: Secondary | ICD-10-CM | POA: Diagnosis not present

## 2018-04-17 DIAGNOSIS — I10 Essential (primary) hypertension: Secondary | ICD-10-CM | POA: Diagnosis not present

## 2018-04-17 DIAGNOSIS — Z801 Family history of malignant neoplasm of trachea, bronchus and lung: Secondary | ICD-10-CM | POA: Diagnosis not present

## 2018-04-17 DIAGNOSIS — Z8041 Family history of malignant neoplasm of ovary: Secondary | ICD-10-CM | POA: Diagnosis not present

## 2018-04-17 DIAGNOSIS — Z808 Family history of malignant neoplasm of other organs or systems: Secondary | ICD-10-CM | POA: Diagnosis not present

## 2018-04-17 DIAGNOSIS — Z79899 Other long term (current) drug therapy: Secondary | ICD-10-CM | POA: Insufficient documentation

## 2018-04-17 MED ORDER — BACLOFEN 20 MG PO TABS: 20.0000 mg | ORAL_TABLET | Freq: Three times a day (TID) | ORAL | 1 refills | Status: DC

## 2018-04-17 NOTE — Progress Notes (Signed)
Subjective:    Patient ID: [name] [name], male    DOB: 06-Mar-1965, 53 y.o.   MRN: [id number]  HPI 54 year old male with history of HTN, Cocaine/ETOH/tobacco abuse presents for follow up for right MCA infarct.   Last clinic visit 9/135/19.  He had Botox injection at that time, but did not follow up.  Communication exchanged since that time regarding disability information. Patient states he had benefit with the injection, but cannot afford copay to come here. He has not followed up with Neurology. He is not wearing his WHO.  He is taking Baclofen with benefit. He is not driving. He continues to have shoulder pain. BP is elevated, states it runs high.  He states it is being managed by PCP. He continues to smoke 1/2 PPD.  He continues to drink 1/2-1 pint per day. He did not follow up with Vascular.   Pain Inventory Average Pain 5 Pain Right Now 7 My pain is aching  In the last 24 hours, has pain interfered with the following? General activity 4 Relation with others 4 Enjoyment of life 9 What TIME of day is your pain at its worst? na Sleep (in general) Poor  Pain is worse with: standing Pain improves with: na Relief from Meds: na  Mobility walk without assistance  Function employed # of hrs/week 40  Neuro/Psych weakness  Prior Studies Any changes since last visit?  no  Physicians involved in your care Any changes since last visit?  no   Family History  Problem Relation Age of Onset  . Prostate cancer Father 31  . Lung cancer Father   . Nephrolithiasis Father   . Ovarian cancer Mother   . CVA Paternal Grandmother        in her 43's  . Cancer Paternal Grandmother        eye  . Stroke Paternal Grandmother   . Diabetes Maternal Grandmother   . Alcoholism Maternal Uncle        x 3   Social History   Socioeconomic History  . Marital status: Single    Spouse name: Not on file  . Number of children: 0  . Years of education: Not on file  . Highest education level:  Not on file  Occupational History  . Occupation: Stage manager  Social Needs  . Financial resource strain: Not on file  . Food insecurity:    Worry: Not on file    Inability: Not on file  . Transportation needs:    Medical: Not on file    Non-medical: Not on file  Tobacco Use  . Smoking status: Current Every Day Smoker    Packs/day: 0.50    Years: 25.00    Pack years: 12.50    Types: Cigarettes  . Smokeless tobacco: Never Used  Substance and Sexual Activity  . Alcohol use: Not Currently  . Drug use: Not Currently    Types: Marijuana, "Crack" cocaine    Comment: none at this time  . Sexual activity: Not on file  Lifestyle  . Physical activity:    Days per week: Not on file    Minutes per session: Not on file  . Stress: Not on file  Relationships  . Social connections:    Talks on phone: Not on file    Gets together: Not on file    Attends religious service: Not on file    Active member of club or organization: Not on file    Attends meetings of  clubs or organizations: Not on file    Relationship status: Not on file  Other Topics Concern  . 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 CYSTOSCOPY W/ URETERAL STENT PLACEMENT Bilateral 09/07/2013   Procedure: CYSTOSCOPY WITH RETROGRADE PYELOGRAM/URETERAL STENT PLACEMENT;  Surgeon: Sebastian Acheheodore [name], MD;  Location: [facility];  Service: Urology;  Laterality: Bilateral;  . CYSTOSCOPY WITH RETROGRADE PYELOGRAM, URETEROSCOPY AND STENT PLACEMENT Bilateral 09/09/2013   Procedure: CYSTOSCOPY WITH RETROGRADE PYELOGRAM, DIAGNOSTIC URETEROSCOPY AND STENT CHANGE;  Surgeon: Sebastian Acheheodore [name], MD;  Location: [facility];  Service: Urology;  Laterality: Bilateral;  . ENDARTERECTOMY Right 11/19/2017   Procedure: ENDARTERECTOMY CAROTID;  Surgeon: Maeola Harmanain, Brandon Christopher, MD;  [facility];  Service: Vascular;   Laterality: Right;  . HOLMIUM LASER APPLICATION Right 09/07/2013   Procedure: HOLMIUM LASER APPLICATION;  Surgeon: Sebastian Acheheodore [name], MD;  Location: [facility];  Service: Urology;  Laterality: Right;  . HOLMIUM LASER APPLICATION Right 09/09/2013   Procedure: HOLMIUM LASER APPLICATION;  Surgeon: Sebastian Acheheodore [name], MD;  Location: [facility];  Service: Urology;  Laterality: Right;  . NEPHROLITHOTOMY Right 09/07/2013   Procedure: FIRST STAGE RIGHT PERCUTANEOUS NEPHROLITHOTOMY  WITH ACCESS, ;  Surgeon: Sebastian Acheheodore [name], MD;  Location: [facility];  Service: Urology;  Laterality: Right;  . NEPHROLITHOTOMY Right 09/09/2013   Procedure: RIGHT 2ND STAGE NEPHROLITHOTOMY PERCUTANEOUS;  Surgeon: Sebastian Acheheodore [name], MD;  Location: [facility];  Service: Urology;  Laterality: Right;  . PATCH ANGIOPLASTY Right 11/19/2017   Procedure: PATCH ANGIOPLASTY;  Surgeon: Maeola Harmanain, Brandon Christopher, MD;  [facility];  Service: Vascular;  Laterality: Right;  . UMBILICAL HERNIA REPAIR     Past Medical History:  Diagnosis Date  . Carotid artery occlusion   . GERD (gastroesophageal reflux disease)   . History of kidney stones   . Hypertension   . Nephrolithiasis   . Stroke [facility])    left arm does not work  . Tobacco abuse    BP (!) 150/86   Pulse (!) 103   Ht 5\' 5"  (1.651 m)   Wt 130 lb (59 kg)   SpO2 98%   BMI 21.63 kg/m   Opioid Risk Score:   Fall Risk Score:  `1  Depression screen PHQ 2/9  Depression screen PHQ 2/9 11/15/2017  Decreased Interest 0  Down, Depressed, Hopeless 0  PHQ - 2 Score 0     Review of Systems  Constitutional: Negative.   HENT: Negative.   Eyes: Negative.   Respiratory: Negative.   Cardiovascular: Negative.   Gastrointestinal: Negative.   Endocrine: Negative.   Genitourinary: Negative.   Musculoskeletal: Positive for arthralgias, gait problem and myalgias.  Skin: Negative.   Allergic/Immunologic: Negative.   Hematological: Negative.   Psychiatric/Behavioral: Negative.   All other systems reviewed and  are negative.     Objective:   Physical Exam Constitutional: No distress . Vital signs reviewed. HENT: Normocephalic.  Atraumatic. Eyes: EOMI. No discharge. Cardiovascular: RRR. No JVD. Respiratory: CTA bilaterally. Normal effort. GI: BS +. Non-distended. Musc: Left shoulder pain with subluxation Gait: Hemiplegic with circumduction Neurological: He is alert and oriented. Left facial weakness with mild dysarthria.  Left upper extremity: Shoulder abduction 2/5, elbow flexion 2/5, elbow extension 2/5, 2/5 wrist extension, hand grip 1+/5  Left lower extremity: 4/5 proximal to distal mAS 3/4 flexors of LUE Skin: Skin is warm and dry.  Psychiatric: Normal mood and behavior.    Assessment & Plan:  53 year old male with history of HTN, Cocaine/ETOH/tobacco abuse presents for hospital follow up for right MCA infarct.   1. Left inattention and left sided weakness with spasticity affecting mobility as well as ability to carry out ADL tasks secondary to right MCA infarct.  Cont HEP  Follow up with Neurology, needs appointment  Cont WHO, encouraged use  Will increase Baclofen to 20 TID  Unable to tolerate driving  Will consider SPRINT in future  Had Botox injection - pt did not follow up and states he cannot afford copays, however, states he will come back for repeat injection   Left FCU: 20 units   Left FCR: 20 units   Left FDS: 30 units   Left FDP: 30 units     Total units: 100   Anxious to leave  2. HTN  Elevated today  Cont meds  States PCP adjusting meds  2. Tobacco/polysubstance abuse:   Continues to smoke/Etoh, discussed with patient again  3. Right ICA stenosis:   Follow up with Vascular, ultrasound to be performed, did not follow up, recomonded again  4. Gait abnormality  Hemiplegic gait  Does not want walker, will order cane

## 2018-04-21 ENCOUNTER — Telehealth: Payer: Self-pay | Admitting: Physical Medicine & Rehabilitation

## 2018-04-21 NOTE — Telephone Encounter (Signed)
Per Henderson Newcomer with [facility], the referral for Home Health has been denied due to patient not being homebound.

## 2018-04-26 ENCOUNTER — Other Ambulatory Visit: Payer: Self-pay | Admitting: Neurology

## 2018-04-28 ENCOUNTER — Other Ambulatory Visit: Payer: Self-pay

## 2018-04-28 MED ORDER — GABAPENTIN 300 MG PO CAPS: 300.0000 mg | ORAL_CAPSULE | Freq: Two times a day (BID) | ORAL | 0 refills | Status: DC

## 2018-04-29 ENCOUNTER — Ambulatory Visit: Payer: 59 | Admitting: Adult Health

## 2018-05-08 ENCOUNTER — Encounter: Payer: 59 | Attending: Physical Medicine & Rehabilitation | Admitting: Physical Medicine & Rehabilitation

## 2018-05-08 ENCOUNTER — Encounter: Payer: Self-pay | Admitting: Physical Medicine & Rehabilitation

## 2018-05-08 VITALS — BP 118/75 | HR 96 | Ht 65.0 in | Wt 139.0 lb

## 2018-05-08 DIAGNOSIS — R531 Weakness: Secondary | ICD-10-CM | POA: Insufficient documentation

## 2018-05-08 DIAGNOSIS — Z8041 Family history of malignant neoplasm of ovary: Secondary | ICD-10-CM | POA: Diagnosis not present

## 2018-05-08 DIAGNOSIS — R269 Unspecified abnormalities of gait and mobility: Secondary | ICD-10-CM | POA: Insufficient documentation

## 2018-05-08 DIAGNOSIS — Z808 Family history of malignant neoplasm of other organs or systems: Secondary | ICD-10-CM | POA: Insufficient documentation

## 2018-05-08 DIAGNOSIS — F17210 Nicotine dependence, cigarettes, uncomplicated: Secondary | ICD-10-CM | POA: Insufficient documentation

## 2018-05-08 DIAGNOSIS — Z833 Family history of diabetes mellitus: Secondary | ICD-10-CM | POA: Diagnosis not present

## 2018-05-08 DIAGNOSIS — Z8042 Family history of malignant neoplasm of prostate: Secondary | ICD-10-CM | POA: Diagnosis not present

## 2018-05-08 DIAGNOSIS — I6521 Occlusion and stenosis of right carotid artery: Secondary | ICD-10-CM | POA: Diagnosis not present

## 2018-05-08 DIAGNOSIS — I63511 Cerebral infarction due to unspecified occlusion or stenosis of right middle cerebral artery: Secondary | ICD-10-CM | POA: Insufficient documentation

## 2018-05-08 DIAGNOSIS — Z801 Family history of malignant neoplasm of trachea, bronchus and lung: Secondary | ICD-10-CM | POA: Diagnosis not present

## 2018-05-08 DIAGNOSIS — G8114 Spastic hemiplegia affecting left nondominant side: Secondary | ICD-10-CM | POA: Diagnosis not present

## 2018-05-08 DIAGNOSIS — Z823 Family history of stroke: Secondary | ICD-10-CM | POA: Diagnosis not present

## 2018-05-08 DIAGNOSIS — I10 Essential (primary) hypertension: Secondary | ICD-10-CM | POA: Insufficient documentation

## 2018-05-08 DIAGNOSIS — Z79899 Other long term (current) drug therapy: Secondary | ICD-10-CM | POA: Insufficient documentation

## 2018-05-08 NOTE — Progress Notes (Signed)
Botox: Procedure Note Patient Name: [name] [name] DOB: 02/10/1966 MRN: [id number]  Date: 05/08/2018  Procedure: Botulinum toxin administration Guidance: EMG Diagnosis: Left spastic hemiparesis Attending: Maryla Morrow, MD   Informed consent: Risks, benefits & options of the procedure are explained to the patient (and/or family). The patient elects to proceed with procedure. Risks include but are not limited to weakness, respiratory distress, dry mouth, ptosis, antibody formation, worsening of some areas of function. Benefits include decreased abnormal muscle tone, improved hygiene and positioning, decreased skin breakdown and, in some cases, decreased pain. Options include conservative management with oral antispasticity agents, phenol chemodenervation of nerve or at motor nerve branches. More invasive options include intrathecal balcofen adminstration for appropriate candidates. Surgical options may include tendon lengthening or transposition or, rarely, dorsal rhizotomy.   History/Physical Examination: 53 y.o. male with history of HTN, Cocaine/ETOH/tobacco abuse presents for hospital follow up for right MCA infarct.   mAS  Left elbow flexors 1+/4   Left wrist flexors 1+/4   Left finger flexors 1+/4     Previous Treatments: Oral therapy, Therapy/Range of motion Indication for guidance: Target active muscules  Procedure: Botulinum toxin was mixed with preservative free saline with a dilution of 1cc to 100 units. Targeted limb and muscles were identified. The skin was prepped with alcohol swabs and placement of needle tip in targeted muscle was confirmed using appropriate guidance. Prior to injection, positioning of needle tip outside of blood vessel was determined by pulling back on syringe plunger.  MUSCLE UNITS  Left biceps: 20 units Left FCU: 20 units Left FCR: 20 units Left FDS: 20 units Left FDP: 20 units  Total units used: 100  Complications: None  Plan: RTC 6 weeks  Hiroko Tregre Anil  Rashonda Warrior 10:25 AM

## 2018-05-11 ENCOUNTER — Other Ambulatory Visit: Payer: Self-pay | Admitting: Physical Medicine & Rehabilitation

## 2018-06-11 ENCOUNTER — Other Ambulatory Visit: Payer: Self-pay | Admitting: Physical Medicine & Rehabilitation

## 2018-06-13 ENCOUNTER — Encounter (HOSPITAL_COMMUNITY): Payer: Managed Care, Other (non HMO)

## 2018-06-13 ENCOUNTER — Ambulatory Visit: Payer: Managed Care, Other (non HMO) | Admitting: Vascular Surgery

## 2018-06-19 ENCOUNTER — Ambulatory Visit: Payer: 59 | Admitting: Physical Medicine & Rehabilitation

## 2018-06-20 ENCOUNTER — Ambulatory Visit: Payer: Managed Care, Other (non HMO) | Admitting: Vascular Surgery

## 2018-06-20 ENCOUNTER — Encounter (HOSPITAL_COMMUNITY): Payer: Managed Care, Other (non HMO)

## 2018-07-06 ENCOUNTER — Other Ambulatory Visit: Payer: Self-pay | Admitting: Physical Medicine & Rehabilitation

## 2018-07-16 ENCOUNTER — Other Ambulatory Visit: Payer: Self-pay | Admitting: Physical Medicine & Rehabilitation

## 2018-07-16 ENCOUNTER — Telehealth: Payer: Self-pay

## 2018-07-16 NOTE — Telephone Encounter (Signed)
Spoke with the patient and he has given verbal consent to file his insurance and to do a doxy.me visit with Shanda Bumps. Mobile number and carrier have been confirmed and sent.   Text: [phone] Development worker, international aid)

## 2018-07-17 NOTE — Telephone Encounter (Signed)
Needs a follow up appointment. Thanks.

## 2018-07-21 ENCOUNTER — Other Ambulatory Visit: Payer: Self-pay

## 2018-07-21 ENCOUNTER — Ambulatory Visit (INDEPENDENT_AMBULATORY_CARE_PROVIDER_SITE_OTHER): Payer: Self-pay | Admitting: Adult Health

## 2018-07-21 DIAGNOSIS — Z0289 Encounter for other administrative examinations: Secondary | ICD-10-CM

## 2018-07-25 ENCOUNTER — Other Ambulatory Visit: Payer: Self-pay | Admitting: Neurology

## 2018-08-07 ENCOUNTER — Encounter: Payer: Self-pay | Admitting: Adult Health

## 2018-08-11 ENCOUNTER — Other Ambulatory Visit: Payer: Self-pay | Admitting: Neurology

## 2018-08-11 ENCOUNTER — Other Ambulatory Visit: Payer: Self-pay | Admitting: Physical Medicine & Rehabilitation

## 2018-08-13 NOTE — Telephone Encounter (Signed)
Pt was sent a mychart message to schedule follow up appt.

## 2018-08-13 NOTE — Telephone Encounter (Signed)
Please refill for an additional 3 months but advised patient that he will need to schedule follow-up visit prior to any additional refills

## 2018-10-15 ENCOUNTER — Other Ambulatory Visit: Payer: Self-pay

## 2018-10-15 DIAGNOSIS — I6521 Occlusion and stenosis of right carotid artery: Secondary | ICD-10-CM

## 2018-10-24 ENCOUNTER — Ambulatory Visit: Payer: Self-pay | Admitting: Vascular Surgery

## 2018-10-24 ENCOUNTER — Encounter (HOSPITAL_COMMUNITY): Payer: Self-pay

## 2018-11-15 ENCOUNTER — Other Ambulatory Visit: Payer: Self-pay | Admitting: Physical Medicine & Rehabilitation

## 2018-11-17 ENCOUNTER — Encounter: Payer: Self-pay | Admitting: *Deleted

## 2018-11-17 NOTE — Telephone Encounter (Signed)
Opened in error

## 2018-11-21 ENCOUNTER — Ambulatory Visit: Payer: Self-pay | Admitting: Vascular Surgery

## 2018-11-21 ENCOUNTER — Ambulatory Visit (HOSPITAL_COMMUNITY): Payer: Self-pay | Attending: Vascular Surgery

## 2018-11-30 ENCOUNTER — Other Ambulatory Visit: Payer: Self-pay | Admitting: Physical Medicine & Rehabilitation

## 2019-01-26 IMAGING — CT CT HEAD W/O CM
4 series · 16 of 47 positions shown, 18 images · non-contrast
Comparison: 07/28/2017

CLINICAL DATA: Stroke follow-up

EXAM:
CT HEAD WITHOUT CONTRAST
TECHNIQUE: Contiguous axial images were obtained from the base of the skull
through the vertex without intravenous contrast.

[Series 3: head without · axial · non-contrast · 0.44mm/px · z∈[-115,-0]mm · 7 of 31 slices shown, 9 images]
[im 4/31  brain]
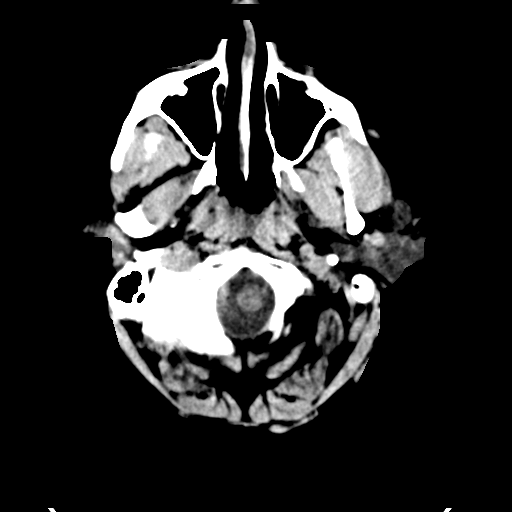
[im 4/31  bone]
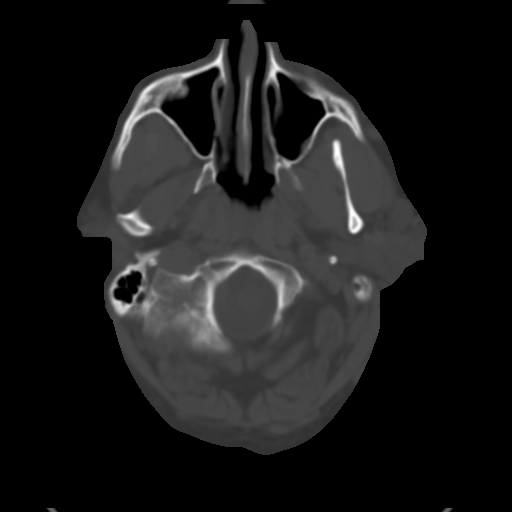
[im 8/31  brain]
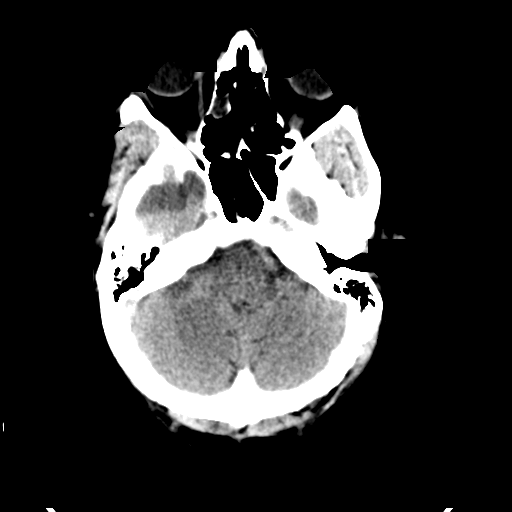
[im 12/31  brain]
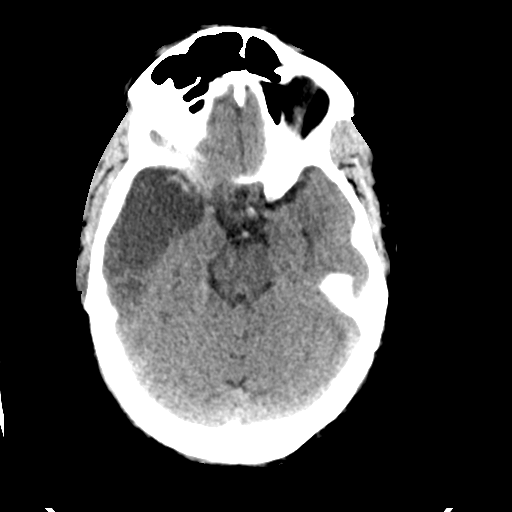
[im 16/31  brain]
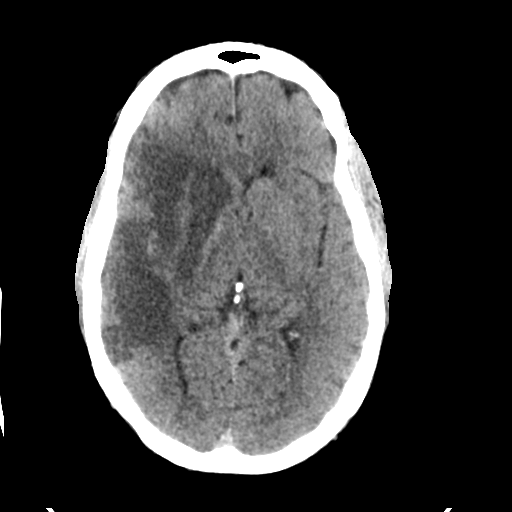
[im 19/31  brain]
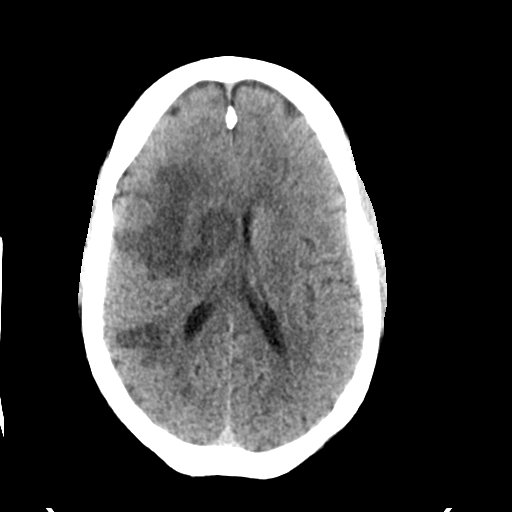
[im 19/31  bone]
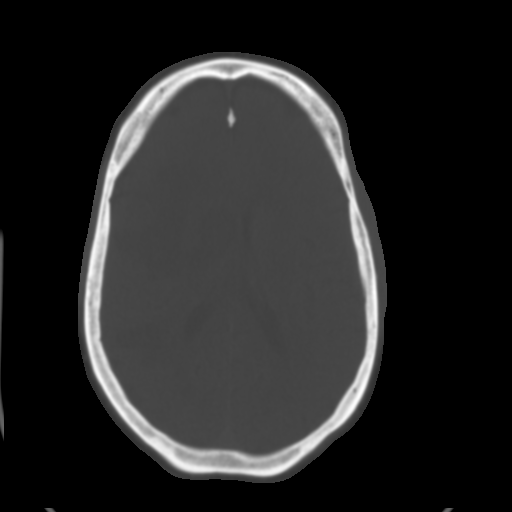
[im 23/31  brain]
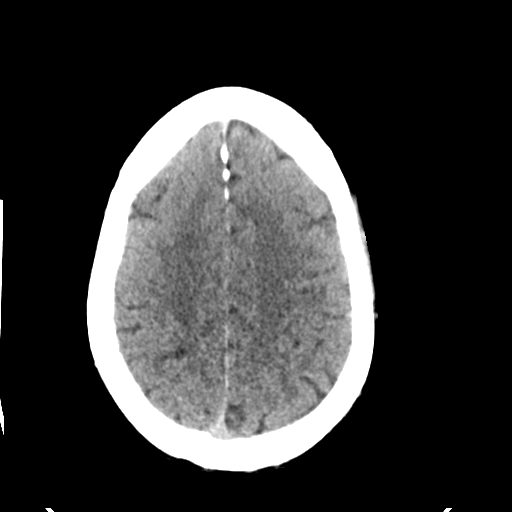
[im 27/31  brain]
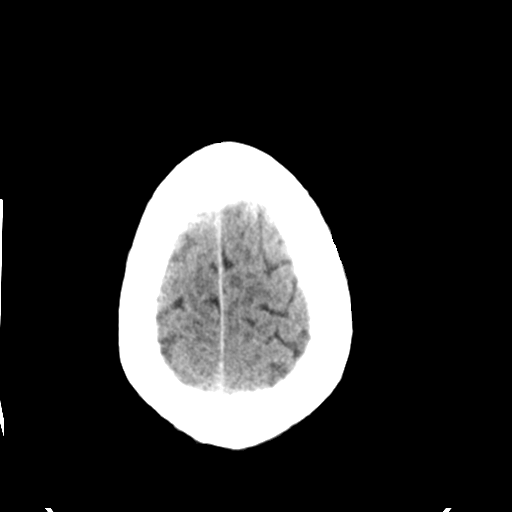

[Series 4: head bone · axial · 0.44mm/px · z∈[-116,-84]mm · 3 of 78 slices shown]
[im 8/78  bone]
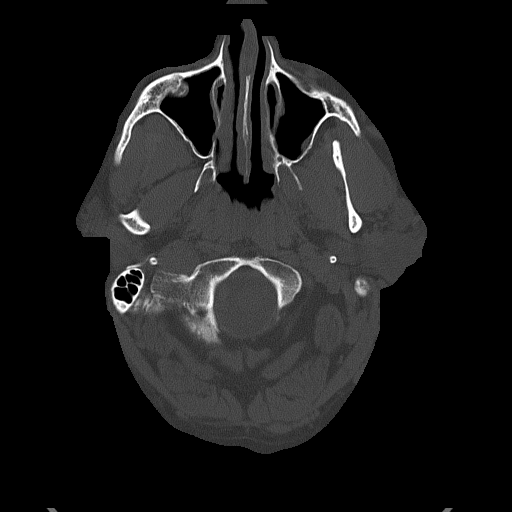
[im 16/78  bone]
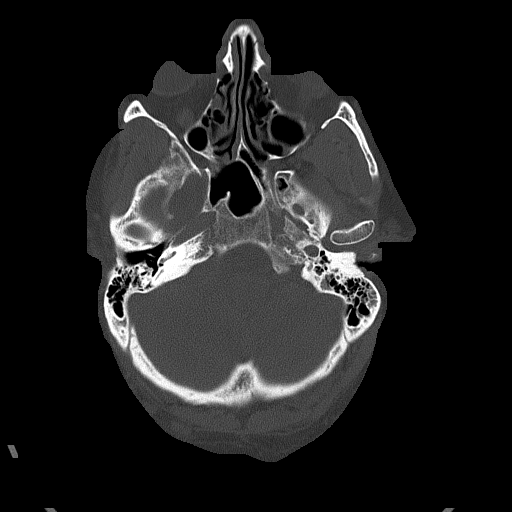
[im 24/78  bone]
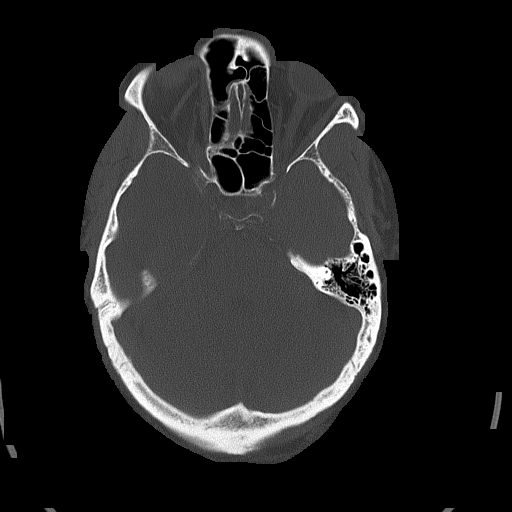

[Series 5: head without cor · coronal · non-contrast · 0.31mm/px · 3 of 75 slices shown]
[im 25/75  brain]
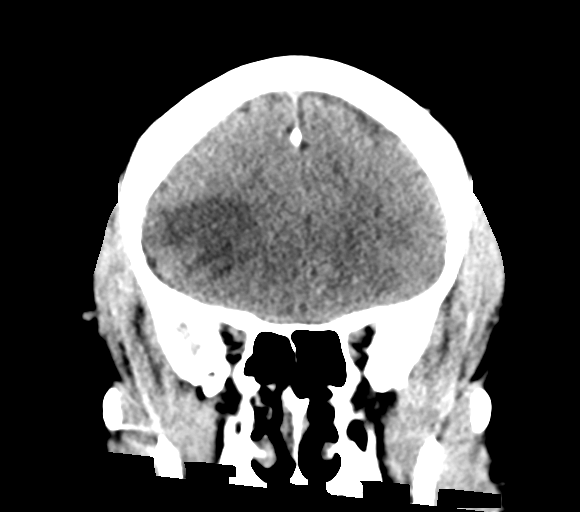
[im 33/75  brain]
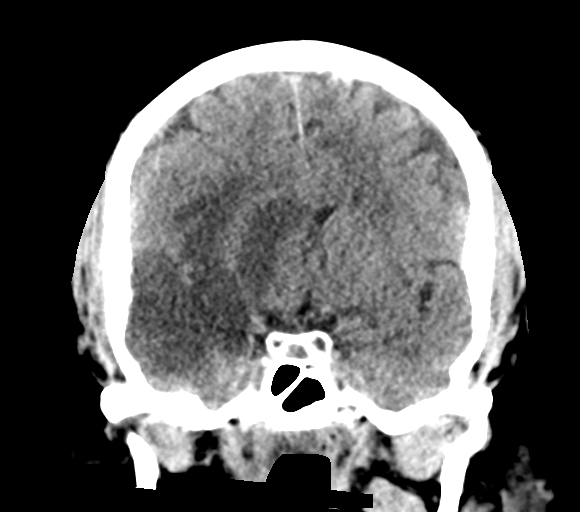
[im 42/75  brain]
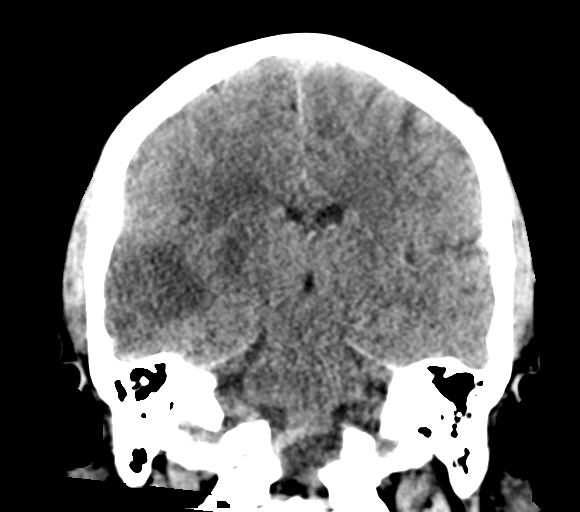

[Series 6: head without sag · sagittal · non-contrast · 0.31mm/px · 3 of 56 slices shown]
[im 19/56  brain]
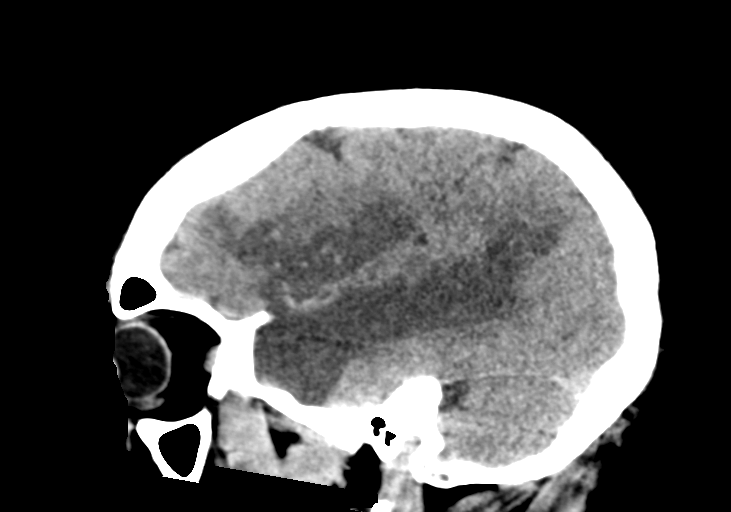
[im 28/56  brain]
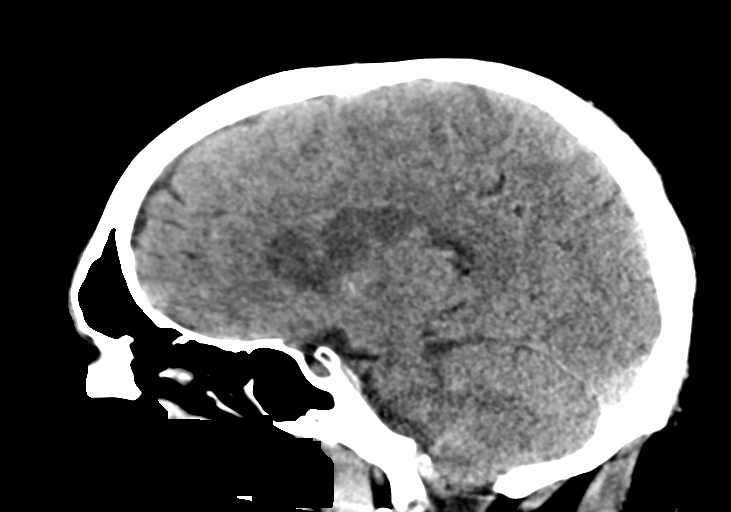
[im 37/56  brain]
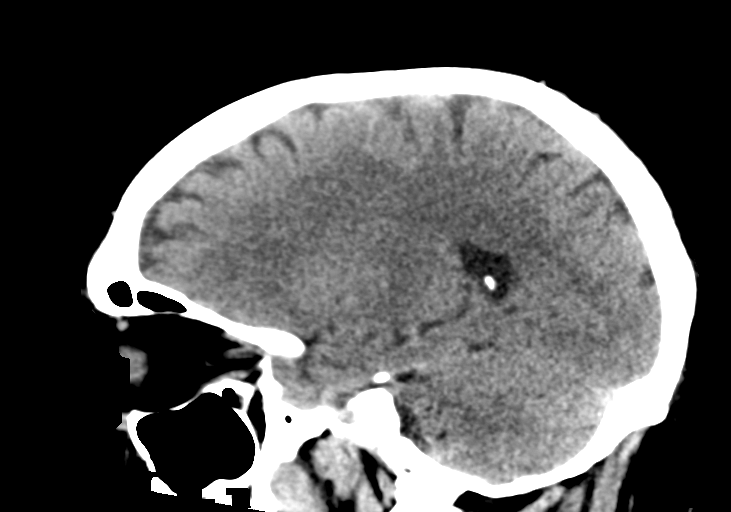

[16 of 47 positions shown; findings below may reference images not displayed]

FINDINGS: Brain: There is no mass, hemorrhage or extra-axial collection. Large
right MCA territory infarct is again demonstrated with unchanged
degree of cytotoxic edema causing mass effect on the right lateral
ventricle. 4 mm of leftward midline shift. No hydrocephalus.

Vascular: There is hyperdensity within the right MCA at the site of
known thrombus.

Skull: The visualized skull base, calvarium and extracranial soft
tissues are normal.

Sinuses/Orbits: No fluid levels or advanced mucosal thickening of
the visualized paranasal sinuses. No mastoid or middle ear effusion.
The orbits are normal.
IMPRESSION: Unchanged examination of large right MCA territory infarct without
acute hemorrhage.

## 2019-05-14 DIAGNOSIS — Z0289 Encounter for other administrative examinations: Secondary | ICD-10-CM
